# Patient Record
Sex: Male | Born: 1955 | Race: White | Hispanic: No | Marital: Married | Smoking: Never smoker
Health system: Southern US, Community
[De-identification: ages and names within clinical notes are randomized; demographics above are authoritative.]

## PROBLEM LIST (undated history)

## (undated) DIAGNOSIS — Z973 Presence of spectacles and contact lenses: Secondary | ICD-10-CM

## (undated) DIAGNOSIS — E785 Hyperlipidemia, unspecified: Secondary | ICD-10-CM

## (undated) DIAGNOSIS — D126 Benign neoplasm of colon, unspecified: Secondary | ICD-10-CM

## (undated) DIAGNOSIS — R011 Cardiac murmur, unspecified: Secondary | ICD-10-CM

## (undated) DIAGNOSIS — C801 Malignant (primary) neoplasm, unspecified: Secondary | ICD-10-CM

## (undated) DIAGNOSIS — K5792 Diverticulitis of intestine, part unspecified, without perforation or abscess without bleeding: Secondary | ICD-10-CM

## (undated) DIAGNOSIS — G4700 Insomnia, unspecified: Secondary | ICD-10-CM

## (undated) DIAGNOSIS — F988 Other specified behavioral and emotional disorders with onset usually occurring in childhood and adolescence: Secondary | ICD-10-CM

## (undated) DIAGNOSIS — H409 Unspecified glaucoma: Secondary | ICD-10-CM

## (undated) DIAGNOSIS — H9190 Unspecified hearing loss, unspecified ear: Secondary | ICD-10-CM

## (undated) DIAGNOSIS — T7840XA Allergy, unspecified, initial encounter: Secondary | ICD-10-CM

## (undated) DIAGNOSIS — J302 Other seasonal allergic rhinitis: Secondary | ICD-10-CM

## (undated) DIAGNOSIS — N433 Hydrocele, unspecified: Secondary | ICD-10-CM

## (undated) DIAGNOSIS — W19XXXA Unspecified fall, initial encounter: Secondary | ICD-10-CM

## (undated) HISTORY — DX: Diverticulitis of intestine, part unspecified, without perforation or abscess without bleeding: K57.92

## (undated) HISTORY — DX: Unspecified fall, initial encounter: W19.XXXA

## (undated) HISTORY — DX: Other specified behavioral and emotional disorders with onset usually occurring in childhood and adolescence: F98.8

## (undated) HISTORY — DX: Unspecified glaucoma: H40.9

## (undated) HISTORY — DX: Allergy, unspecified, initial encounter: T78.40XA

## (undated) HISTORY — DX: Insomnia, unspecified: G47.00

## (undated) HISTORY — DX: Benign neoplasm of colon, unspecified: D12.6

## (undated) HISTORY — DX: Cardiac murmur, unspecified: R01.1

## (undated) HISTORY — DX: Other seasonal allergic rhinitis: J30.2

## (undated) HISTORY — DX: Unspecified hearing loss, unspecified ear: H91.90

## (undated) HISTORY — DX: Hyperlipidemia, unspecified: E78.5

## (undated) HISTORY — PX: FRACTURE SURGERY: SHX138

## (undated) HISTORY — PX: COLON SURGERY: SHX602

## (undated) HISTORY — DX: Hydrocele, unspecified: N43.3

## (undated) HISTORY — PX: POLYPECTOMY: SHX149

## (undated) HISTORY — PX: WISDOM TOOTH EXTRACTION: SHX21

---

## 2001-12-07 ENCOUNTER — Encounter: Payer: Self-pay | Admitting: Internal Medicine

## 2004-12-24 ENCOUNTER — Ambulatory Visit: Payer: Self-pay | Admitting: Family Medicine

## 2005-12-12 ENCOUNTER — Ambulatory Visit: Payer: Self-pay | Admitting: Family Medicine

## 2009-02-16 DIAGNOSIS — W19XXXA Unspecified fall, initial encounter: Secondary | ICD-10-CM

## 2009-02-16 HISTORY — DX: Unspecified fall, initial encounter: W19.XXXA

## 2009-03-07 ENCOUNTER — Inpatient Hospital Stay (HOSPITAL_COMMUNITY): Admission: EM | Admit: 2009-03-07 | Discharge: 2009-03-10 | Payer: Self-pay

## 2009-03-08 ENCOUNTER — Ambulatory Visit: Payer: Self-pay | Admitting: Physical Medicine & Rehabilitation

## 2009-04-19 ENCOUNTER — Encounter: Payer: Self-pay | Admitting: Internal Medicine

## 2009-05-17 ENCOUNTER — Encounter: Payer: Self-pay | Admitting: Internal Medicine

## 2009-06-07 ENCOUNTER — Encounter: Payer: Self-pay | Admitting: Internal Medicine

## 2009-06-13 ENCOUNTER — Encounter: Admission: RE | Admit: 2009-06-13 | Discharge: 2009-08-09 | Payer: Self-pay | Admitting: Orthopedic Surgery

## 2009-07-17 ENCOUNTER — Encounter: Payer: Self-pay | Admitting: Internal Medicine

## 2009-10-02 ENCOUNTER — Ambulatory Visit: Payer: Self-pay | Admitting: Family Medicine

## 2009-11-18 HISTORY — PX: HIP SURGERY: SHX245

## 2009-11-18 HISTORY — PX: OTHER SURGICAL HISTORY: SHX169

## 2009-11-18 HISTORY — PX: WRIST SURGERY: SHX841

## 2009-12-07 ENCOUNTER — Ambulatory Visit: Payer: Self-pay | Admitting: Internal Medicine

## 2009-12-08 ENCOUNTER — Encounter (INDEPENDENT_AMBULATORY_CARE_PROVIDER_SITE_OTHER): Payer: Self-pay | Admitting: *Deleted

## 2009-12-10 LAB — CONVERTED CEMR LAB
ALT: 34 units/L (ref 0–53)
AST: 31 units/L (ref 0–37)
BUN: 18 mg/dL (ref 6–23)
Cholesterol: 219 mg/dL — ABNORMAL HIGH (ref 0–200)
Creatinine, Ser: 1 mg/dL (ref 0.4–1.5)
Eosinophils Relative: 0.7 % (ref 0.0–5.0)
GFR calc non Af Amer: 82.63 mL/min (ref >60)
HCT: 46.9 % (ref 39.0–52.0)
HDL: 70.5 mg/dL (ref >39.00)
Monocytes Relative: 9.1 % (ref 3.0–12.0)
Neutrophils Relative %: 55.9 % (ref 43.0–77.0)
Platelets: 233 10*3/uL (ref 150.0–400.0)
Potassium: 4.3 meq/L (ref 3.5–5.1)
Triglycerides: 59 mg/dL (ref 0.0–149.0)
WBC: 4.7 10*3/uL (ref 4.5–10.5)

## 2009-12-19 ENCOUNTER — Ambulatory Visit (HOSPITAL_COMMUNITY): Admission: RE | Admit: 2009-12-19 | Discharge: 2009-12-19 | Payer: Self-pay | Admitting: Internal Medicine

## 2009-12-19 ENCOUNTER — Encounter: Payer: Self-pay | Admitting: Internal Medicine

## 2009-12-19 ENCOUNTER — Ambulatory Visit: Payer: Self-pay | Admitting: Internal Medicine

## 2009-12-19 ENCOUNTER — Ambulatory Visit: Payer: Self-pay

## 2009-12-22 ENCOUNTER — Encounter (INDEPENDENT_AMBULATORY_CARE_PROVIDER_SITE_OTHER): Payer: Self-pay | Admitting: *Deleted

## 2009-12-22 ENCOUNTER — Encounter (INDEPENDENT_AMBULATORY_CARE_PROVIDER_SITE_OTHER): Payer: Self-pay

## 2009-12-26 ENCOUNTER — Encounter (INDEPENDENT_AMBULATORY_CARE_PROVIDER_SITE_OTHER): Payer: Self-pay | Admitting: *Deleted

## 2010-01-01 ENCOUNTER — Encounter (INDEPENDENT_AMBULATORY_CARE_PROVIDER_SITE_OTHER): Payer: Self-pay

## 2010-01-02 ENCOUNTER — Ambulatory Visit: Payer: Self-pay | Admitting: Internal Medicine

## 2010-01-16 ENCOUNTER — Ambulatory Visit: Payer: Self-pay | Admitting: Internal Medicine

## 2010-01-16 LAB — HM COLONOSCOPY

## 2010-01-18 ENCOUNTER — Encounter: Payer: Self-pay | Admitting: Internal Medicine

## 2010-12-18 NOTE — Letter (Signed)
Summary: Results Follow up Letter  [facility]  [address] [place], [place] [address]   Phone: [phone]  Fax: [phone]    12/22/2009 MRN: [id number]  [name] [address] [place], [place]  [address]  Dear [name],  The following are the results of your recent test(s):  Test         Result    Pap Smear:        Normal _____  Not Normal _____ Comments: ______________________________________________________ Cholesterol: LDL(Bad cholesterol):         Your goal is less than:         HDL (Good cholesterol):       Your goal is more than: Comments:  ______________________________________________________ Mammogram:        Normal _____  Not Normal _____ Comments:  ___________________________________________________________________ Hemoccult:        Normal _____  Not normal _______ Comments:    Your ECHO was normal. Please call me if you have any questions or concerns. Alena Bills [phone] ext [phone]

## 2010-12-18 NOTE — Letter (Signed)
Summary: Previsit letter  [facility]  [address] [place], [place] [address]   Phone: [phone]  Fax: [phone]       12/26/2009 MRN: [id number]  [name] [address] [place], [place]  [address]  Dear [name],  Welcome to the Gastroenterology Division at [facility].    You are scheduled to see a nurse for your pre-procedure visit on 01/02/2010 at 1:30PM on the 3rd floor at [facility], [address]  We ask that you try to arrive at our office 15 minutes prior to your appointment time to allow for check-in.  Your nurse visit will consist of discussing your medical and surgical history, your immediate family medical history, and your medications.    Please bring a complete list of all your medications or, if you prefer, bring the medication bottles and we will list them.  We will need to be aware of both prescribed and over the counter drugs.  We will need to know exact dosage information as well.  If you are on blood thinners (Coumadin, Plavix, Aggrenox, Ticlid, etc.) please call our office today/prior to your appointment, as we need to consult with your physician about holding your medication.   Please be prepared to read and sign documents such as consent forms, a financial agreement, and acknowledgement forms.  If necessary, and with your consent, a friend or relative is welcome to sit-in on the nurse visit with you.  Please bring your insurance card so that we may make a copy of it.  If your insurance requires a referral to see a specialist, please bring your referral form from your primary care physician.  No co-pay is required for this nurse visit.     If you cannot keep your appointment, please call [phone] to cancel or reschedule prior to your appointment date.  This allows [place] the opportunity to schedule an appointment for another patient in need of care.    Thank you for choosing [facility] for your medical  needs.  We appreciate the opportunity to care for you.  Please visit [place] at our website  to learn more about our practice.                     Sincerely.                                                                                                                   The Gastroenterology Division

## 2010-12-18 NOTE — Letter (Signed)
Summary: [facility] Instructions  [facility]  [address] [place], [place] [address]   Phone: [phone]  Fax: [phone]       [name]    11-Mar-1956    MRN: [id number]        Procedure Day /Date: 01/16/10  Tuesday     Arrival Time:  1pm      Procedure Time: 2pm     Location of Procedure:                    _ x_  [facility] (4th Floor)   PREPARATION FOR COLONOSCOPY WITH MOVIPREP   Starting 5 days prior to your procedure _2/24/11 _ do not eat nuts, seeds, popcorn, corn, beans, peas,  salads, or any raw vegetables.  Do not take any fiber supplements (e.g. Metamucil, Citrucel, and Benefiber).  THE DAY BEFORE YOUR PROCEDURE         DATE:  01/16/10   DAY:  Monday  1.  Drink clear liquids the entire day-NO SOLID FOOD  2.  Do not drink anything colored red or purple.  Avoid juices with pulp.  No orange juice.  3.  Drink at least 64 oz. (8 glasses) of fluid/clear liquids during the day to prevent dehydration and help the prep work efficiently.  CLEAR LIQUIDS INCLUDE: Water Jello Ice Popsicles Tea (sugar ok, no milk/cream) Powdered fruit flavored drinks Coffee (sugar ok, no milk/cream) Gatorade Juice: apple, white grape, white cranberry  Lemonade Clear bullion, consomm, broth Carbonated beverages (any kind) Strained chicken noodle soup Hard Candy                             4.  In the morning, mix first dose of MoviPrep solution:    Empty 1 Pouch A and 1 Pouch B into the disposable container    Add lukewarm drinking water to the top line of the container. Mix to dissolve    Refrigerate (mixed solution should be used within 24 hrs)  5.  Begin drinking the prep at 5:00 p.m. The MoviPrep container is divided by 4 marks.   Every 15 minutes drink the solution down to the next mark (approximately 8 oz) until the full liter is complete.   6.  Follow completed prep with 16 oz of clear liquid of your choice (Nothing red or purple).  Continue to  drink clear liquids until bedtime.  7.  Before going to bed, mix second dose of MoviPrep solution:    Empty 1 Pouch A and 1 Pouch B into the disposable container    Add lukewarm drinking water to the top line of the container. Mix to dissolve    Refrigerate  THE DAY OF YOUR PROCEDURE      DATE:  01/16/10  DAY:  Tuesday  Beginning at   9:00 a.m. (5 hours before procedure):         1. Every 15 minutes, drink the solution down to the next mark (approx 8 oz) until the full liter is complete.  2. Follow completed prep with 16 oz. of clear liquid of your choice.    3. You may drink clear liquids until  12 noon   (2 HOURS BEFORE PROCEDURE).   MEDICATION INSTRUCTIONS  Unless otherwise instructed, you should take regular prescription medications with a small sip of water   as early as possible the morning of your procedure.  OTHER INSTRUCTIONS  You will need a responsible adult at least 56 years of age to accompany you and drive you home.   This person must remain in the waiting room during your procedure.  Wear loose fitting clothing that is easily removed.  Leave jewelry and other valuables at home.  However, you may wish to bring a book to read or  an iPod/MP3 player to listen to music as you wait for your procedure to start.  Remove all body piercing jewelry and leave at home.  Total time from sign-in until discharge is approximately 2-3 hours.  You should go home directly after your procedure and rest.  You can resume normal activities the  day after your procedure.  The day of your procedure you should not:   Drive   Make legal decisions   Operate machinery   Drink alcohol   Return to work  You will receive specific instructions about eating, activities and medications before you leave.    The above instructions have been reviewed and explained to me by   Ulis Rias RN  January 02, 2010 1:47 PM     I fully understand and can verbalize these  instructions _____________________________ Date _________

## 2010-12-18 NOTE — Letter (Signed)
Summary: [facility] No Show Letter  [facility]  [address] [place], [place] [address]   Phone: [phone]  Fax: [phone]      December 22, 2009 MRN: [id number]   MONIQUE GIFT [address] [place], [place]  [address]      You were scheduled for an endoscopic procedure with Dr. Yancey Flemings   on  Tuesday 01/02/10   at the [facility] but you did not keep the appointment.    Your provider recommended this procedure for the benefit of your health.  It is very important that you reschedule it.  Failure to do so may be to the detriment of your health.  Please call us at [phone] and we will be happy to assist you with rescheduling.    If you were referred for this procedure by another physician/provider, we will notify him/her that you did not keep your appointment.   Sincerely, Doristine Church, RN  [facility]  Appended Document: [facility] No Show Letter December 22, 2009 MRN: [id number]   VALMORE ARABIE [address] [place] [phone]      You were scheduled for an endoscopic procedure with Dr. Yancey Flemings   on  Tuesday 01/02/10   at the [facility] but you did not keep the appointment.    Your provider recommended this procedure for the benefit of your health.  It is very important that you reschedule it.  Failure to do so may be to the detriment of your health.  Please call us at [phone] and we will be happy to assist you with rescheduling.    If you were referred for this procedure by another physician/provider, we will notify him/her that you did not keep your appointment.   Sincerely, Doristine Church, RN  [facility]   CHANGE OF ADDRESS

## 2010-12-18 NOTE — Miscellaneous (Signed)
Summary: [facility] previsit  Clinical Lists Changes  Medications: Added new medication of MOVIPREP 100 GM  SOLR (PEG-KCL-NACL-NASULF-NA ASC-C) As per prep instructions. - Signed Rx of MOVIPREP 100 GM  SOLR (PEG-KCL-NACL-NASULF-NA ASC-C) As per prep instructions.;  #1 x 0;  Signed;  Entered by: Ulis Rias RN;  Authorized by: Hilarie Fredrickson MD;  Method used: Electronically to [id number] [address] [place], [place], [place]  [address], Ph: [phone], Fax: [phone] Observations: Added new observation of NKA: T (01/02/2010 13:12)    Prescriptions: MOVIPREP 100 GM  SOLR (PEG-KCL-NACL-NASULF-NA ASC-C) As per prep instructions.  #1 x 0   Entered by:   Ulis Rias RN   Authorized by:   Hilarie Fredrickson MD   Signed by:   Ulis Rias RN on 01/02/2010   Method used:   Electronically to        [id number] (retail)       [address] [place]       [place], [place]  [address]       Ph: [phone]       Fax: [phone]   RxID:   [id number]

## 2010-12-18 NOTE — Assessment & Plan Note (Signed)
Summary: CPX AND FASTING LABS////SPH  Flu Vaccine Consent Questions     Do you have a history of severe allergic reactions to this vaccine? no    Any prior history of allergic reactions to egg and/or gelatin? no    Do you have a sensitivity to the preservative Thimersol? no    Do you have a past history of Guillan-Barre Syndrome? no    Do you currently have an acute febrile illness? no    Have you ever had a severe reaction to latex? no    Vaccine information given and explained to patient? yes    Are you currently pregnant? no    Lot Number:[id number]   Exp Date:05/17/2010   Site Given  rght Deltoid IM Shary Decamp  December 07, 2009 9:45 AM   Vital Signs:  Patient profile:   55 year old male Height:      71.75 inches Weight:      189.8 pounds Pulse rate:   76 / minute BP sitting:   120 / 72  Vitals Entered By: Dena Billet  CC: cpx, fasting Is Patient Diabetic? 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 CV:  Denies chest pain or discomfort and swelling of feet. Resp:  Denies cough and shortness of breath. GI:  Denies bloody stools, nausea, and vomiting. GU:  Denies dysuria, hematuria, urinary frequency, and urinary hesitancy.  Physical Exam  General:  alert, well-developed, and well-nourished.   Neck:  no thyromegaly.   Lungs:  normal respiratory effort, no intercostal retractions, no accessory muscle use, and normal breath sounds.   Heart:  normal rate, regular rhythm, no murmur, and no gallop.   Abdomen:  soft, non-tender, no distention, no masses, no guarding, and no rigidity.   Rectal:  No external abnormalities noted. Normal sphincter tone. No rectal masses or tenderness. Prostate:  Prostate gland firm and smooth, no enlargement, nodularity, tenderness, mass, asymmetry or induration.   Impression & Recommendations:  Problem # 1:  ROUTINE GENERAL MEDICAL EXAM@HEALTH  CARE FACL (ICD-V70.0) continue healthy lifestyle Td 2010 flu shot today Cscope-- remote, "in college", interested in one ----> referal   Orders: Venipuncture ([address]) TLB-BMP (Basic Metabolic Panel-BMET) (80048-METABOL) TLB-CBC Platelet - w/Differential (85025-CBCD) TLB-ALT (SGPT) (84460-ALT) TLB-AST (SGOT) (84450-SGOT) TLB-Lipid Panel (80061-LIPID) TLB-TSH (Thyroid Stimulating Hormone) (84443-TSH) TLB-PSA (Prostate Specific Antigen) (84153-PSA) Gastroenterology Referral (GI)  Problem # 2:  MITRAL INSUFFICIENCY (ICD-396.3)  h/o MR ECHO 2003 "thickened and somehow myxomatous MV , mild MR" will repeat a ECHO  although no obvious murmur today  Orders: Cardiology Referral (Cardiology)  Complete Medication List: 1)  Adderall 30 Mg Tabs (Amphetamine-dextroamphetamine) .[name].. 1 by mouth daily 2)  Trazodone Hcl 100 Mg Tabs (Trazodone hcl) .... 1/2 as needed sleep  Other Orders: Admin 1st Vaccine ([address]) Flu Vaccine 70yrs + ([address])  Patient  Instructions: 1)  Please schedule a follow-up appointment in 1 year.    Immunization History:  Tetanus/Td Immunization History:    Tetanus/Td:  tdap (02/16/2009)

## 2010-12-18 NOTE — Letter (Signed)
Summary: Patient Notice- Polyp Results  [facility]  [address] [place], [place] [address]   Phone: [phone]  Fax: [phone]        January 18, 2010 MRN: [id number]    [name] [address] [place], [place]  [address]    Dear [name],  I am pleased to inform you that the colon polyp(s) removed during your recent colonoscopy was (were) found to be benign (no cancer detected) upon pathologic examination.  I recommend you have a repeat colonoscopy examination in 3 years to look for recurrent polyps, as having colon polyps increases your risk for having recurrent polyps or even colon cancer in the future.  Should you develop new or worsening symptoms of abdominal pain, bowel habit changes or bleeding from the rectum or bowels, please schedule an evaluation with either your primary care physician or with me.  Additional information/recommendations:  __ No further action with gastroenterology is needed at this time. Please      follow-up with your primary care physician for your other healthcare      needs.  Please call us if you are having persistent problems or have questions about your condition that have not been fully answered at this time.  Sincerely,  Hilarie Fredrickson MD  This letter has been electronically signed by your physician.  Appended Document: Patient Notice- Polyp Results letter mailed 3.7.11

## 2010-12-18 NOTE — Procedures (Signed)
Summary: Colonoscopy  Patient: [name] Note: All result statuses are Final unless otherwise noted.  Tests: (1) Colonoscopy (COL)   COL Colonoscopy           DONE     [facility]     [address]     [place], [place]  [address]           COLONOSCOPY PROCEDURE REPORT           PATIENT:  Darryl, Torres  MR#:  [id number]     BIRTHDATE:  June 01, 1956, 53 yrs. old  GENDER:  male           ENDOSCOPIST:  Wilhemina Bonito. Eda Keys, MD     Referred by:  Willow Ora, M.D.           PROCEDURE DATE:  01/16/2010     PROCEDURE:  Colonoscopy with snare polypectomy x 6     ASA CLASS:  Class II     INDICATIONS:  Routine Risk Screening           MEDICATIONS:   Fentanyl 75 mcg IV, Versed 9 mg IV           DESCRIPTION OF PROCEDURE:   After the risks benefits and     alternatives of the procedure were thoroughly explained, informed     consent was obtained.  Digital rectal exam was performed and     revealed no abnormalities.   The LB [id number] endoscope     was introduced through the anus and advanced to the cecum, which     was identified by both the appendix and ileocecal valve, without     limitations.Time to cecum = 3:20 min. The quality of the prep was     excellent, using MoviPrep.  The instrument was then slowly     withdrawn (time = 19:05 min) as the colon was fully examined.     <<PROCEDUREIMAGES>>           FINDINGS:  There were multiple (6) polyps identified throughout     the colon and removed. 5 Polyps (cecal 1mm, ascending 39mm,3mm,     transverse 7mm, and descending 4mm) were snared without cautery.     Retrieval was successful. An 11mm pedunculated sigmoid colon polyp     was removed with snare cautery and retrieved.  Moderate     diverticulosis was found in the sigmoid colon.  This was otherwise     a normal examination of the colon.   Retroflexed views in the     rectum revealed no abnormalities.    The scope was then withdrawn     from the patient and the procedure  completed.           COMPLICATIONS:  None           ENDOSCOPIC IMPRESSION:     1) Polyps, multiple (6) - removed     2) Moderate diverticulosis in the sigmoid colon     3) Otherwise normal examination           RECOMMENDATIONS:     1) Follow up colonoscopy in 3 years           ______________________________     Wilhemina Bonito. Eda Keys, MD           CC:  Willow Ora, MD; The Patient           n.     eSIGNED:   Wilhemina Bonito. Eda Keys at  01/16/2010 03:03 PM           Harrison Mons, [id number]  Note: An exclamation mark (!) indicates a result that was not dispersed into the flowsheet. Document Creation Date: 01/16/2010 3:04 PM _______________________________________________________________________  (1) Order result status: Final Collection or observation date-time: 01/16/2010 14:53 Requested date-time:  Receipt date-time:  Reported date-time:  Referring Physician:   Ordering Physician: Fransico Setters [phone]) Specimen Source:  Source: Launa Grill Order Number: [id number] Lab site:   Appended Document: Colonoscopy recall in 3 yrs/01-2013     Procedures Next Due Date:    Colonoscopy: 01/2013

## 2010-12-18 NOTE — Letter (Signed)
Summary: Previsit letter  [facility]  [address] [place], [place] [address]   Phone: [phone]  Fax: [phone]       12/08/2009 MRN: [id number]  [name] [address] [place], [place]  [address]  Dear [name],  Welcome to the Gastroenterology Division at [facility].    You are scheduled to see a nurse for your pre-procedure visit on 12-22-09 at 3:30PM on the 3rd floor at [facility], [address]  We ask that you try to arrive at our office 15 minutes prior to your appointment time to allow for check-in.  Your nurse visit will consist of discussing your medical and surgical history, your immediate family medical history, and your medications.    Please bring a complete list of all your medications or, if you prefer, bring the medication bottles and we will list them.  We will need to be aware of both prescribed and over the counter drugs.  We will need to know exact dosage information as well.  If you are on blood thinners (Coumadin, Plavix, Aggrenox, Ticlid, etc.) please call our office today/prior to your appointment, as we need to consult with your physician about holding your medication.   Please be prepared to read and sign documents such as consent forms, a financial agreement, and acknowledgement forms.  If necessary, and with your consent, a friend or relative is welcome to sit-in on the nurse visit with you.  Please bring your insurance card so that we may make a copy of it.  If your insurance requires a referral to see a specialist, please bring your referral form from your primary care physician.  No co-pay is required for this nurse visit.     If you cannot keep your appointment, please call [phone] to cancel or reschedule prior to your appointment date.  This allows [place] the opportunity to schedule an appointment for another patient in need of care.    Thank you for choosing [facility] for your medical needs.   We appreciate the opportunity to care for you.  Please visit [place] at our website  to learn more about our practice.                     Sincerely.                                                                                                                   The Gastroenterology Division

## 2011-02-27 LAB — DIFFERENTIAL
Basophils Absolute: 0 10*3/uL (ref 0.0–0.1)
Eosinophils Relative: 0 % (ref 0–5)
Lymphocytes Relative: 15 % (ref 12–46)
Lymphs Abs: 1.9 10*3/uL (ref 0.7–4.0)
Neutrophils Relative %: 81 % — ABNORMAL HIGH (ref 43–77)

## 2011-02-27 LAB — CBC
HCT: 35 % — ABNORMAL LOW (ref 39.0–52.0)
HCT: 36.9 % — ABNORMAL LOW (ref 39.0–52.0)
HCT: 37.5 % — ABNORMAL LOW (ref 39.0–52.0)
HCT: 46.9 % (ref 39.0–52.0)
Hemoglobin: 12 g/dL — ABNORMAL LOW (ref 13.0–17.0)
Hemoglobin: 12.8 g/dL — ABNORMAL LOW (ref 13.0–17.0)
Hemoglobin: 13.1 g/dL (ref 13.0–17.0)
Hemoglobin: 16.1 g/dL (ref 13.0–17.0)
MCHC: 34.8 g/dL (ref 30.0–36.0)
Platelets: 224 10*3/uL (ref 150–400)
RBC: 3.92 MIL/uL — ABNORMAL LOW (ref 4.22–5.81)
RBC: 4 MIL/uL — ABNORMAL LOW (ref 4.22–5.81)
RBC: 4.97 MIL/uL (ref 4.22–5.81)
RDW: 13.1 % (ref 11.5–15.5)
RDW: 13.3 % (ref 11.5–15.5)
WBC: 9.8 10*3/uL (ref 4.0–10.5)

## 2011-02-27 LAB — COMPREHENSIVE METABOLIC PANEL
ALT: 34 U/L (ref 0–53)
AST: 44 U/L — ABNORMAL HIGH (ref 0–37)
Albumin: 3.6 g/dL (ref 3.5–5.2)
CO2: 23 meq/L (ref 19–32)
Calcium: 8.9 mg/dL (ref 8.4–10.5)
Chloride: 107 meq/L (ref 96–112)
Creatinine, Ser: 1.15 mg/dL (ref 0.4–1.5)
GFR calc Af Amer: >60 mL/min (ref >60)
Sodium: 139 meq/L (ref 135–145)
Total Bilirubin: 0.9 mg/dL (ref 0.3–1.2)

## 2011-02-27 LAB — BASIC METABOLIC PANEL
CO2: 26 mEq/L (ref 19–32)
Calcium: 8 mg/dL — ABNORMAL LOW (ref 8.4–10.5)
Chloride: 102 mEq/L (ref 96–112)
Chloride: 103 mEq/L (ref 96–112)
Chloride: 105 mEq/L (ref 96–112)
GFR calc Af Amer: >60 mL/min (ref >60)
GFR calc non Af Amer: >60 mL/min (ref >60)
GFR calc non Af Amer: >60 mL/min (ref >60)
Glucose, Bld: 118 mg/dL — ABNORMAL HIGH (ref 70–99)
Potassium: 3.5 mEq/L (ref 3.5–5.1)
Potassium: 3.8 mEq/L (ref 3.5–5.1)
Potassium: 3.8 mEq/L (ref 3.5–5.1)
Sodium: 132 mEq/L — ABNORMAL LOW (ref 135–145)
Sodium: 134 mEq/L — ABNORMAL LOW (ref 135–145)
Sodium: 136 mEq/L (ref 135–145)

## 2011-02-27 LAB — URINALYSIS, ROUTINE W REFLEX MICROSCOPIC
Bilirubin Urine: NEGATIVE
Hgb urine dipstick: NEGATIVE
Ketones, ur: 15 mg/dL — AB
Specific Gravity, Urine: 1.025 (ref 1.005–1.030)
pH: 5.5 (ref 5.0–8.0)

## 2011-02-27 LAB — URINE MICROSCOPIC-ADD ON

## 2011-02-27 LAB — PROTIME-INR: INR: 1.1 (ref 0.00–1.49)

## 2011-04-02 NOTE — H&P (Signed)
[name], [name] NO.:  [id number]   MEDICAL RECORD NO.:  [id number]          PATIENT TYPE:  INP   LOCATION:  5003                         FACILITY:  [facility]   PHYSICIAN:  Gabrielle Dare. Janee Morn, M.D.DATE OF BIRTH:  [birth year]   DATE OF ADMISSION:  03/07/2009  DATE OF DISCHARGE:                              HISTORY & PHYSICAL   CHIEF COMPLAINT:  Left wrist and primary left hip pain after fall from a  ladder.   HISTORY OF PRESENT ILLNESS:  The patient is a 55 year old white male who  was up cleaning the second-story gutters on an extension ladder when he  fell.  He is amnestic to event; however, he was able, somehow to seek  help.  He came in as a level II trauma activation.  He is complaining of  some headache, left wrist pain, left hip pain, and muscular back pain.  Workup at the emergency department demonstrated a significant left ear  laceration, left wrist fracture, and left hip fracture and symptoms  consistent with concussion.  We were asked to evaluate for admission to  the Trauma Service.   PAST MEDICAL HISTORY:  ADD and seasonal allergies.   PAST SURGICAL HISTORY:  None.   SOCIAL HISTORY:  Denies drug use.  Does not smoke.  Does drink alcohol  approximately 2 drinks per day.  He is currently unemployed.  He lives  with his wife.   ALLERGIES:  No known drug allergies.   MEDICATIONS:  Trazodone nightly, unknown dose.  He also takes Adderall  and Zyrtec.   PRIMARY CARE PHYSICIAN:  Willow Ora, MD   REVIEW OF SYSTEMS:  MUSCULOSKELETAL:  Complaints, as above.  NEUROLOGIC:  He is amnestic to the event and does complain of some headache.  SKIN:  Soft tissue, has left ear laceration, otherwise review is negative.   PHYSICAL EXAMINATION:  VITAL SIGNS:  Temperature is 98.7, pulse 94,  respirations 18, blood pressure 134/85, saturations 99% on room air.  HEENT:  Head is normocephalic.  Eyes, pupils are equal and reactive.  Extraocular muscles are intact.   Oral mucosa is moist.  The ear exam  demonstrates left auricle laceration that has been repaired by Dr.  Pollyann Kennedy.  Face is symmetric and nontender.  NECK:  No posterior midline tenderness or step-off.  There is no pain  with active range of motion.  In addition to neck exam, his neck has  been clinically cleared already by Dr. Fonnie Jarvis from the emergency  department.  PULMONARY:  Lungs are clear to auscultation.  No wheezing is heard.  Respiratory effort is normal.  CARDIOVASCULAR:  Heart is regular with no murmurs.  Impulse is palpable  on the left chest.  Distal pulses are 2+ with no peripheral edema.  ABDOMEN:  Soft and nontender.  No organomegaly is noted.  No masses are  felt.  Bowel sounds are hypoactive.  PELVIS:  He has tenderness over the left hip.  MUSCULOSKELETAL:  He has tenderness with deformity of the left wrist and  the hip tenderness as described above.  EXTREMITIES:  Right  upper and lower extremities are nontender.  BACK:  Has some mild muscular tenderness in the paraspinous muscle, but  there is no midline tenderness or step-off.  NEUROLOGIC:  He is amnestic to the event.  The Glasgow coma scale at  this time is 15.   LABORATORY STUDIES:  Sodium 139, potassium 4.2, chloride 107, CO2 of 23,  BUN 18, creatinine 1.2, and glucose 134.  White blood cell count 13.2,  hemoglobin 16.1, and platelets 224.  Chest x-ray negative.  Pelvis x-ray  shows left femoral neck fracture.  Left upper extremity film shows  distal radius fracture.  CT scan of the head is negative.  CT scan of  the abdomen and pelvis is negative.   IMPRESSION:  A 55 year old, status post fall with;  1. Left ear laceration.  2. Left femoral neck fracture.  3. Left radius fracture.  4. Concussion.  5. Attention deficit disorder.  6. Seasonal allergies.   PLAN:  To admit him to the Trauma Service.  Ortho and ENT consults are  in progress.  Dr. Pollyann Kennedy has already repaired his ear and Dr. Dion Saucier has  seen the  patient and plans to take him to the operating room for open  reduction and internal fixation of his femoral neck fracture and left  radius fracture.  Plan was discussed in detail with the patient.      Gabrielle Dare Janee Morn, M.D.  Electronically Signed     BET/MEDQ  D:  03/07/2009  T:  03/08/2009  Job:  [id number]   cc:   Jeannett Senior. Pollyann Kennedy, MD  Eulas Post, MD  Willow Ora, MD

## 2011-04-02 NOTE — Consult Note (Signed)
[name], [name]                   ACCOUNT NO.:  [id number]   MEDICAL RECORD NO.:  [id number]          PATIENT TYPE:  INP   LOCATION:  5003                         FACILITY:  [facility]   PHYSICIAN:  Jefry H. Pollyann Kennedy, MD     DATE OF BIRTH:  Jun 29, 1956   DATE OF CONSULTATION:  03/07/2009  DATE OF DISCHARGE:                                 CONSULTATION   REASON FOR CONSULTATION:  Trauma to the left ear.   HISTORY:  This is a 55 year old gentleman who was working up on a ladder  earlier today and he fell.  He sustained a fractured hip and some injury  to his wrist and an avulsion injury to his left ear and a laceration of  the left postauricular scalp.  He is, otherwise, in good health.   PHYSICAL EXAMINATION:  He is a healthy-appearing gentleman, lying  supine, in discomfort from pain from his injuries.  He is able to  communicate well and is answering questions appropriately.  He denies  any loss of hearing.  Ear canal and drum, middle ear exam on the left  are normal.  Facial exam reveals no evidence of additional injury other  than the left ear and left postauricular scalp.  Oral cavity and pharynx  are clear.  Nasal exam unremarkable.  The eyes look normal without any  obvious injury.  Examination of the left pinna reveals an avulsion  injury of the superior aspect of the pinna where the top of  approximately 1 cm of the pinna has been lacerated off with fractured  cartilage and attachment of the posterior helical skin of about a 8-9 mm  thickness.  There is additional macerated skin off the edge of this,  which appears to be nonviable.  The remaining pinna is unremarkable.  There is a 4-cm deep scalp laceration in the postauricular scalp.   IMPRESSION:  Complex ear laceration and scalp laceration.   PLAN:  To repair these under local anesthesia.   PROCEDURE NOTE:  The wounds were injected with 2% Xylocaine with  epinephrine.  Sterile draping was applied and the wounds were cleaned  thoroughly with Betadine solution.  A 5-0 chromic and 5-0 Rapide gut was  used to repair the 2 lacerations.  A simple laceration was performed on  the scalp with a running suture.  On the ear, the posteriorly based flap  was brought forward and reattached anteriorly to the top of the helix.  Running suture was used in segments to reapproximate the antihelical  edge as normally as possible, although there was missing skin and  cartilage in this area.  I was able to accomplish complete closure.  The  posterior closure was relatively simple and running suture was used.  The cartilage itself was all under viable tissue, so no trimming was  performed.  The attempt here is to preserve as much cartilage as  possible for possible future reconstructive surgery if necessary.  Bacitracin was applied.  The patient was then left to the care of the  emergency department staff to continue workup of the  multiple traumas.  He will follow up with me in 2 weeks or sooner if any problems arise.  He should remain on the antibiotic for about 5-7 days, although I  suspect he will need an even longer with other injuries.      Jefry H. Pollyann Kennedy, MD  Electronically Signed    JHR/MEDQ  D:  03/07/2009  T:  03/08/2009  Job:  [id number]

## 2011-04-02 NOTE — Op Note (Signed)
NAMEAXL, RODINO NO.:  [id number]   MEDICAL RECORD NO.:  [id number]          PATIENT TYPE:  INP   LOCATION:  5003                         FACILITY:  [facility]   PHYSICIAN:  Eulas Post, MD    DATE OF BIRTH:  1955/12/26   DATE OF PROCEDURE:  03/07/2009  DATE OF DISCHARGE:                               OPERATIVE REPORT   PREOPERATIVE DIAGNOSES:  Left femoral neck fracture and left comminuted  intraarticular distal radius fracture.   POSTOPERATIVE DIAGNOSES:  Left femoral neck fracture and left comminuted  intraarticular distal radius fracture.   OPERATIVE PROCEDURE:  Left femoral neck percutaneous pinning and left  distal radius open reduction and internal fixation of more than 3 parts.   ANESTHESIA:  General.   ATTENDING SURGEON:  Eulas Post, MD   FIRST ASSISTANT:  Kirstin Shepperson, PA-C   ESTIMATED BLOOD LOSS:  100 mL for both operations.   OPERATIVE IMPLANTS:  For the hip, we used [employer] titanium cannulated  screws with a single sized 8.0 mm screw placed inferiorly, and two 6.5  mm screws placed superiorly.  For the wrist, we used a volar locking  plate from [employer] with a total of 2 proximal locking screws that were  unicortically placed and one nonlocking screw that was bicortically  placed on the proximal side, and multiple distal locking screws as well  as a nonlocking radial styloid screw.   PREOPERATIVE INDICATIONS:  Mr. Saiquan Hands is a 55 year old gentleman who  fell while trying to clean the gutters from his roof today.  He had a  minimally displaced valgus left femoral neck fracture and a comminuted  left distal radius fracture.  He elected to undergo the above named  procedures.  The risks, benefits, and alternatives were discussed with  the patient and the family preoperatively including but not limited to  the risks of infection, bleeding, nerve injury, malunion, nonunion,  avascular necrosis of the femoral head, limp, prolonged  recovery, the  need for conversion to hip arthroplasty, blood clots, cardiopulmonary  complications, hardware prominence, hardware failure, among others, and  on the wrist we discussed all of these similar complications, as well as  posttraumatic arthritis and the need for hardware removal as well tendon  rupture and injury.  The patient and the family were willing to proceed.   OPERATIVE PROCEDURE:  The patient was brought to the operating room,  placed in the supine position.  General anesthesia was administered.  Two grams of intravenous Ancef was given.  The patient was placed on  fracture table.  He was brought emergently to the operating room for  pinning of his hip.  After he was positioned on the fracture table,  gentle traction was applied, although, this was basically just enough to  suspend the leg.  A C-arm was used to confirm appropriate reduction of  the femoral neck.  We then prepped and draped the left hip in the usual  sterile fashion.  A small incision was made over the thigh and  percutaneous pins were introduced.  The guidewires were  placed taking  care not to violate the subtrochanteric bone in order to minimize stress  riser formation.  Single path was used for all placement of the pins.  We had a single distal pin and 2 proximal pins.  Appropriate screw  lengths were confirmed and we placed the screws across the fracture  site.  These had excellent fixation and overall bone quality was very  good.  We then irrigated the wound copiously and closed with 3-0 Vicryl  followed by 4-0 Monocryl and Steri-Strips to the skin.   We then turned our attention to the left wrist.  The left upper  extremity was prepped and draped in the usual sterile fashion.  Volar  approach to distal radius was carried out.  Care was taken to protect  the radial artery.  The pronator quadratus was incised sharply and  reflected.  The fracture segments on the volar side were extremely  distal,  and were actually almost exactly as the articular margin where  they exited on the volar side.  The pieces dorsally were fairly large,  however.  We were able reduce the wrist and hold it provisionally with a  K-wire.  We confirmed the reduction on AP and lateral views.  Both the  ulnar and radial doral segments were reduced using ligamentotaxis and  maintained.  We applied the volar plate.  I selected the wide plate in  order to optimize fixation of the ulnar fragment of the distal radius in  order to maintain anatomical alignment of the distal radioulnar joint.  We then secured the plate provisionally with a K-wire and secured it  with a locking screw in the sliding hole.  Appropriate position in  relation to the joint was confirmed on x-ray and we then secured the  distal pieces with locking screws.  Initially, we placed the nonlocking  screw into the radial styloid segment.  This applied excellent  compression to the distal radial fragments.  We then placed the locking  screws across the distal row as well as the more proximal row.  The more  proximal row ended approximately at the level of the dorsal fragments,  and so the bone is very weak dorsally there.  I did not go bicortically,  in order to minimize the risk of tendon injury.  We had excellent  buttress of the dorsal fragments and we then placed 2 locking screws  unicortically proximally in order to minimize tendon injury.  We then  irrigated the wounds copiously and closed the quadratus with Vicryl.  I  did remove the K-wire after testing the fixation under live fluoroscopy,  and the fixation was found to be very stable.  Therefore, we removed the  K-wire and irrigated the wounds copiously again and closed with Vicryl  followed by Monocryl and Steri-Strips with sterile gauze and a volar  splint.  The tourniquet was released.  Total tourniquet time was  approximately 1 hour and a half.  The patient tolerated the procedure  well.   There were no complication.  He will be touch-toe weightbearing  on the left lower extremity and weightbearing as tolerated on the left  upper extremity, but only the platform crutch.  He will be  nonweightbearing as far as this distal radius goes.      Eulas Post, MD  Electronically Signed     JPL/MEDQ  D:  03/07/2009  T:  03/08/2009  Job:  [id number]

## 2011-04-02 NOTE — Discharge Summary (Signed)
NAMEDAMARE, SERANO NO.:  [id number]   MEDICAL RECORD NO.:  [id number]          PATIENT TYPE:  INP   LOCATION:  5003                         FACILITY:  [facility]   PHYSICIAN:  Earney Hamburg, P.A.  DATE OF BIRTH:  22-Nov-1955   DATE OF ADMISSION:  03/07/2009  DATE OF DISCHARGE:  03/10/2009                               DISCHARGE SUMMARY   DISCHARGE DIAGNOSES:  1. Fall.  2. Concussion.  3. Left ear laceration.  4. Left distal radius fracture.  5. Left femoral neck fracture.  6. Attention deficit disorder.  7. Seasonal allergic rhinitis.  8. Insomnia.   CONSULTANTS:  1. Jefry H. Pollyann Kennedy, MD for ENT  2. Eulas Post, MD Orthopedic Surgery   PROCEDURES:  1. Closure of left ear laceration by Dr. Pollyann Kennedy.  2. Closed reduction with percutaneous pinning of left hip fracture.  3. Open reduction and internal fixation of left distal radius      fracture, both 2 and 3 done by Dr. Dion Saucier.   HISTORY OF PRESENT ILLNESS:  This is a 55 year old white male who was  cleaning a second story gutters on a ladder when the ladder slipped and  he fell.  He is amnestic to the event.  He came in as a silver trauma  alert complaining of headache upper and middle back pain, left wrist and  left hip pain.  Workup demonstrated the above-mentioned injuries.  Orthopedic Surgery and ENT were consulted.  Dr. Pollyann Kennedy from ENT sewed up  his ear in the emergency department.  Dr. Priscille Kluver planned to take the  patient for surgery later on that evening for fixation of his fractures.   HOSPITAL COURSE:  The patient did indeed have his fractures repaired  that evening by Dr. Dion Saucier.  His postoperative course was unremarkable  except for the fact that he progressed extremely well.  He was able to  have his pain controlled on oral medications and was able to be  discharged home in good condition to the care of his wife.   DISCHARGE MEDICATIONS:  1. Percocet 5/325, take 1-2 p.o. q.4 h. p.r.n. pain,  #60 with no      refill.  2. Robaxin 500 mg take 1-2 p.o. q.6 h. p.r.n. spasm, #90 with no      refill.   In addition, he is to resume his home medications, which include:  1. Trazodone 100 mg nightly for insomnia.  2. Adderall 30 mg two times daily for ADD.   FOLLOWUP:  The patient will need to follow up with Dr. Dion Saucier and Dr.  Pollyann Kennedy in their office, thus we will call for an appointment.  Follow up  with the trauma service will be on an as-needed basis.      Earney Hamburg, P.A.     MJ/MEDQ  D:  03/10/2009  T:  03/11/2009  Job:  [id number]   cc:   Jeannett Senior. Pollyann Kennedy, MD  Eulas Post, MD

## 2011-04-02 NOTE — H&P (Signed)
NAMEFRAZIER, BALFOUR NO.:  [id number]   MEDICAL RECORD NO.:  [id number]          PATIENT TYPE:  INP   LOCATION:  1846                         FACILITY:  [facility]   PHYSICIAN:  Eulas Post, MD    DATE OF BIRTH:  1956-05-05   DATE OF ADMISSION:  03/07/2009  DATE OF DISCHARGE:                              HISTORY & PHYSICAL   CHIEF COMPLAINT:  Left hip pain, left wrist pain.   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  He fell off his  ladder, does not remember this.  He remembers crawling to a chair to  call for help.  He was transported to [facility] ER.  At that time,  major findings were nondisplaced left femoral neck fracture, left distal  radius fracture, and significant left ear laceration.  Also, has  concussion.  He has been evaluated and admitted by Trauma.  He has no  abdominal trauma and no pelvic fractures, just his left hip femoral neck  fracture.   ALLERGIES:  None.   CURRENT MEDICATIONS:  1. Trazodone p.o. at bedtime.  2. Adderall for ADD.  3. Zyrtec.   MEDICAL HISTORY:  Significant for ADD, seasonal allergies as well as  insomnia.   SOCIAL HISTORY:  The patient is married.  He is an unemployed Psychologist, occupational.   FAMILY HISTORY:  Significant for breast cancer and diabetes in his  mother, lung cancer in his dad.   REVIEW OF SYSTEMS:  Significant for left ear pain, amnesia, left wrist  pain, left hip pain, negative for all other 14 systems reviewed.   OBJECTIVE:  VITAL SIGNS:  On examination, blood pressure is 134/85, O2  sat is 99%, pulse is 94, temp is 98.7, respirations are 18.  GENERAL:  He is a well-nourished, well-developed 55 year old white male  who is alert and oriented x3.  NECK:  Supple.  CHEST:  Clear.  HEENT:  He is normocephalic.  He has laceration that has been set up by  general surgery on his left ear.  Pupils are equally round, reactive to  light and accommodation.  Extraocular movements are intact.  HEART:  Regular rate and rhythm without murmurs, rubs, or gallops.  ABDOMEN:  Soft, nontender with positive bowel sounds.  EXTREMITIES:  Evaluation of his extremities show left wrist is currently  in a splint.  He has 2+ radial pulses.  He has neurovascularly intact  left hip, has pain with log roll and a 2+ dorsalis pedis pulses.  SKIN:  He has multiple facial lacerations including a significant one on  his ear that has been closed by General Surgery.  PSYCHIATRIC:  The patient is alert.  His affect is appropriate.   ASSESSMENT:  1. Fall from 24 feet off a ladder.  2. Ear laceration.  3. Left hip femoral neck fracture.  4. Left wrist distal radius fracture.  5. Concussion.  6. Attention deficit disorder.  7. Insomnia.  8. Seasonal allergies.   PLAN:  At this point in time, the  patient is being admitted to Trauma.  We will get a CT of his hip.  It has shown his fracture is relatively  nondisplaced.  We are planning on taking him to the OR tonight for  emergent percutaneous pinning of his left hip as well as open reduction  and internal fixation of his left distal radius fracture.  He will be  admitted overnight for observation hopefully on a 5000 bed.  Risks,  benefits, and possible complications have been discussed in detail with  the patient and his wife.  These do include, but are not limited to  anesthesia risk, nerve or vessel damage, blood loss and infection with  specific recurrence to the left hip.  He does understand that his  fracture was __________.  There is a significant risk of development of  a necrosis acutely or long term and definitive treatment from this would  be a total hip replacement.  With these risks and benefits discussed  with the family and the patient, they understand these and are without  question.      Kirstin Shepperson, P.A.      Eulas Post, MD  Electronically Signed     KS/MEDQ  D:  03/07/2009  T:  03/08/2009  Job:  [id number]

## 2011-07-15 ENCOUNTER — Telehealth: Payer: Self-pay | Admitting: *Deleted

## 2011-07-15 ENCOUNTER — Encounter: Payer: Self-pay | Admitting: Internal Medicine

## 2011-07-15 ENCOUNTER — Ambulatory Visit (INDEPENDENT_AMBULATORY_CARE_PROVIDER_SITE_OTHER): Payer: BC Managed Care – PPO | Admitting: Internal Medicine

## 2011-07-15 VITALS — BP 102/72 | HR 109 | Temp 98.9°F | Resp 16 | Wt 190.0 lb

## 2011-07-15 DIAGNOSIS — R5383 Other fatigue: Secondary | ICD-10-CM | POA: Insufficient documentation

## 2011-07-15 NOTE — Telephone Encounter (Signed)
Same Day Abstraction. Has been Completed 07/15/11.

## 2011-07-15 NOTE — Progress Notes (Signed)
  Subjective:    Patient ID: [name], male    DOB: 1956/05/27, 55 y.o.   MRN: [id number]  HPI 2 months history of lack of energy, described just a generalized fatigue. Still able to do everything he likes but with less energy. Specifically denies any dyspnea on exertion, chest pain. He started taking the Allegra 2 weeks ago without apparent worsening of his symptoms. He sees his psychiatrist routinely, no change on psych medicines, symptoms well-controlled. Wonders if his testosterone needs to be checked.  Past Medical History  Diagnosis Date  . ADD (attention deficit disorder)     adult- per psych  . Insomnia     per psych  . Heart murmur     Echo 2003 "thickened and somehow myomateous MV, mild MR"  . Fall 4-10    fx left hip and wrist   Past Surgical History  Procedure Date  . Nose biopsy 2011    Dr Terri Piedra, no cancer  . Surgery for left hip and wrist      Review of Systems Denies any fever, weight loss. No anxiety or depression, no marital  issues or problems. Stress is at baseline. No chest pain or difficulty breathing No cough Takes trazodone as needed for sleep, he sleeps well, not feels sleepy throughout the day. Wife has told him that he doesn't snore on and off. Denies any headache or paresthesias No decreased libido or problems with erectile dysfunction.     Objective:   Physical Exam  Constitutional: He is oriented to person, place, and time. He appears well-developed and well-nourished. No distress.  HENT:  Head: Normocephalic and atraumatic.  Neck: No thyromegaly present.       No neck or supraclavicular lymphadenopathies  Cardiovascular: Normal rate, regular rhythm and normal heart sounds.   No murmur heard. Pulmonary/Chest: Effort normal and breath sounds normal. No respiratory distress. He has no wheezes. He has no rales.  Abdominal: Soft. Bowel sounds are normal. He exhibits no distension. There is no tenderness. There is no rebound and no guarding.         No organomegaly  Musculoskeletal: He exhibits no edema.  Neurological: He is alert and oriented to person, place, and time.  Skin: He is not diaphoretic.  Psychiatric: He has a normal mood and affect. His behavior is normal. Thought content normal.          Assessment & Plan:

## 2011-07-15 NOTE — Assessment & Plan Note (Addendum)
2 months history of fatigue with a essentially negative review of systems. Etiology unclear. Plan: Labs to rule out underlying etiologies. If labs negative, will recommend observation and reassessment in 3 months. Consider a sleep study to rule out sleep apnea. Patient also knows to call any time if symptoms get worse.

## 2011-07-16 LAB — COMPREHENSIVE METABOLIC PANEL
ALT: 37 U/L (ref 0–53)
CO2: 26 mEq/L (ref 19–32)
Calcium: 9 mg/dL (ref 8.4–10.5)
Chloride: 104 mEq/L (ref 96–112)
Creatinine, Ser: 1.2 mg/dL (ref 0.4–1.5)
GFR: 67.21 mL/min (ref >60.00)
Sodium: 139 mEq/L (ref 135–145)
Total Protein: 7.2 g/dL (ref 6.0–8.3)

## 2011-07-16 LAB — VITAMIN B12: Vitamin B-12: 363 pg/mL (ref 211–911)

## 2011-07-16 LAB — CBC WITH DIFFERENTIAL/PLATELET
Basophils Absolute: 0 10*3/uL (ref 0.0–0.1)
Lymphocytes Relative: 42.8 % (ref 12.0–46.0)
Monocytes Relative: 11.1 % (ref 3.0–12.0)
Platelets: 207 10*3/uL (ref 150.0–400.0)
RDW: 13.6 % (ref 11.5–14.6)

## 2011-07-16 LAB — TESTOSTERONE, FREE, TOTAL, SHBG: Testosterone-% Free: 1.9 % (ref 1.6–2.9)

## 2011-07-16 LAB — TSH: TSH: 2.63 u[IU]/mL (ref 0.35–5.50)

## 2011-07-16 LAB — FOLATE: Folate: 18.6 ng/mL (ref >5.9)

## 2011-10-17 ENCOUNTER — Encounter: Payer: BC Managed Care – PPO | Admitting: Internal Medicine

## 2012-09-11 ENCOUNTER — Emergency Department (HOSPITAL_COMMUNITY): Payer: BC Managed Care – PPO

## 2012-09-11 ENCOUNTER — Inpatient Hospital Stay (HOSPITAL_COMMUNITY)
Admission: EM | Admit: 2012-09-11 | Discharge: 2012-09-17 | DRG: 183 | Disposition: A | Discharge status: Home / Self Care | Payer: BC Managed Care – PPO | Attending: General Surgery | Admitting: General Surgery

## 2012-09-11 ENCOUNTER — Encounter (HOSPITAL_COMMUNITY): Payer: Self-pay | Admitting: Emergency Medicine

## 2012-09-11 ENCOUNTER — Encounter: Payer: Self-pay | Admitting: Internal Medicine

## 2012-09-11 ENCOUNTER — Ambulatory Visit (INDEPENDENT_AMBULATORY_CARE_PROVIDER_SITE_OTHER): Payer: BC Managed Care – PPO | Admitting: Internal Medicine

## 2012-09-11 VITALS — BP 128/82 | HR 125 | Temp 100.3°F | Wt 188.0 lb

## 2012-09-11 DIAGNOSIS — F988 Other specified behavioral and emotional disorders with onset usually occurring in childhood and adolescence: Secondary | ICD-10-CM | POA: Diagnosis present

## 2012-09-11 DIAGNOSIS — I059 Rheumatic mitral valve disease, unspecified: Secondary | ICD-10-CM | POA: Diagnosis present

## 2012-09-11 DIAGNOSIS — K5780 Diverticulitis of intestine, part unspecified, with perforation and abscess without bleeding: Secondary | ICD-10-CM

## 2012-09-11 DIAGNOSIS — Z8249 Family history of ischemic heart disease and other diseases of the circulatory system: Secondary | ICD-10-CM

## 2012-09-11 DIAGNOSIS — R109 Unspecified abdominal pain: Secondary | ICD-10-CM

## 2012-09-11 DIAGNOSIS — K5732 Diverticulitis of large intestine without perforation or abscess without bleeding: Secondary | ICD-10-CM

## 2012-09-11 DIAGNOSIS — G4700 Insomnia, unspecified: Secondary | ICD-10-CM | POA: Diagnosis present

## 2012-09-11 DIAGNOSIS — R5381 Other malaise: Secondary | ICD-10-CM | POA: Diagnosis not present

## 2012-09-11 DIAGNOSIS — Z79899 Other long term (current) drug therapy: Secondary | ICD-10-CM

## 2012-09-11 DIAGNOSIS — Z23 Encounter for immunization: Secondary | ICD-10-CM

## 2012-09-11 LAB — CBC WITH DIFFERENTIAL/PLATELET
Basophils Absolute: 0 10*3/uL (ref 0.0–0.1)
Basophils Relative: 0 % (ref 0–1)
Eosinophils Absolute: 0 10*3/uL (ref 0.0–0.7)
Eosinophils Relative: 0 % (ref 0–5)
HCT: 42.3 % (ref 39.0–52.0)
Hemoglobin: 14.4 g/dL (ref 13.0–17.0)
Lymphocytes Relative: 7 % — ABNORMAL LOW (ref 12–46)
Lymphs Abs: 1.3 10*3/uL (ref 0.7–4.0)
MCH: 31.1 pg (ref 26.0–34.0)
MCHC: 34 g/dL (ref 30.0–36.0)
MCV: 91.4 fL (ref 78.0–100.0)
Monocytes Absolute: 0.8 10*3/uL (ref 0.1–1.0)
Monocytes Relative: 4 % (ref 3–12)
Neutro Abs: 16.9 10*3/uL — ABNORMAL HIGH (ref 1.7–7.7)
Neutrophils Relative %: 89 % — ABNORMAL HIGH (ref 43–77)
Platelets: 229 10*3/uL (ref 150–400)
RBC: 4.63 MIL/uL (ref 4.22–5.81)
RDW: 12.7 % (ref 11.5–15.5)
WBC: 19 10*3/uL — ABNORMAL HIGH (ref 4.0–10.5)

## 2012-09-11 LAB — COMPREHENSIVE METABOLIC PANEL
ALT: 32 U/L (ref 0–53)
AST: 22 U/L (ref 0–37)
Albumin: 3.5 g/dL (ref 3.5–5.2)
Alkaline Phosphatase: 76 U/L (ref 39–117)
BUN: 14 mg/dL (ref 6–23)
CO2: 27 mEq/L (ref 19–32)
Calcium: 9.3 mg/dL (ref 8.4–10.5)
Chloride: 94 mEq/L — ABNORMAL LOW (ref 96–112)
Creatinine, Ser: 1.13 mg/dL (ref 0.50–1.35)
GFR calc Af Amer: 82 mL/min — ABNORMAL LOW (ref >90)
GFR calc non Af Amer: 70 mL/min — ABNORMAL LOW (ref >90)
Glucose, Bld: 128 mg/dL — ABNORMAL HIGH (ref 70–99)
Potassium: 3.5 mEq/L (ref 3.5–5.1)
Sodium: 133 mEq/L — ABNORMAL LOW (ref 135–145)
Total Bilirubin: 1.5 mg/dL — ABNORMAL HIGH (ref 0.3–1.2)
Total Protein: 7.5 g/dL (ref 6.0–8.3)

## 2012-09-11 LAB — POCT URINALYSIS DIPSTICK
Glucose, UA: NEGATIVE
Nitrite, UA: NEGATIVE
Spec Grav, UA: 1.02

## 2012-09-11 LAB — URINALYSIS, MICROSCOPIC ONLY
Bilirubin Urine: NEGATIVE
Glucose, UA: NEGATIVE mg/dL
Ketones, ur: NEGATIVE mg/dL
Leukocytes, UA: NEGATIVE
Nitrite: NEGATIVE
Protein, ur: NEGATIVE mg/dL
Specific Gravity, Urine: >1.046 — ABNORMAL HIGH (ref 1.005–1.030)
Urobilinogen, UA: 1 mg/dL (ref 0.0–1.0)
pH: 7 (ref 5.0–8.0)

## 2012-09-11 LAB — LIPASE, BLOOD: Lipase: 22 U/L (ref 11–59)

## 2012-09-11 MED ORDER — DIPHENHYDRAMINE HCL 50 MG/ML IJ SOLN
12.5000 mg | Freq: Four times a day (QID) | INTRAMUSCULAR | Status: DC | PRN
  Administered 2012-09-15: 25 mg via INTRAVENOUS
  Filled 2012-09-11: qty 1

## 2012-09-11 MED ORDER — PANTOPRAZOLE SODIUM 40 MG IV SOLR
40.0000 mg | Freq: Every day | INTRAVENOUS | Status: DC
  Administered 2012-09-11 – 2012-09-15 (×5): 40 mg via INTRAVENOUS
  Filled 2012-09-11 (×6): qty 40

## 2012-09-11 MED ORDER — CIPROFLOXACIN IN D5W 400 MG/200ML IV SOLN
400.0000 mg | Freq: Once | INTRAVENOUS | Status: AC
  Administered 2012-09-11: 400 mg via INTRAVENOUS
  Filled 2012-09-11: qty 200

## 2012-09-11 MED ORDER — ACETAMINOPHEN 325 MG PO TABS
650.0000 mg | ORAL_TABLET | Freq: Once | ORAL | Status: AC
  Administered 2012-09-11: 650 mg via ORAL
  Filled 2012-09-11: qty 2

## 2012-09-11 MED ORDER — HYDROMORPHONE HCL PF 1 MG/ML IJ SOLN
0.5000 mg | INTRAMUSCULAR | Status: DC | PRN
  Administered 2012-09-11 – 2012-09-14 (×12): 1 mg via INTRAVENOUS
  Filled 2012-09-11 (×12): qty 1

## 2012-09-11 MED ORDER — DIPHENHYDRAMINE HCL 12.5 MG/5ML PO ELIX
12.5000 mg | ORAL_SOLUTION | Freq: Four times a day (QID) | ORAL | Status: DC | PRN
  Administered 2012-09-16: 12.5 mg via ORAL
  Filled 2012-09-11: qty 5

## 2012-09-11 MED ORDER — SODIUM CHLORIDE 0.9 % IV SOLN: INTRAVENOUS | Status: DC

## 2012-09-11 MED ORDER — SODIUM CHLORIDE 0.9 % IV SOLN
1000.0000 mL | Freq: Once | INTRAVENOUS | Status: AC
  Administered 2012-09-11: 1000 mL via INTRAVENOUS

## 2012-09-11 MED ORDER — IOHEXOL 300 MG/ML  SOLN
100.0000 mL | Freq: Once | INTRAMUSCULAR | Status: AC | PRN
  Administered 2012-09-11: 100 mL via INTRAVENOUS

## 2012-09-11 MED ORDER — ONDANSETRON HCL 4 MG/2ML IJ SOLN
4.0000 mg | Freq: Four times a day (QID) | INTRAMUSCULAR | Status: DC | PRN
  Administered 2012-09-12: 4 mg via INTRAVENOUS
  Filled 2012-09-11: qty 2

## 2012-09-11 MED ORDER — HYDROMORPHONE HCL PF 1 MG/ML IJ SOLN
1.0000 mg | Freq: Once | INTRAMUSCULAR | Status: AC
  Administered 2012-09-11: 1 mg via INTRAVENOUS
  Filled 2012-09-11: qty 1

## 2012-09-11 MED ORDER — METRONIDAZOLE IN NACL 5-0.79 MG/ML-% IV SOLN
500.0000 mg | Freq: Once | INTRAVENOUS | Status: AC
  Administered 2012-09-11: 500 mg via INTRAVENOUS
  Filled 2012-09-11: qty 100

## 2012-09-11 MED ORDER — FENTANYL CITRATE 0.05 MG/ML IJ SOLN
50.0000 ug | Freq: Once | INTRAMUSCULAR | Status: AC
  Administered 2012-09-11: 50 ug via INTRAVENOUS
  Filled 2012-09-11: qty 2

## 2012-09-11 MED ORDER — KETOROLAC TROMETHAMINE 15 MG/ML IJ SOLN
15.0000 mg | Freq: Once | INTRAMUSCULAR | Status: AC
  Administered 2012-09-11: 15 mg via INTRAVENOUS
  Filled 2012-09-11: qty 1

## 2012-09-11 MED ORDER — KCL IN DEXTROSE-NACL 20-5-0.9 MEQ/L-%-% IV SOLN
INTRAVENOUS | Status: DC
  Administered 2012-09-11 – 2012-09-15 (×8): via INTRAVENOUS
  Filled 2012-09-11 (×14): qty 1000

## 2012-09-11 NOTE — ED Notes (Signed)
Pt states still unable to give urine sample.

## 2012-09-11 NOTE — ED Notes (Signed)
Patient unable to provide urine sample at this time. Provided urinal for patient. 

## 2012-09-11 NOTE — Patient Instructions (Addendum)
Please go to the ER at [facility], they know you will be arriving shortly

## 2012-09-11 NOTE — ED Notes (Signed)
Pt being sent by Dr. Drue Novel for CT and bloodwork. Work up for appendicitis vs diverticulitis vs colitis.

## 2012-09-11 NOTE — ED Provider Notes (Signed)
History    56 year old male with abdominal pain. Gradual onset yesterday morning and progressively worsening. Constant. Pain is across the lower abdomen, slightly worse on the left side. Associated with tenesmus and multiple episodes of diarrhea. No blood or mucus in the stool. Subjective fever. No urinary complaints. Of abdominal surgery. No sick contacts. Patient was evaluated by his primary care physician prior to her arrival and referred to the ER. He tried taking a dose of advil yesterday which helped minimally.   CSN: [id number]  Arrival date & time 09/11/12  1225   First MD Initiated Contact with Patient 09/11/12 1250      Chief Complaint  Patient presents with  . Abdominal Pain  . Diarrhea    (Consider location/radiation/quality/duration/timing/severity/associated sxs/prior treatment) HPI  Past Medical History  Diagnosis Date  . ADD (attention deficit disorder)     adult- per psych  . Insomnia     per psych  . Heart murmur     Echo 2003 "thickened and somehow myomateous MV, mild MR"  . Fall 4-10    fx left hip and wrist    Past Surgical History  Procedure Date  . Nose biopsy 2011    Dr Terri Piedra, no cancer  . Surgery for left hip and wrist     Family History  Problem Relation Age of Onset  . Cancer Mother     breast ca- survivor  . Cancer Father     lung/ smoker  . Diabetes Neg Hx   . Coronary artery disease Neg Hx   . Hypertension Neg Hx     History  Substance Use Topics  . Smoking status: Never Smoker   . Smokeless tobacco: Never Used  . Alcohol Use: Yes     weekly      Review of Systems   Review of symptoms negative unless otherwise noted in HPI.   Allergies  Review of patient's allergies indicates no known allergies.  Home Medications   Current Outpatient Rx  Name Route Sig Dispense Refill  . AMPHETAMINE-DEXTROAMPHETAMINE 30 MG PO TABS Oral Take 30 mg by mouth daily.      . CENTRUM ULTRA MENS PO TABS Oral Take by mouth. Three times a  week--Monday, Wednesday, Friday     . FISH OIL 1200 MG PO CAPS Oral Take by mouth. Three times a week--Monday, Wednesday, Friday.     . TRAZODONE HCL 100 MG PO TABS Oral Take 50 mg by mouth at bedtime.        BP 139/81  Pulse 121  Temp 100.8 F (38.2 C) (Oral)  Resp 20  SpO2 98%  Physical Exam  Nursing note and vitals reviewed. Constitutional: He appears well-developed and well-nourished.       Mildly uncomfortable appearing, but not toxic.  HENT:  Head: Normocephalic and atraumatic.  Eyes: Conjunctivae normal are normal. Right eye exhibits no discharge. Left eye exhibits no discharge.  Neck: Neck supple.  Cardiovascular: Regular rhythm and normal heart sounds.  Exam reveals no gallop and no friction rub.   No murmur heard.      Tachycardic with reg rhythm  Pulmonary/Chest: Effort normal and breath sounds normal. No respiratory distress.  Abdominal: Soft. He exhibits no distension. There is tenderness. There is guarding.       Normal to inspection. Tenderness across the lower abdomen, slightly worse on the left side. Voluntary guarding. No rebound tenderness. No distention.  Genitourinary:       No costovertebral angle tenderness  Musculoskeletal: He exhibits  no edema and no tenderness.  Neurological: He is alert.  Skin: Skin is warm and dry. He is not diaphoretic.  Psychiatric: He has a normal mood and affect. His behavior is normal. Thought content normal.    ED Course  Procedures (including critical care time)  CRITICAL CARE Performed by: Raeford Razor   Total critical care time: 30 minutes  Critical care time was exclusive of separately billable procedures and treating other patients.  Critical care was necessary to treat or prevent imminent or life-threatening deterioration.  Critical care was time spent personally by me on the following activities: development of treatment plan with patient and/or surrogate as well as nursing, discussions with consultants,  evaluation of patient's response to treatment, examination of patient, obtaining history from patient or surrogate, ordering and performing treatments and interventions, ordering and review of laboratory studies, ordering and review of radiographic studies, pulse oximetry and re-evaluation of patient's condition.  Labs Reviewed  CBC WITH DIFFERENTIAL - Abnormal; Notable for the following:    WBC 19.0 (*)     Neutrophils Relative 89 (*)     Neutro Abs 16.9 (*)     Lymphocytes Relative 7 (*)     All other components within normal limits  COMPREHENSIVE METABOLIC PANEL - Abnormal; Notable for the following:    Sodium 133 (*)     Chloride 94 (*)     Glucose, Bld 128 (*)     Total Bilirubin 1.5 (*)     GFR calc non Af Amer 70 (*)     GFR calc Af Amer 82 (*)     All other components within normal limits  URINALYSIS, MICROSCOPIC ONLY - Abnormal; Notable for the following:    Specific Gravity, Urine >1.046 (*)     Hgb urine dipstick TRACE (*)     Bacteria, UA FEW (*)     All other components within normal limits  BASIC METABOLIC PANEL - Abnormal; Notable for the following:    Sodium 132 (*)     Glucose, Bld 156 (*)     Calcium 8.3 (*)     GFR calc non Af Amer 80 (*)     All other components within normal limits  CBC - Abnormal; Notable for the following:    WBC 14.2 (*)     RBC 3.87 (*)     Hemoglobin 12.2 (*)     HCT 35.5 (*)     All other components within normal limits  CBC WITH DIFFERENTIAL - Abnormal; Notable for the following:    WBC 11.5 (*)     RBC 3.61 (*)     Hemoglobin 11.4 (*)     HCT 33.2 (*)     Neutrophils Relative 86 (*)     Neutro Abs 9.9 (*)     Lymphocytes Relative 6 (*)     All other components within normal limits  BASIC METABOLIC PANEL - Abnormal; Notable for the following:    Sodium 132 (*)     Glucose, Bld 114 (*)     All other components within normal limits  CBC - Abnormal; Notable for the following:    WBC 12.7 (*)     RBC 3.98 (*)     Hemoglobin  12.7 (*)     HCT 36.2 (*)     All other components within normal limits  LIPASE, BLOOD  CBC   No results found.  Ct Abdomen Pelvis W Contrast  09/11/2012  *RADIOLOGY REPORT*  Clinical Data: Pain,  chills, fever, diarrhea, leukocytosis  CT ABDOMEN AND PELVIS WITH CONTRAST  Technique:  Multidetector CT imaging of the abdomen and pelvis was performed following the standard protocol during bolus administration of intravenous contrast. Sagittal and coronal MPR images reconstructed from axial data set.  Contrast: OMNIPAQUE IOHEXOL 300 MG/ML  SOLN Dilute oral contrast.  Comparison: 03/07/2009  Findings: Minimal dependent atelectasis at lung bases. Small splenic cyst 1.3 x 1.4 cm image 12 unchanged. Liver, spleen, pancreas, kidneys, and adrenal glands otherwise normal. Normal appendix.  Diverticulosis of descending/sigmoid colon with wall thickening and pericolic inflammatory changes at mid sigmoid colon consistent with acute diverticulitis. Numerous foci of extraluminal gas are identified within the sigmoid mesocolon compatible with perforation. No discrete drainable abscess collection or free intraperitoneal air. Few small bowel loops in the left abdomen show minimal dilatation question ileus. Remainder of colon unremarkable.  Bladder and ureters normal appearance. Mildly prominent proximal inguinal canals bilaterally question small fat containing hernias. 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 No mass, adenopathy, or free fluid. No acute osseous findings.  IMPRESSION: Perforated sigmoid diverticulitis with multiple foci of extraluminal gas within sigmoid mesocolon. No discrete drainable abscess collection or free intraperitoneal air.  Findings called to Dr. Juleen China on 09/11/2012 at 1420 hours.   Original Report Authenticated By: Lollie Marrow, M.D.    1. Perforated diverticulitis       MDM  56yM with abdominal pain. CT  significant for perforated diverticulitis. Abx ordered. Surgery consulted.        Raeford Razor, MD 09/14/12 2350

## 2012-09-11 NOTE — ED Notes (Signed)
Pt presenting to ed with c/o abdominal pain with diarrhea pt denies nausea and vomiting at this time. Pt also c/o fever and chills. Pt states sent by his pcp.

## 2012-09-11 NOTE — Progress Notes (Signed)
  Subjective:    Patient ID: [name], male    DOB: 23-Jun-1956, 56 y.o.   MRN: [id number]  HPI Acute visit Yesterday morning, had a single episode of diarrhea, stools were watery, nonbloody, and a small amount. After that, he had some rectal tenesmus, few hours later he developed bilateral lower abdominal pain, the pain was steady, not crampy and quite intense at times. He also feels quite bloated at the lower abdomen. Today he had 2 episodes of diarrhea ~ 10 am: Again small amounts, liquid stools, no blood.  Past Medical History  Diagnosis Date  . ADD (attention deficit disorder)     adult- per psych  . Insomnia     per psych  . Heart murmur     Echo 2003 "thickened and somehow myomateous MV, mild MR"  . Fall 4-10    fx left hip and wrist   Past Surgical History  Procedure Date  . Nose biopsy 2011    Dr Terri Piedra, no cancer  . Surgery for left hip and wrist     Review of Systems Denies any sick contacts, he [place] during [place] and ate in restaurants. Has not taken any antibiotics Appetite is decreased No nausea or vomiting No dysuria gross hematuria His temperature yesterday was around 100.    Objective:   Physical Exam  General -- alert, well-developed. No apparent distress. Afebrile, pulse 125, nontoxic-appearing Neck --no LADs HEENT -- not pale or jaundice  Lungs -- normal respiratory effort, no intercostal retractions, no accessory muscle use, and normal breath sounds.   Heart-- normal rate, regular rhythm, no murmur, and no gallop.   Abdomen-- not distended, slightly increased bowel sounds, quite tender at the L>R lower abdomen and suprapubic area without mass or rebound DRE, normal prostate, no masses, brown stools, Hemoccult negative Extremities-- no pretibial edema bilaterally  Psych-- Cognition and judgment appear intact. Alert and cooperative with normal attention span and concentration.  not anxious appearing and not depressed  appearing.      Assessment & Plan:   Intense lower abdominal pain, diarrhea :  Etiology of the symptoms is not completely clear, differential diagnosis includes diverticulitis, appendicitis, colitis; I did check a UA for completeness and it showed blood but symptoms are not consistent with a UTI or kidney stones.Prostate is not particularly tender  Plan: UCX sent Needs further evaluation today, rec to go to the [facility] emergency room for consideration of a CAT scan, labs, etc. I discussed the case with the  charge nurse.

## 2012-09-11 NOTE — H&P (Signed)
[name] is an 56 y.o. male.   Chief Complaint: Abdominal pain HPI: 56 yr old male who presented to [facility] with 24hr history of abdominal pain.  His evaluation showed a perforated sigmoid diverticulitis with leukocytosis.  He reports it began suddenly and progressively got worse.  It was associated with fevers and chills but no nausea or vomiting.  He denies diarrhea, BRBPR or constipation.  He has had no other associated symptoms.  He denies any previous diagnosis of diverticulosis but did have a colonoscopy in the last 6 months.  He reports this was normal.  He denies any abdominal surgeries.  He is otherwise very healthy.  Past Medical History  Diagnosis Date  . ADD (attention deficit disorder)     adult- per psych  . Insomnia     per psych  . Heart murmur     Echo 2003 "thickened and somehow myomateous MV, mild MR"  . Fall 4-10    fx left hip and wrist    Past Surgical History  Procedure Date  . Nose biopsy 2011    Dr Terri Piedra, no cancer  . Surgery for left hip and wrist     Family History  Problem Relation Age of Onset  . Cancer Mother     breast ca- survivor  . Cancer Father     lung/ smoker  . Diabetes Neg Hx   . Coronary artery disease Neg Hx   . Hypertension Neg Hx    Social History:  reports that he has never smoked. He has never used smokeless tobacco. He reports that he drinks alcohol. He reports that he does not use illicit drugs.  Allergies: No Known Allergies   (Not in a hospital admission)  Results for orders placed during the hospital encounter of 09/11/12 (from the past 48 hour(s))  CBC WITH DIFFERENTIAL     Status: Abnormal   Collection Time   09/11/12  1:00 PM      Component Value Range Comment   WBC 19.0 (*) 4.0 - 10.5 K/uL    RBC 4.63  4.22 - 5.81 MIL/uL    Hemoglobin 14.4  13.0 - 17.0 g/dL    HCT 16.1  09.6 - 04.5 %    MCV 91.4  78.0 - 100.0 fL    MCH 31.1  26.0 - 34.0 pg    MCHC 34.0  30.0 - 36.0 g/dL    RDW 40.9  81.1 - 91.4 %    Platelets  229  150 - 400 K/uL    Neutrophils Relative 89 (*) 43 - 77 %    Neutro Abs 16.9 (*) 1.7 - 7.7 K/uL    Lymphocytes Relative 7 (*) 12 - 46 %    Lymphs Abs 1.3  0.7 - 4.0 K/uL    Monocytes Relative 4  3 - 12 %    Monocytes Absolute 0.8  0.1 - 1.0 K/uL    Eosinophils Relative 0  0 - 5 %    Eosinophils Absolute 0.0  0.0 - 0.7 K/uL    Basophils Relative 0  0 - 1 %    Basophils Absolute 0.0  0.0 - 0.1 K/uL   COMPREHENSIVE METABOLIC PANEL     Status: Abnormal   Collection Time   09/11/12  1:00 PM      Component Value Range Comment   Sodium 133 (*) 135 - 145 mEq/L    Potassium 3.5  3.5 - 5.1 mEq/L    Chloride 94 (*) 96 - 112  mEq/L    CO2 27  19 - 32 mEq/L    Glucose, Bld 128 (*) 70 - 99 mg/dL    BUN 14  6 - 23 mg/dL    Creatinine, Ser 0.98  0.50 - 1.35 mg/dL    Calcium 9.3  8.4 - 11.9 mg/dL    Total Protein 7.5  6.0 - 8.3 g/dL    Albumin 3.5  3.5 - 5.2 g/dL    AST 22  0 - 37 U/L    ALT 32  0 - 53 U/L    Alkaline Phosphatase 76  39 - 117 U/L    Total Bilirubin 1.5 (*) 0.3 - 1.2 mg/dL    GFR calc non Af Amer 70 (*) >90 mL/min    GFR calc Af Amer 82 (*) >90 mL/min   LIPASE, BLOOD     Status: Normal   Collection Time   09/11/12  1:00 PM      Component Value Range Comment   Lipase 22  11 - 59 U/L    Ct Abdomen Pelvis W Contrast  09/11/2012  *RADIOLOGY REPORT*  Clinical Data: Pain, chills, fever, diarrhea, leukocytosis  CT ABDOMEN AND PELVIS WITH CONTRAST  Technique:  Multidetector CT imaging of the abdomen and pelvis was performed following the standard protocol during bolus administration of intravenous contrast. Sagittal and coronal MPR images reconstructed from axial data set.  Contrast: OMNIPAQUE IOHEXOL 300 MG/ML  SOLN Dilute oral contrast.  Comparison: 03/07/2009  Findings: Minimal dependent atelectasis at lung bases. Small splenic cyst 1.3 x 1.4 cm image 12 unchanged. Liver, spleen, pancreas, kidneys, and adrenal glands otherwise normal. Normal appendix.  Diverticulosis of  descending/sigmoid colon with wall thickening and pericolic inflammatory changes at mid sigmoid colon consistent with acute diverticulitis. Numerous foci of extraluminal gas are identified within the sigmoid mesocolon compatible with perforation. No discrete drainable abscess collection or free intraperitoneal air. Few small bowel loops in the left abdomen show minimal dilatation question ileus. Remainder of colon unremarkable.  Bladder and ureters normal appearance. Mildly prominent proximal inguinal canals bilaterally question small fat containing hernias. 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 No mass, adenopathy, or free fluid. No acute osseous findings.  IMPRESSION: Perforated sigmoid diverticulitis with multiple foci of extraluminal gas within sigmoid mesocolon. No discrete drainable abscess collection or free intraperitoneal air.  Findings called to Dr. Juleen China on 09/11/2012 at 1420 hours.   Original Report Authenticated By: Lollie Marrow, M.D.     Review of Systems  Constitutional: Positive for fever and chills. Negative for weight loss and malaise/fatigue.  HENT: Negative.        Seasonal allergy congestion   Eyes: Negative.   Respiratory: Negative.   Cardiovascular: Negative.   Gastrointestinal: Positive for abdominal pain. Negative for nausea, vomiting, diarrhea and constipation.  Genitourinary: Negative.   Musculoskeletal: Negative.   Skin: Negative.   Neurological: Negative.   Endo/Heme/Allergies: Negative.   Psychiatric/Behavioral: Negative.     Blood pressure 139/81, pulse 121, temperature 100.8 F (38.2 C), temperature source Oral, resp. rate 20, SpO2 98.00%. Physical Exam  Constitutional: He is oriented to person, place, and time. He appears well-developed and well-nourished. No distress.  HENT:  Head: Normocephalic and atraumatic.  Eyes: Conjunctivae normal are normal. Pupils are equal, round,  and reactive to light.  Neck: Normal range of motion. Neck supple.  Cardiovascular: Normal rate and regular rhythm.   Respiratory: Effort normal and breath sounds normal.  GI: Bowel sounds are normal. He exhibits no distension. There is tenderness. There is no rebound.  Genitourinary:       Deferred   Musculoskeletal: Normal range of motion.  Neurological: He is alert and oriented to person, place, and time.  Skin: Skin is warm and dry.  Psychiatric: He has a normal mood and affect. His behavior is normal.     Assessment/Plan 1. Perforated sigmoid diverticulitis: admit, start on cipro and flagyl IV, IVFs, prn pain meds and antiemetics, repeat labs in am.  Will have Dr. Carolynne Edouard review CT to see if conservative management is possible vs emergent surgical intervention.  Ideally would like to do conservative management and avoid surgery with stool contamination and active infection.  [name] 09/11/2012, 3:04 PM

## 2012-09-12 LAB — CBC
MCV: 91.7 fL (ref 78.0–100.0)
Platelets: 191 10*3/uL (ref 150–400)
RBC: 3.87 MIL/uL — ABNORMAL LOW (ref 4.22–5.81)
RDW: 12.9 % (ref 11.5–15.5)
WBC: 14.2 10*3/uL — ABNORMAL HIGH (ref 4.0–10.5)

## 2012-09-12 LAB — BASIC METABOLIC PANEL
CO2: 24 mEq/L (ref 19–32)
Calcium: 8.3 mg/dL — ABNORMAL LOW (ref 8.4–10.5)
GFR calc Af Amer: >90 mL/min (ref >90)
Sodium: 132 mEq/L — ABNORMAL LOW (ref 135–145)

## 2012-09-12 MED ORDER — CIPROFLOXACIN IN D5W 400 MG/200ML IV SOLN
400.0000 mg | Freq: Two times a day (BID) | INTRAVENOUS | Status: DC
  Administered 2012-09-12 – 2012-09-16 (×8): 400 mg via INTRAVENOUS
  Filled 2012-09-12 (×8): qty 200

## 2012-09-12 MED ORDER — ACETAMINOPHEN 10 MG/ML IV SOLN
1000.0000 mg | Freq: Once | INTRAVENOUS | Status: AC
  Administered 2012-09-12: 1000 mg via INTRAVENOUS
  Filled 2012-09-12: qty 100

## 2012-09-12 MED ORDER — METRONIDAZOLE IN NACL 5-0.79 MG/ML-% IV SOLN
500.0000 mg | Freq: Three times a day (TID) | INTRAVENOUS | Status: DC
  Administered 2012-09-12 – 2012-09-16 (×11): 500 mg via INTRAVENOUS
  Filled 2012-09-12 (×13): qty 100

## 2012-09-12 NOTE — Progress Notes (Signed)
Dr Carolynne Edouard called regarding patient antibiotic status. Orders given regarding antibiotics

## 2012-09-12 NOTE — Progress Notes (Signed)
  Subjective: No complains. He is having diarrhea but no nausea. Pain is a little better.  Objective: Vital signs in last 24 hours: Temp:  [98.3 F (36.8 C)-100.9 F (38.3 C)] 99.9 F (37.7 C) (10/26 0618) Pulse Rate:  [93-125] 111  (10/26 0618) Resp:  [18-20] 18  (10/26 0618) BP: (112-139)/(74-82) 121/80 mmHg (10/26 0618) SpO2:  [93 %-98 %] 94 % (10/26 0618) Weight:  [188 lb (85.276 kg)] 188 lb (85.276 kg) (10/25 1715) Last BM Date: 09/12/12  Intake/Output from previous day: 10/25 0701 - 10/26 0700 In: 1718.8 [I.V.:1718.8] Out: 1250 [Urine:1250] Intake/Output this shift:    GI: soft, tender in LLQ. no peritonitis  Lab Results:   Basename 09/12/12 0320 09/11/12 1300  WBC 14.2* 19.0*  HGB 12.2* 14.4  HCT 35.5* 42.3  PLT 191 229   BMET  Basename 09/12/12 0320 09/11/12 1300  NA 132* 133*  K 3.7 3.5  CL 100 94*  CO2 24 27  GLUCOSE 156* 128*  BUN 13 14  CREATININE 1.02 1.13  CALCIUM 8.3* 9.3   PT/INR No results found for this basename: LABPROT:2,INR:2 in the last 72 hours ABG No results found for this basename: PHART:2,PCO2:2,PO2:2,HCO3:2 in the last 72 hours  Studies/Results: Ct Abdomen Pelvis W Contrast  09/11/2012  *RADIOLOGY REPORT*  Clinical Data: Pain, chills, fever, diarrhea, leukocytosis  CT ABDOMEN AND PELVIS WITH CONTRAST  Technique:  Multidetector CT imaging of the abdomen and pelvis was performed following the standard protocol during bolus administration of intravenous contrast. Sagittal and coronal MPR images reconstructed from axial data set.  Contrast: OMNIPAQUE IOHEXOL 300 MG/ML  SOLN Dilute oral contrast.  Comparison: 03/07/2009  Findings: Minimal dependent atelectasis at lung bases. Small splenic cyst 1.3 x 1.4 cm image 12 unchanged. Liver, spleen, pancreas, kidneys, and adrenal glands otherwise normal. Normal appendix.  Diverticulosis of descending/sigmoid colon with wall thickening and pericolic inflammatory changes at mid sigmoid colon  consistent with acute diverticulitis. Numerous foci of extraluminal gas are identified within the sigmoid mesocolon compatible with perforation. No discrete drainable abscess collection or free intraperitoneal air. Few small bowel loops in the left abdomen show minimal dilatation question ileus. Remainder of colon unremarkable.  Bladder and ureters normal appearance. Mildly prominent proximal inguinal canals bilaterally question small fat containing hernias. 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 No mass, adenopathy, or free fluid. No acute osseous findings.  IMPRESSION: Perforated sigmoid diverticulitis with multiple foci of extraluminal gas within sigmoid mesocolon. No discrete drainable abscess collection or free intraperitoneal air.  Findings called to Dr. Juleen China on 09/11/2012 at 1420 hours.   Original Report Authenticated By: Lollie Marrow, M.D.     Anti-infectives: Anti-infectives     Start     Dose/Rate Route Frequency Ordered Stop   09/11/12 1430   ciprofloxacin (CIPRO) IVPB 400 mg        400 mg 200 mL/hr over 60 Minutes Intravenous  Once 09/11/12 1420 09/11/12 1647   09/11/12 1430   metroNIDAZOLE (FLAGYL) IVPB 500 mg        500 mg 100 mL/hr over 60 Minutes Intravenous  Once 09/11/12 1420 09/11/12 1749          Assessment/Plan: s/p * No surgery found * Continue abx and bowel rest  LOS: 1 day    TOTH III,PAUL S 09/12/2012

## 2012-09-13 LAB — BASIC METABOLIC PANEL
BUN: 11 mg/dL (ref 6–23)
CO2: 23 mEq/L (ref 19–32)
Chloride: 101 mEq/L (ref 96–112)
Creatinine, Ser: 0.81 mg/dL (ref 0.50–1.35)
GFR calc Af Amer: >90 mL/min (ref >90)
Potassium: 3.8 mEq/L (ref 3.5–5.1)

## 2012-09-13 LAB — CBC WITH DIFFERENTIAL/PLATELET
HCT: 33.2 % — ABNORMAL LOW (ref 39.0–52.0)
Hemoglobin: 11.4 g/dL — ABNORMAL LOW (ref 13.0–17.0)
Lymphocytes Relative: 6 % — ABNORMAL LOW (ref 12–46)
Lymphs Abs: 0.7 10*3/uL (ref 0.7–4.0)
Monocytes Absolute: 0.9 10*3/uL (ref 0.1–1.0)
Monocytes Relative: 8 % (ref 3–12)
Neutro Abs: 9.9 10*3/uL — ABNORMAL HIGH (ref 1.7–7.7)
Neutrophils Relative %: 86 % — ABNORMAL HIGH (ref 43–77)
RBC: 3.61 MIL/uL — ABNORMAL LOW (ref 4.22–5.81)
WBC: 11.5 10*3/uL — ABNORMAL HIGH (ref 4.0–10.5)

## 2012-09-13 MED ORDER — ACETAMINOPHEN 10 MG/ML IV SOLN
1000.0000 mg | Freq: Once | INTRAVENOUS | Status: AC
  Administered 2012-09-13: 1000 mg via INTRAVENOUS
  Filled 2012-09-13: qty 100

## 2012-09-13 NOTE — Progress Notes (Signed)
Utilization review completed.  

## 2012-09-13 NOTE — Progress Notes (Signed)
Subjective: May feel slightly better today. Fever broke last night after nurse found out that abx had been cancelled. abx restarted last night and fever better today. Still has LLQ pain but abd is otherwise soft. Positive diarrhea  Objective: Vital signs in last 24 hours: Temp:  [98.2 F (36.8 C)-102.1 F (38.9 C)] 98.5 F (36.9 C) (10/27 0617) Pulse Rate:  [91-115] 91  (10/27 0617) Resp:  [18-20] 20  (10/27 0617) BP: (116-130)/(69-82) 116/71 mmHg (10/27 0617) SpO2:  [92 %-97 %] 96 % (10/27 0617) Last BM Date: 09/12/12  Intake/Output from previous day: 10/26 0701 - 10/27 0700 In: 3536.7 [P.O.:120; I.V.:3016.7; IV Piggyback:400] Out: -  Intake/Output this shift:    GI: soft, tender in LLQ. no peritonitis  Lab Results:   Basename 09/13/12 0752 09/12/12 0320  WBC 11.5* 14.2*  HGB 11.4* 12.2*  HCT 33.2* 35.5*  PLT 185 191   BMET  Basename 09/12/12 0320 09/11/12 1300  NA 132* 133*  K 3.7 3.5  CL 100 94*  CO2 24 27  GLUCOSE 156* 128*  BUN 13 14  CREATININE 1.02 1.13  CALCIUM 8.3* 9.3   PT/INR No results found for this basename: LABPROT:2,INR:2 in the last 72 hours ABG No results found for this basename: PHART:2,PCO2:2,PO2:2,HCO3:2 in the last 72 hours  Studies/Results: Ct Abdomen Pelvis W Contrast  09/11/2012  *RADIOLOGY REPORT*  Clinical Data: Pain, chills, fever, diarrhea, leukocytosis  CT ABDOMEN AND PELVIS WITH CONTRAST  Technique:  Multidetector CT imaging of the abdomen and pelvis was performed following the standard protocol during bolus administration of intravenous contrast. Sagittal and coronal MPR images reconstructed from axial data set.  Contrast: OMNIPAQUE IOHEXOL 300 MG/ML  SOLN Dilute oral contrast.  Comparison: 03/07/2009  Findings: Minimal dependent atelectasis at lung bases. Small splenic cyst 1.3 x 1.4 cm image 12 unchanged. Liver, spleen, pancreas, kidneys, and adrenal glands otherwise normal. Normal appendix.  Diverticulosis of  descending/sigmoid colon with wall thickening and pericolic inflammatory changes at mid sigmoid colon consistent with acute diverticulitis. Numerous foci of extraluminal gas are identified within the sigmoid mesocolon compatible with perforation. No discrete drainable abscess collection or free intraperitoneal air. Few small bowel loops in the left abdomen show minimal dilatation question ileus. Remainder of colon unremarkable.  Bladder and ureters normal appearance. Mildly prominent proximal inguinal canals bilaterally question small fat containing hernias. 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 No mass, adenopathy, or free fluid. No acute osseous findings.  IMPRESSION: Perforated sigmoid diverticulitis with multiple foci of extraluminal gas within sigmoid mesocolon. No discrete drainable abscess collection or free intraperitoneal air.  Findings called to Dr. Juleen China on 09/11/2012 at 1420 hours.   Original Report Authenticated By: Lollie Marrow, M.D.     Anti-infectives: Anti-infectives     Start     Dose/Rate Route Frequency Ordered Stop   09/12/12 2200   metroNIDAZOLE (FLAGYL) IVPB 500 mg        500 mg 100 mL/hr over 60 Minutes Intravenous 3 times per day 09/12/12 1953     09/12/12 2100   ciprofloxacin (CIPRO) IVPB 400 mg        400 mg 200 mL/hr over 60 Minutes Intravenous Every 12 hours 09/12/12 1953     09/11/12 1430   ciprofloxacin (CIPRO) IVPB 400 mg        400 mg 200 mL/hr over 60 Minutes Intravenous  Once 09/11/12 1420 09/11/12 1647   09/11/12 1430   metroNIDAZOLE (  FLAGYL) IVPB 500 mg        500 mg 100 mL/hr over 60 Minutes Intravenous  Once 09/11/12 1420 09/11/12 1749          Assessment/Plan: s/p * No surgery found * Continue Cipro and Flagyl Continue bowel rest  LOS: 2 days    TOTH III,PAUL S 09/13/2012

## 2012-09-14 LAB — CBC
HCT: 36.2 % — ABNORMAL LOW (ref 39.0–52.0)
Hemoglobin: 12.7 g/dL — ABNORMAL LOW (ref 13.0–17.0)
MCH: 31.9 pg (ref 26.0–34.0)
MCHC: 35.1 g/dL (ref 30.0–36.0)
MCV: 91 fL (ref 78.0–100.0)
Platelets: 278 10*3/uL (ref 150–400)
RBC: 3.98 MIL/uL — ABNORMAL LOW (ref 4.22–5.81)
RDW: 13 % (ref 11.5–15.5)
WBC: 12.7 10*3/uL — ABNORMAL HIGH (ref 4.0–10.5)

## 2012-09-14 MED ORDER — IBUPROFEN 600 MG PO TABS
600.0000 mg | ORAL_TABLET | Freq: Four times a day (QID) | ORAL | Status: DC | PRN
  Administered 2012-09-14 – 2012-09-17 (×3): 600 mg via ORAL
  Filled 2012-09-14 (×5): qty 1

## 2012-09-14 MED ORDER — ACETAMINOPHEN 325 MG PO TABS
650.0000 mg | ORAL_TABLET | Freq: Four times a day (QID) | ORAL | Status: DC | PRN
  Administered 2012-09-14 – 2012-09-17 (×7): 650 mg via ORAL
  Filled 2012-09-14 (×7): qty 2

## 2012-09-14 NOTE — Progress Notes (Signed)
Patient ID: EDKER BERGSTEN, male   DOB: Aug 01, 1956, 56 y.o.   MRN: [id number]    Subjective: Pt reports still have abd pain but better, denies n/v, +BMs and flatus  Objective: Vital signs in last 24 hours: Temp:  [99.3 F (37.4 C)-101 F (38.3 C)] 101 F (38.3 C) (10/28 0500) Pulse Rate:  [96-107] 107  (10/28 0500) Resp:  [18] 18  (10/28 0500) BP: (110-138)/(86-90) 110/88 mmHg (10/28 0500) SpO2:  [95 %-96 %] 95 % (10/28 0500) Last BM Date: 09/13/12  Intake/Output from previous day: 10/27 0701 - 10/28 0700 In: 2477.1 [I.V.:1777.1; IV Piggyback:700] Out: -  Intake/Output this shift:    GI: soft, tender in LLQ. no peritonitis, +bs  Lab Results:   Basename 09/13/12 0752 09/12/12 0320  WBC 11.5* 14.2*  HGB 11.4* 12.2*  HCT 33.2* 35.5*  PLT 185 191   BMET  Basename 09/13/12 0752 09/12/12 0320  NA 132* 132*  K 3.8 3.7  CL 101 100  CO2 23 24  GLUCOSE 114* 156*  BUN 11 13  CREATININE 0.81 1.02  CALCIUM 8.4 8.3*   PT/INR No results found for this basename: LABPROT:2,INR:2 in the last 72 hours ABG No results found for this basename: PHART:2,PCO2:2,PO2:2,HCO3:2 in the last 72 hours  Studies/Results: No results found.  Anti-infectives: Anti-infectives     Start     Dose/Rate Route Frequency Ordered Stop   09/12/12 2200   metroNIDAZOLE (FLAGYL) IVPB 500 mg        500 mg 100 mL/hr over 60 Minutes Intravenous 3 times per day 09/12/12 1953     09/12/12 2100   ciprofloxacin (CIPRO) IVPB 400 mg        400 mg 200 mL/hr over 60 Minutes Intravenous Every 12 hours 09/12/12 1953     09/11/12 1430   ciprofloxacin (CIPRO) IVPB 400 mg        400 mg 200 mL/hr over 60 Minutes Intravenous  Once 09/11/12 1420 09/11/12 1647   09/11/12 1430   metroNIDAZOLE (FLAGYL) IVPB 500 mg        500 mg 100 mL/hr over 60 Minutes Intravenous  Once 09/11/12 1420 09/11/12 1749          Assessment/Plan: 1.  Perf diverticulitis: improving slowly, abd pain less, WBC trending down  --repeat CBC  today  --start some clears today  --cont cipro/flagyl  --repeat CBC tomorrow    LOS: 3 days    WHITE, ELIZABETH 09/14/2012 Pt seen by myself last night.  Pain improved but still with mild distension.  Treating nonoperatively for now.

## 2012-09-15 LAB — CBC
HCT: 33.5 % — ABNORMAL LOW (ref 39.0–52.0)
Hemoglobin: 11.8 g/dL — ABNORMAL LOW (ref 13.0–17.0)
MCH: 31.5 pg (ref 26.0–34.0)
MCHC: 35.2 g/dL (ref 30.0–36.0)
MCV: 89.3 fL (ref 78.0–100.0)
Platelets: 275 10*3/uL (ref 150–400)
RBC: 3.75 MIL/uL — ABNORMAL LOW (ref 4.22–5.81)
RDW: 13.1 % (ref 11.5–15.5)
WBC: 10.8 10*3/uL — ABNORMAL HIGH (ref 4.0–10.5)

## 2012-09-15 NOTE — Progress Notes (Signed)
  Subjective: Feels good this morning, "would like to eat" No c/o of abdominal pain this am. + BM and flatus.  Objective: Vital signs in last 24 hours: Temp:  [98.4 F (36.9 C)-99.6 F (37.6 C)] 99.6 F (37.6 C) (10/29 0503) Pulse Rate:  [78-93] 93  (10/29 0503) Resp:  [16-18] 18  (10/29 0503) BP: (132-137)/(76-86) 136/78 mmHg (10/29 0503) SpO2:  [97 %-99 %] 97 % (10/29 0503) Last BM Date: 09/14/12  Intake/Output from previous day: 10/28 0701 - 10/29 0700 In: 3968.7 [I.V.:3668.7; IV Piggyback:300] Out: -  Intake/Output this shift:    General appearance: alert, cooperative, appears stated age and no distress Chest: CTA bilaterally Cardiac: RRR No M/R/G Abdomen: some distention, + BS Flatus, non tender. +BM w/o pain + flatus. VSS, tmax 99.6 this am.  Labs: WBC wnl, H&H, Plts stable.  Lab Results:   Basename 09/15/12 0418 09/14/12 1005  WBC 10.8* 12.7*  HGB 11.8* 12.7*  HCT 33.5* 36.2*  PLT 275 278   BMET  Basename 09/13/12 0752  NA 132*  K 3.8  CL 101  CO2 23  GLUCOSE 114*  BUN 11  CREATININE 0.81  CALCIUM 8.4   PT/INR No results found for this basename: LABPROT:2,INR:2 in the last 72 hours ABG No results found for this basename: PHART:2,PCO2:2,PO2:2,HCO3:2 in the last 72 hours  Studies/Results: No results found.  Anti-infectives: Anti-infectives     Start     Dose/Rate Route Frequency Ordered Stop   09/12/12 2200   metroNIDAZOLE (FLAGYL) IVPB 500 mg        500 mg 100 mL/hr over 60 Minutes Intravenous 3 times per day 09/12/12 1953     09/12/12 2100   ciprofloxacin (CIPRO) IVPB 400 mg        400 mg 200 mL/hr over 60 Minutes Intravenous Every 12 hours 09/12/12 1953     09/11/12 1430   ciprofloxacin (CIPRO) IVPB 400 mg        400 mg 200 mL/hr over 60 Minutes Intravenous  Once 09/11/12 1420 09/11/12 1647   09/11/12 1430   metroNIDAZOLE (FLAGYL) IVPB 500 mg        500 mg 100 mL/hr over 60 Minutes Intravenous  Once 09/11/12 1420 09/11/12 1749          Assessment/Plan:  Patient Active Problem List  Diagnosis  . MITRAL INSUFFICIENCY  . Fatigue  Diverticulitis with perforation of sigmoid colon. s/p * No surgery found *  Plan: 1. Continue with conservative management i.e. IVF,ABX. 2. Will start clears today 3. Probably transition to po meds in next 1-2 days. 4. Encourage ambulation and use of IS.   LOS: 4 days    [name] 09/15/2012

## 2012-09-15 NOTE — Progress Notes (Signed)
No pain.  Mild distension but doing okay with liquids.  Will try to advance and keep on abx. Will likely need elective sigmoidectomy if he is able to get through this acute episode without surgery

## 2012-09-16 LAB — URINE CULTURE: Colony Count: 2000

## 2012-09-16 MED ORDER — SIMETHICONE 80 MG PO CHEW
80.0000 mg | CHEWABLE_TABLET | Freq: Four times a day (QID) | ORAL | Status: DC | PRN
  Administered 2012-09-16: 80 mg via ORAL
  Filled 2012-09-16: qty 1

## 2012-09-16 MED ORDER — ZOLPIDEM TARTRATE 5 MG PO TABS
5.0000 mg | ORAL_TABLET | Freq: Every evening | ORAL | Status: DC | PRN
  Administered 2012-09-16: 5 mg via ORAL
  Filled 2012-09-16: qty 1

## 2012-09-16 MED ORDER — METRONIDAZOLE 500 MG PO TABS
500.0000 mg | ORAL_TABLET | Freq: Three times a day (TID) | ORAL | Status: DC
  Administered 2012-09-16 – 2012-09-17 (×4): 500 mg via ORAL
  Filled 2012-09-16 (×7): qty 1

## 2012-09-16 MED ORDER — CIPROFLOXACIN HCL 500 MG PO TABS
500.0000 mg | ORAL_TABLET | Freq: Two times a day (BID) | ORAL | Status: DC
  Administered 2012-09-16 – 2012-09-17 (×2): 500 mg via ORAL
  Filled 2012-09-16 (×5): qty 1

## 2012-09-16 MED ORDER — PANTOPRAZOLE SODIUM 40 MG PO TBEC
40.0000 mg | DELAYED_RELEASE_TABLET | Freq: Every day | ORAL | Status: DC
  Administered 2012-09-16: 40 mg via ORAL
  Filled 2012-09-16 (×2): qty 1

## 2012-09-16 NOTE — Progress Notes (Signed)
He has been clinically improving and remains afebrile.  Tolerating liquids without any increased pain.  I am concerned that he is still mildly distended but otherwise seems to be improving.

## 2012-09-16 NOTE — Care Management Note (Signed)
    Page 1 of 1   09/16/2012     1:43:29 PM   CARE MANAGEMENT NOTE 09/16/2012  Patient:  [name], [name]   Account Number:  [id number]  Date Initiated:  09/16/2012  Documentation initiated by:  Lorenda Ishihara  Subjective/Objective Assessment:   56 yo male admitted with abd pain, likely related to diverticulitis. PTA lived at home with spouse.     Action/Plan:   home when stable   Anticipated DC Date:  09/16/2012   Anticipated DC Plan:  HOME/SELF CARE      DC Planning Services  CM consult      Choice offered to / List presented to:             Status of service:  Completed, signed off Medicare Important Message given?   (If response is "NO", the following Medicare IM given date fields will be blank) Date Medicare IM given:   Date Additional Medicare IM given:    Discharge Disposition:  HOME/SELF CARE  Per UR Regulation:  Reviewed for med. necessity/level of care/duration of stay  If discussed at Long Length of Stay Meetings, dates discussed:    Comments:

## 2012-09-16 NOTE — Progress Notes (Signed)
The patient is receiving Protonix by the intravenous route. Based on criteria approved by the Pharmacy and Therapeutics Committee and the Medical Executive Committee, the medication is being converted to the equivalent oral dose form.   These criteria include:  -No Active GI bleeding  -Able to tolerate diet of full liquids (or better) or tube feeding  -Able to tolerate other medications by the oral or enteral route   If you have any questions about this conversion, please contact the Pharmacy Department ([phone] Thank you.   Loralee Pacas, PharmD, BCPS 09/16/2012 11:36 AM

## 2012-09-16 NOTE — Progress Notes (Signed)
Patient ID: [name], male   DOB: August 19, 1956, 56 y.o.   MRN: [id number]    Subjective: Feels good this morning,+ Flatus no N/V "[place]". Tolerated clears yesterday.  Objective: Vital signs in last 24 hours: Temp:  [97.7 F (36.5 C)-99.5 F (37.5 C)] 97.7 F (36.5 C) (10/30 0559) Pulse Rate:  [67-87] 67  (10/30 0559) Resp:  [18] 18  (10/30 0559) BP: (123-163)/(78-92) 149/80 mmHg (10/30 0559) SpO2:  [93 %-99 %] 97 % (10/30 0559) Last BM Date: 09/14/12  Intake/Output from previous day: 10/29 0701 - 10/30 0700 In: 2777.1 [P.O.:600; I.V.:1577.1; IV Piggyback:600] Out: 5 [Urine:5] Intake/Output this shift:    General appearance: alert, cooperative, appears stated age and no distress Chest: CTA bilaterally Cardiac: RRR No M/R/G Abdomen: some distention, + BS Flatus, non tender. +BM w/o pain + flatus. VSS, afebrile.    Lab Results:   Basename 09/15/12 0418 09/14/12 1005  WBC 10.8* 12.7*  HGB 11.8* 12.7*  HCT 33.5* 36.2*  PLT 275 278   BMET No results found for this basename: NA:2,K:2,CL:2,CO2:2,GLUCOSE:2,BUN:2,CREATININE:2,CALCIUM:2 in the last 72 hours PT/INR No results found for this basename: LABPROT:2,INR:2 in the last 72 hours ABG No results found for this basename: PHART:2,PCO2:2,PO2:2,HCO3:2 in the last 72 hours  Studies/Results: No results found.  Anti-infectives: Anti-infectives     Start     Dose/Rate Route Frequency Ordered Stop   09/16/12 0900   ciprofloxacin (CIPRO) tablet 500 mg        500 mg Oral 2 times daily 09/16/12 0846     09/16/12 0900   metroNIDAZOLE (FLAGYL) tablet 500 mg        500 mg Oral 3 times per day 09/16/12 0848     09/12/12 2200   metroNIDAZOLE (FLAGYL) IVPB 500 mg  Status:  Discontinued        500 mg 100 mL/hr over 60 Minutes Intravenous 3 times per day 09/12/12 1953 09/16/12 0846   09/12/12 2100   ciprofloxacin (CIPRO) IVPB 400 mg  Status:  Discontinued        400 mg 200 mL/hr over 60 Minutes Intravenous Every 12 hours  09/12/12 1953 09/16/12 0846   09/11/12 1430   ciprofloxacin (CIPRO) IVPB 400 mg        400 mg 200 mL/hr over 60 Minutes Intravenous  Once 09/11/12 1420 09/11/12 1647   09/11/12 1430   metroNIDAZOLE (FLAGYL) IVPB 500 mg        500 mg 100 mL/hr over 60 Minutes Intravenous  Once 09/11/12 1420 09/11/12 1749          Assessment/Plan:  Patient Active Problem List  Diagnosis  . MITRAL INSUFFICIENCY  . Fatigue  Diverticulitis with perforation of sigmoid colon. s/p * No surgery found *  Plan: 1. Continue with conservative management  2. Will advance diet to low residue today, transition to po meds. 3. Probable discharge in am if tolerates po meds and diet advancement.   LOS: 5 days    [facility] Pager # [phone]  09/16/2012

## 2012-09-17 MED ORDER — CIPROFLOXACIN HCL 500 MG PO TABS: 500.0000 mg | ORAL_TABLET | Freq: Two times a day (BID) | ORAL | Status: DC

## 2012-09-17 MED ORDER — METRONIDAZOLE 500 MG PO TABS: 500.0000 mg | ORAL_TABLET | Freq: Three times a day (TID) | ORAL | Status: DC

## 2012-09-17 NOTE — Progress Notes (Signed)
Patient states understanding of discharge instructions, states he spoke to Dr. Biagio Quint about BMs this am. I spoke to Dr. Biagio Quint about patient rash to back, scattered red bumps patient states has had for several days, Dr. Jarrett Ables PA states he will look at it before patient leaves. Patient refuses flu vaccine. RX for cipro and flagyl given.

## 2012-09-17 NOTE — Progress Notes (Signed)
Patient discharged ambulatory, Dr. Biagio Quint states ok to go.

## 2012-09-17 NOTE — Progress Notes (Signed)
  Subjective: Remains pain free and AF on oral abx and taking regular diet. Passing lots of flatus but no BM  Objective: Vital signs in last 24 hours: Temp:  [97.5 F (36.4 C)-99.6 F (37.6 C)] 97.5 F (36.4 C) (10/31 0556) Pulse Rate:  [83-112] 112  (10/31 0556) Resp:  [18-20] 20  (10/31 0556) BP: (117-157)/(76-93) 117/76 mmHg (10/31 0556) SpO2:  [94 %-98 %] 94 % (10/31 0556) Last BM Date: 09/14/12  Intake/Output from previous day: 10/30 0701 - 10/31 0700 In: 240 [P.O.:240] Out: 1850 [Urine:1850] Intake/Output this shift:    General appearance: alert, cooperative and no distress Resp: nonlabored Cardio: normal rate, regular GI: soft, NT, mild distension, seems less distended than yesterday, no peritoneal signs  Lab Results:   Basename 09/15/12 0418 09/14/12 1005  WBC 10.8* 12.7*  HGB 11.8* 12.7*  HCT 33.5* 36.2*  PLT 275 278   BMET No results found for this basename: NA:2,K:2,CL:2,CO2:2,GLUCOSE:2,BUN:2,CREATININE:2,CALCIUM:2 in the last 72 hours PT/INR No results found for this basename: LABPROT:2,INR:2 in the last 72 hours ABG No results found for this basename: PHART:2,PCO2:2,PO2:2,HCO3:2 in the last 72 hours  Studies/Results: No results found.  Anti-infectives: Anti-infectives     Start     Dose/Rate Route Frequency Ordered Stop   09/16/12 1000   ciprofloxacin (CIPRO) tablet 500 mg        500 mg Oral 2 times daily 09/16/12 0846     09/16/12 0930   metroNIDAZOLE (FLAGYL) tablet 500 mg        500 mg Oral 3 times per day 09/16/12 0848     09/12/12 2200   metroNIDAZOLE (FLAGYL) IVPB 500 mg  Status:  Discontinued        500 mg 100 mL/hr over 60 Minutes Intravenous 3 times per day 09/12/12 1953 09/16/12 0846   09/12/12 2100   ciprofloxacin (CIPRO) IVPB 400 mg  Status:  Discontinued        400 mg 200 mL/hr over 60 Minutes Intravenous Every 12 hours 09/12/12 1953 09/16/12 0846   09/11/12 1430   ciprofloxacin (CIPRO) IVPB 400 mg        400 mg 200 mL/hr  over 60 Minutes Intravenous  Once 09/11/12 1420 09/11/12 1647   09/11/12 1430   metroNIDAZOLE (FLAGYL) IVPB 500 mg        500 mg 100 mL/hr over 60 Minutes Intravenous  Once 09/11/12 1420 09/11/12 1749          Assessment/Plan: s/p * No surgery found * He is tolerating regular diet and remains AF on oral abx.  He is really anxious for discharge.  I think that it would be reasonable to send him home on abx.  I discussed with him the signs and symptoms to watch out for and he agreed to return if any increased pain, distension, fevers, or nausea.  Continue abx and will follow up as outpatient to consider elective sigmoid colectomy.  LOS: 6 days    Lodema Pilot DAVID 09/17/2012

## 2012-09-21 ENCOUNTER — Telehealth (INDEPENDENT_AMBULATORY_CARE_PROVIDER_SITE_OTHER): Payer: Self-pay | Admitting: General Surgery

## 2012-09-21 NOTE — Telephone Encounter (Signed)
PT called to report that he has been experiencing diarrhea 4-5 times a day since discharge. He usually has 3 in am and less later in the day. He is feeling better each day. No blood noted with bowel movements. No Fever and pain is under control. HE is also asking about diet restrictions./I reviewed this with dr. Biagio Quint in OR and he said that the diarrhea should gradually get better and would not recommend any medication at this time. Also pt should be on a low-residue diet and I explained the diet to [name] with fiber restrictions. He has a f/u appt with Dr. Biagio Quint and will contact our office if he has fever or increased pain.

## 2012-09-21 NOTE — Telephone Encounter (Signed)
Feeling better.  Called with diarrhea.  He is not having any pain or fevers and says that he is less bloated.  I explained that this could be c. Dif and if not improving daily he should let us know so in the next day or two and we can check a stool sample.

## 2012-09-22 NOTE — Discharge Summary (Signed)
Physician Discharge Summary  Patient ID: [name] MRN: [id number] DOB/AGE: 1956-03-22 56 y.o.  Admit date: 09/11/2012 Discharge date: 09/22/2012  Admission Diagnoses: abdominal pain  Discharge Diagnoses: Perforated sigmoid diverticulitis (resolved)  Active Problems:  * No active hospital problems. *    Discharged Condition: stable  Hospital Course:56 yr old male who presented to [facility] with 24hr history of abdominal pain. His evaluation showed a perforated sigmoid diverticulitis with leukocytosis. He reports it began suddenly and progressively got worse. It was associated with fevers and chills but no nausea or vomiting. He denies diarrhea, BRBPR or constipation. He has had no other associated symptoms. He denies any previous diagnosis of diverticulosis but did have a colonoscopy in the last 6 months. He reports this was normal. He denies any abdominal surgeries. He is otherwise very healthy. CT of abdomen and pelvis done 09/11/12: IMPRESSION:  Perforated sigmoid diverticulitis with multiple foci of  extraluminal gas within sigmoid mesocolon.  No discrete drainable abscess collection or free intraperitoneal  air.  Findings called to Dr. Juleen China on 09/11/2012 at 1420 hours.  Original Report Authenticated By: Lollie Marrow, M.D. Patient was seen by our service; treated conservatively with NPO, IVF, and IV abx. He continued to improve over the course of his stay; eventually returning to po intake w/o abdominal pain, he was deemed stable for discharge on 09/17/12. He was discharged on po Cipro and Flagyl with instructions to follow up with his PCP should he have another flare. He was also given discharge instructions and our phone numbers for future use if needed. He verbalized understanding of his instructions and was discharged to home self care in stable condition.  Consults: None  Significant Diagnostic Studies: labs.and radiology.  Treatments: IV hydration, antibiotics,, analgesia,and  respiratory therapy. Discharge Exam: Blood pressure 117/76, pulse 112, temperature 97.5 F (36.4 C), temperature source Axillary, resp. rate 20, height 6\' 1"  (1.854 m), weight 188 lb (85.276 kg), SpO2 94.00%. General appearance: alert, cooperative and no distress  Disposition: 01-Home or Self Care  Discharge Orders    Future Appointments: Provider: Department: Dept Phone: Center:   10/06/2012 4:00 PM Lodema Pilot, DO [facility], [place] [phone] None     Future Orders Please Complete By Expires   Diet - low sodium heart healthy      Increase activity slowly      Discharge instructions      Comments:   Call [phone] to schedule follow up appointment with Dr. Biagio Quint Activity as tolerated. Return to ER if any increased pain, fevers, chills, nausea or vomiting, or increased abdominal distension.   Call MD for:  temperature >100.4      Call MD for:  persistant nausea and vomiting      Call MD for:  severe uncontrolled pain          Medication List     As of 09/22/2012  2:27 PM    TAKE these medications         amphetamine-dextroamphetamine 30 MG tablet   Commonly known as: ADDERALL   Take 30 mg by mouth daily.      CENTRUM ULTRA MENS Tabs   Take by mouth. Three times a week--Monday, Wednesday, Friday      ciprofloxacin 500 MG tablet   Commonly known as: CIPRO   Take 1 tablet (500 mg total) by mouth 2 (two) times daily.      Fish Oil 1200 MG Caps   Take by mouth. Three times a week--Monday, Wednesday, Friday.  metroNIDAZOLE 500 MG tablet   Commonly known as: FLAGYL   Take 1 tablet (500 mg total) by mouth every 8 (eight) hours.      traZODone 100 MG tablet   Commonly known as: DESYREL   Take 50 mg by mouth at bedtime.         SignedBlenda Mounts ACNP [facility] Pager # [phone]  09/22/2012, 2:27 PM

## 2012-09-28 ENCOUNTER — Other Ambulatory Visit (INDEPENDENT_AMBULATORY_CARE_PROVIDER_SITE_OTHER): Payer: Self-pay | Admitting: General Surgery

## 2012-09-28 ENCOUNTER — Telehealth (INDEPENDENT_AMBULATORY_CARE_PROVIDER_SITE_OTHER): Payer: Self-pay

## 2012-09-28 DIAGNOSIS — K529 Noninfective gastroenteritis and colitis, unspecified: Secondary | ICD-10-CM

## 2012-09-28 NOTE — Telephone Encounter (Signed)
Cipro and Flagyl were reordered x 10 days per Dr. Carolynne Edouard.  Called to [facility] and pt is aware.

## 2012-09-28 NOTE — Telephone Encounter (Signed)
Pt just finished his antibiotics yesterday.  C-difficile is pending.  Does he need to start a new antibiotic now or wait for test results?

## 2012-09-28 NOTE — Telephone Encounter (Signed)
Need to ask Carolynne Edouard or [place].  Would not start any new antibiotics.

## 2012-09-28 NOTE — Telephone Encounter (Signed)
Pt was recently treated at the ED for diverticulitis.  He is on an antibiotic and reports he is feeling better.  He is concerned about his chronic diarrhea and would like to know if this will improve or not.  He is scheduled to see Dr. Biagio Quint to discuss surgery.  Pls advise.

## 2012-10-06 ENCOUNTER — Ambulatory Visit (INDEPENDENT_AMBULATORY_CARE_PROVIDER_SITE_OTHER): Payer: BC Managed Care – PPO | Admitting: General Surgery

## 2012-10-06 ENCOUNTER — Encounter (INDEPENDENT_AMBULATORY_CARE_PROVIDER_SITE_OTHER): Payer: Self-pay

## 2012-10-06 ENCOUNTER — Encounter (INDEPENDENT_AMBULATORY_CARE_PROVIDER_SITE_OTHER): Payer: Self-pay | Admitting: General Surgery

## 2012-10-06 VITALS — BP 122/90 | HR 108 | Temp 96.9°F | Resp 20 | Ht 72.75 in | Wt 179.8 lb

## 2012-10-06 DIAGNOSIS — K5732 Diverticulitis of large intestine without perforation or abscess without bleeding: Secondary | ICD-10-CM

## 2012-10-06 NOTE — Progress Notes (Signed)
Subjective:     Patient ID: [name], male   DOB: 20-Apr-1956, 56 y.o.   MRN: [id number]  HPI This patient follows up status post recent hospitalization for acute diverticulitis. He had a microperforation with some localized free air this was treated nonoperatively with antibiotics. He did well and was discharged home from the hospital on Cipro and Flagyl. He had some diarrhea shortly after discharge and had a stool sample which was negative for C. Difficile colitis. He had his antibiotics refilled and has just a few more days remaining. He says that he continues to improve slowly but still feels "uncomfortable" he doesn't have any particular pain but does have some generalized discomfort and malaise. He is tolerating regular diet and hit his bowels have improved. He has not had any fevers or chills and has abdominal bloating is improved. He also had a recent upper respiratory infection.  Review of Systems     Objective:   Physical Exam His abdomen is soft and has no significant tenderness on exam he says he has some mild discomfort with deep palpation mainly in the suprapubic region. He is nondistended and no peritoneal signs and no masses.    Assessment:      sigmoid diverticulitis He seems to be improving from his acute episode of diverticulitis. He is asking to return to work next week. He continues on Cipro and Flagyl but does has another day or 2 remaining. We discussed diverticulitis and the recommendations for elective sigmoid colectomy. He looks a lot better than during the hospital admission think that he is improving. I think that he will continue to slowly improve and I will see him back in 4 weeks or sooner if needed. If he does not continue to improve then we will repeat a CT scan.    Plan:     F/u 4 weeks or sooner prn

## 2012-11-05 ENCOUNTER — Encounter (INDEPENDENT_AMBULATORY_CARE_PROVIDER_SITE_OTHER): Payer: Self-pay | Admitting: General Surgery

## 2012-11-05 ENCOUNTER — Ambulatory Visit (INDEPENDENT_AMBULATORY_CARE_PROVIDER_SITE_OTHER): Payer: BC Managed Care – PPO | Admitting: General Surgery

## 2012-11-05 VITALS — BP 140/88 | HR 100 | Temp 98.0°F | Resp 16 | Ht 73.0 in | Wt 182.0 lb

## 2012-11-05 DIAGNOSIS — K5732 Diverticulitis of large intestine without perforation or abscess without bleeding: Secondary | ICD-10-CM

## 2012-11-05 DIAGNOSIS — K5792 Diverticulitis of intestine, part unspecified, without perforation or abscess without bleeding: Secondary | ICD-10-CM

## 2012-11-05 NOTE — Progress Notes (Signed)
Subjective:     Patient ID: [name], male   DOB: [birth year], 56 y.o.   MRN: [id number]  HPI This patient follows up for about 6 weeks status post perforated diverticulitis was some free air which was treated nonoperatively. Since then he has resolved and has no abdominal pain. His bowels are functioning normally and he is tolerating regular diet. He has had 2 episodes of some left lower quadrant discomfort which only lasted for a day and then resolved. He has not had any fevers or chills or any further episodes of similar to his prior diverticulitis.  Review of Systems     Objective:   Physical Exam No distress and nontoxic-appearing His abdomen is soft and nontender exam    Assessment:     History of diverticulitis I have recommended elective sigmoid colectomy given his history of perforated diverticulitis. We had a long discussion during his last visit about the pros and cons of surgery and I explained that I would consider his first episode of diverticulitis with perforation of a complicated diverticulitis and I would recommend elective surgery to prevent recurrent episodes.  He has had a few mild episodes of left lower quadrant pain since then but he is not interested in surgery at this time. He feels well currently in although he is considering this he would like to hold off on surgery. He understands that he may require surgery with colostomy for recurrent episodes. He would like to take another month or so to think about his options and he will call me back if he is interested in elective surgery. Certainly he'll call back if he has any further episodes of left lower quadrant pain or fevers or chills.    Plan:     He will follow with me when necessary if he would like to pursue elective sigmoid colectomy or sooner prn symptoms

## 2012-11-13 ENCOUNTER — Encounter: Payer: Self-pay | Admitting: Internal Medicine

## 2012-12-04 ENCOUNTER — Encounter: Payer: Self-pay | Admitting: Internal Medicine

## 2012-12-04 ENCOUNTER — Ambulatory Visit (INDEPENDENT_AMBULATORY_CARE_PROVIDER_SITE_OTHER): Payer: BC Managed Care – PPO | Admitting: Internal Medicine

## 2012-12-04 VITALS — BP 128/64 | HR 80 | Ht 72.3 in | Wt 183.0 lb

## 2012-12-04 DIAGNOSIS — R933 Abnormal findings on diagnostic imaging of other parts of digestive tract: Secondary | ICD-10-CM

## 2012-12-04 DIAGNOSIS — R1032 Left lower quadrant pain: Secondary | ICD-10-CM

## 2012-12-04 DIAGNOSIS — K5732 Diverticulitis of large intestine without perforation or abscess without bleeding: Secondary | ICD-10-CM

## 2012-12-04 MED ORDER — CIPROFLOXACIN HCL 500 MG PO TABS: 500.0000 mg | ORAL_TABLET | Freq: Two times a day (BID) | ORAL | Status: DC

## 2012-12-04 MED ORDER — METRONIDAZOLE 500 MG PO TABS: 500.0000 mg | ORAL_TABLET | Freq: Two times a day (BID) | ORAL | Status: DC

## 2012-12-04 NOTE — Progress Notes (Signed)
HISTORY OF PRESENT ILLNESS:  [name] is a 57 y.o. male with a history of multiple advanced adenomatous colon polyps (index exam March 2011) who presents today regarding recent problems with complicated diverticulitis and wishes opinion regarding prospects of surgical therapy. I have reviewed the electronic medical record, x-rays, and laboratories. Patient was hospitalized for 11 days in late October and early November 2013 for acute abdominal pain. Was found to have leukocytosis and CT scan demonstrating perforated diverticulitis. He has been seen by surgery (Dr. Lodema Pilot) and elective sigmoid resection recommended. Over the past month patient reports ongoing problems with left lower quadrant discomfort as much as 5 times per day. He denies fevers or bleeding. He initially lost about 12 pounds but has gained 4 pounds back. He was due for surveillance colonoscopy this March. No other GI complaints  REVIEW OF SYSTEMS:  All non-GI ROS negative after complete review  Past Medical History  Diagnosis Date  . ADD (attention deficit disorder)     adult- per psych  . Insomnia     per psych  . Heart murmur     Echo 2003 "thickened and somehow myomateous MV, mild MR"  . Fall 4-10    fx left hip and wrist  . Diverticulitis     Perforated sigmoid diverticulitis     Past Surgical History  Procedure Date  . Nose biopsy 2011    Dr Terri Piedra, no cancer  . Surgery for left hip and wrist   . Broken wrist     wrist and femur    Social History MURICE BARBAR  reports that he has never smoked. He has never used smokeless tobacco. He reports that he drinks alcohol. He reports that he does not use illicit drugs.  family history includes Cancer in his father and mother.  There is no history of Diabetes, and Coronary artery disease, and Hypertension, and Colon cancer, .  No Known Allergies     PHYSICAL EXAMINATION: Vital signs: BP 128/64  Pulse 80  Ht 6' 0.3" (1.836 m)  Wt 183 lb (83.008 kg)   BMI 24.61 kg/m2  Constitutional: generally well-appearing, no acute distress Psychiatric: alert and oriented x3, cooperative Eyes: extraocular movements intact, anicteric, conjunctiva pink Mouth: oral pharynx moist, no lesions Neck: supple no lymphadenopathy Cardiovascular: heart regular rate and rhythm, no murmur Lungs: clear to auscultation bilaterally Abdomen: soft, with moderate tenderness in left lower quadrant to palpation, nondistended, no obvious ascites, no peritoneal signs, normal bowel sounds, no organomegaly Rectal: Omitted Extremities: no lower extremity edema bilaterally Skin: no lesions on visible extremities Neuro: No focal deficits.   ASSESSMENT:  #1. Complicated sigmoid diverticulitis (perforation with long hospitalization). He continues with left lower quadrant pain. Rule out ongoing, smoldering diverticular disease. I do feel that elective (if possible) surgical resection would be best for this particular patient to avoid recurrent disease or other potential complications, as we discussed in detail. #2. History of multiple and advanced adenomatous. Due for followup March 2014   PLAN:  #1. Ciprofloxacin 500 mg by mouth twice a day x10 days #2. Metronidazole 500 mg by mouth twice a day x10 days #3. Contrast-enhanced CT scan of the abdomen and pelvis to evaluate ongoing pain after recent diverticular perforation #4. Plans for surveillance colonoscopy postponed at this time until more acute issues with diverticular disease resolved

## 2012-12-04 NOTE — Patient Instructions (Addendum)
We have sent the following medications to your pharmacy for you to pick up at your convenience: Flagyl and Cipro  You have been scheduled for a CT scan of the abdomen and pelvis at [facility] ([address]---this is in the same building as Architectural technologist).   You are scheduled on 12-08-12 at 9:00am. You should arrive 15 minutes prior to your appointment time for registration. Please follow the written instructions below on the day of your exam:  WARNING: IF YOU ARE ALLERGIC TO IODINE/X-RAY DYE, PLEASE NOTIFY RADIOLOGY IMMEDIATELY AT [phone]! YOU WILL BE GIVEN A 13 HOUR PREMEDICATION PREP.  1) Do not eat or drink anything after 5:00am (4 hours prior to your test) 2) You have been given 2 bottles of oral contrast to drink. The solution may taste better if refrigerated, but do NOT add ice or any other liquid to this solution. Shake well before drinking.    Drink 1 bottle of contrast @ 7:00am (2 hours prior to your exam)  Drink 1 bottle of contrast @ 8:00am (1 hour prior to your exam)  You may take any medications as prescribed with a small amount of water except for the following: Metformin, Glucophage, Glucovance, Avandamet, Riomet, Fortamet, Actoplus Met, Janumet, Glumetza or Metaglip. The above medications must be held the day of the exam AND 48 hours after the exam.  The purpose of you drinking the oral contrast is to aid in the visualization of your intestinal tract. The contrast solution may cause some diarrhea. Before your exam is started, you will be given a small amount of fluid to drink. Depending on your individual set of symptoms, you may also receive an intravenous injection of x-ray contrast/dye. Plan on being at [facility] for 30 minutes or long, depending on the type of exam you are having performed.  If you have any questions regarding your exam or if you need to reschedule, you may call the CT department at [phone] between the hours of 8:00 am and  5:00 pm, Monday-Friday.  ________________________________________________________________________

## 2012-12-08 ENCOUNTER — Other Ambulatory Visit: Payer: Self-pay | Admitting: Internal Medicine

## 2012-12-08 ENCOUNTER — Ambulatory Visit (INDEPENDENT_AMBULATORY_CARE_PROVIDER_SITE_OTHER)
Admission: RE | Admit: 2012-12-08 | Discharge: 2012-12-08 | Disposition: A | Discharge status: Home / Self Care | Payer: BC Managed Care – PPO | Source: Ambulatory Visit | Attending: Internal Medicine | Admitting: Internal Medicine

## 2012-12-08 DIAGNOSIS — R1032 Left lower quadrant pain: Secondary | ICD-10-CM

## 2012-12-08 DIAGNOSIS — R933 Abnormal findings on diagnostic imaging of other parts of digestive tract: Secondary | ICD-10-CM

## 2012-12-08 DIAGNOSIS — K5732 Diverticulitis of large intestine without perforation or abscess without bleeding: Secondary | ICD-10-CM

## 2012-12-08 MED ORDER — METRONIDAZOLE 500 MG PO TABS: 500.0000 mg | ORAL_TABLET | Freq: Two times a day (BID) | ORAL | Status: DC

## 2012-12-08 MED ORDER — CIPROFLOXACIN HCL 500 MG PO TABS: 500.0000 mg | ORAL_TABLET | Freq: Two times a day (BID) | ORAL | Status: DC

## 2012-12-08 MED ORDER — IOHEXOL 300 MG/ML  SOLN
100.0000 mL | Freq: Once | INTRAMUSCULAR | Status: AC | PRN
  Administered 2012-12-08: 100 mL via INTRAVENOUS

## 2012-12-22 ENCOUNTER — Encounter: Payer: Self-pay | Admitting: Internal Medicine

## 2012-12-22 ENCOUNTER — Ambulatory Visit (INDEPENDENT_AMBULATORY_CARE_PROVIDER_SITE_OTHER): Payer: BC Managed Care – PPO | Admitting: Internal Medicine

## 2012-12-22 VITALS — BP 104/80 | HR 88 | Ht 73.0 in | Wt 183.6 lb

## 2012-12-22 DIAGNOSIS — R1032 Left lower quadrant pain: Secondary | ICD-10-CM

## 2012-12-22 DIAGNOSIS — R933 Abnormal findings on diagnostic imaging of other parts of digestive tract: Secondary | ICD-10-CM

## 2012-12-22 DIAGNOSIS — K5732 Diverticulitis of large intestine without perforation or abscess without bleeding: Secondary | ICD-10-CM

## 2012-12-22 MED ORDER — AMOXICILLIN-POT CLAVULANATE 875-125 MG PO TABS: 1.0000 | ORAL_TABLET | Freq: Two times a day (BID) | ORAL | Status: DC

## 2012-12-22 NOTE — Patient Instructions (Addendum)
You have an appointment with Dr. Biagio Quint Thursday 12-24-12 at 9:45am.  Please arrive 15 minutes early.  We have sent the following medications to your pharmacy for you to pick up at your convenience:  Augmentin

## 2012-12-22 NOTE — Progress Notes (Signed)
HISTORY OF PRESENT ILLNESS:  [name] is a 57 y.o. male with a history of advanced adenomatous colon polyps (index examination March 2011) who presents today for followup regarding acute diverticulitis. The patient was hospitalized for 11 days in October 2013 an early November 2013 for acute diverticulitis. He was seen by general surgery and elective surgical resection recommended as he had evidence of contained perforation. Patient was seen by myself, his request, 12/04/2012 for second opinion. At that time, it was clear that the patient was continuing to have abdominal complaints consistent with persisting diverticulitis. He was placed on ciprofloxacin and metronidazole 500 mg each twice a day. Followup CT scan on 12/08/2012 confirmed persisting diverticulitis with localized fluid collection. He has continued on antibiotics since that time and follows up today. He is accompanied by his wife. He continues to have problems with varying degrees of abdominal discomfort daily. No fevers or change in bowel habits. No bleeding. His weight has been stable. No new complaints. He is discouraged and is not feeling better.  REVIEW OF SYSTEMS:  All non-GI ROS negative  Past Medical History  Diagnosis Date  . ADD (attention deficit disorder)     adult- per psych  . Insomnia     per psych  . Heart murmur     Echo 2003 "thickened and somehow myomateous MV, mild MR"  . Fall 4-10    fx left hip and wrist  . Diverticulitis     Perforated sigmoid diverticulitis   . Adenomatous colon polyp     Past Surgical History  Procedure Date  . Nose biopsy 2011    Dr Terri Piedra, no cancer  . Hip surgery     left  . Wrist surgery     left    Social History [name]  reports that he has never smoked. He has never used smokeless tobacco. He reports that he drinks alcohol. He reports that he does not use illicit drugs.  family history includes Breast cancer in his mother; Heart failure in his mother; and Lung  cancer in his father.  There is no history of Diabetes, and Hypertension, and Colon cancer, .  No Known Allergies     PHYSICAL EXAMINATION: Vital signs: BP 104/80  Pulse 88  Ht 6\' 1"  (1.854 m)  Wt 183 lb 9.6 oz (83.28 kg)  BMI 24.22 kg/m2 General: Well-developed, well-nourished, no acute distress HEENT: Sclerae are anicteric, conjunctiva pink. Oral mucosa intact Lungs: Clear Heart: Regular Abdomen: soft, tenderness in the lower quadrants bilaterally, no rebound, nondistended, no obvious ascites, no peritoneal signs, normal bowel sounds. No organomegaly. Extremities: No edema Psychiatric: alert and oriented x3. Cooperative    ASSESSMENT:  #1. Persistent diverticulitis after an acute episode with contained perforation late October/early November 2013. Recent CT scan demonstrating persistent diverticulitis with small fluid collection. No clinical response to an additional 2 half weeks of antibiotics. At this point, he needs to return to see general surgery regarding surgical management   PLAN:  #1. Prescribe Augmentin 875 mg by mouth twice a day x2 weeks #2. Return to see Dr. Biagio Quint this week. #3. He will need surveillance colonoscopy sometime this year after fully recovering from, probable, diverticulitis surgery.

## 2012-12-24 ENCOUNTER — Ambulatory Visit (INDEPENDENT_AMBULATORY_CARE_PROVIDER_SITE_OTHER): Payer: BC Managed Care – PPO | Admitting: General Surgery

## 2012-12-24 ENCOUNTER — Encounter (INDEPENDENT_AMBULATORY_CARE_PROVIDER_SITE_OTHER): Payer: Self-pay | Admitting: General Surgery

## 2012-12-24 VITALS — BP 126/84 | HR 68 | Temp 97.0°F | Resp 16 | Ht 73.0 in | Wt 180.4 lb

## 2012-12-24 DIAGNOSIS — K5732 Diverticulitis of large intestine without perforation or abscess without bleeding: Secondary | ICD-10-CM

## 2012-12-24 NOTE — Progress Notes (Signed)
Patient ID: [name], male   DOB: 02-03-1956, 57 y.o.   MRN: [id number]  Chief Complaint  Patient presents with  . Follow-up    possible recurrent diverticulitis    HPI [name] is a 57 y.o. male.  This patient is known to me for prior hospitalization for diverticulitis with microperforation abscess. He improved with nonoperative management initially and has returned to work but since his initial hospitalization in October he has had persistent mild discomfort in his left lower quadrant and has had several courses of antibiotics with both Augmentin and courses of Cipro and Flagyl. He generally feels well and denies any fevers or chills or significant abdominal pain but he does have some mild discomfort which persisted in his left lower quadrant. He saw a gastroenterologist for another opinion about possible surgery and at that time, he was having left lower quadrant pain consistent with persistent diverticulitis. He also had a CT scan which demonstrated continued inflammatory changes in the sigmoid colon region. He comes in today to discuss surgery. HPI  Past Medical History  Diagnosis Date  . ADD (attention deficit disorder)     adult- per psych  . Insomnia     per psych  . Heart murmur     Echo 2003 "thickened and somehow myomateous MV, mild MR"  . Fall 4-10    fx left hip and wrist  . Diverticulitis     Perforated sigmoid diverticulitis   . Adenomatous colon polyp     Past Surgical History  Procedure Date  . Nose biopsy 2011    Dr Terri Piedra, no cancer  . Hip surgery     left  . Wrist surgery     left    Family History  Problem Relation Age of Onset  . Breast cancer Mother   . Lung cancer Father     smoker  . Diabetes Neg Hx   . Heart failure Mother   . Hypertension Neg Hx   . Colon cancer Neg Hx     Social History History  Substance Use Topics  . Smoking status: Never Smoker   . Smokeless tobacco: Never Used  . Alcohol Use: Yes     Comment: weekly    No  Known Allergies  Current Outpatient Prescriptions  Medication Sig Dispense Refill  . amoxicillin-clavulanate (AUGMENTIN) 875-125 MG per tablet Take 1 tablet by mouth 2 (two) times daily.  28 tablet  0  . amphetamine-dextroamphetamine (ADDERALL) 30 MG tablet Take 30 mg by mouth daily.        . Multiple Vitamins-Minerals (CENTRUM ULTRA MENS) TABS Take by mouth. Three times a week--Monday, Wednesday, Friday       . Omega-3 Fatty Acids (FISH OIL) 1200 MG CAPS Take by mouth. Three times a week--Monday, Wednesday, Friday.       . traZODone (DESYREL) 100 MG tablet Take 50 mg by mouth at bedtime.          Review of Systems Review of Systems All other review of systems negative or noncontributory except as stated in the HPI' Blood pressure 126/84, pulse 68, temperature 97 F (36.1 C), temperature source Temporal, resp. rate 16, height 6\' 1"  (1.854 m), weight 180 lb 6.4 oz (81.829 kg).  Physical Exam Physical Exam Physical Exam  Vitals reviewed. Constitutional: He is oriented to person, place, and time. He appears well-developed and well-nourished. No distress.  HENT:  Head: Normocephalic and atraumatic.  Mouth/Throat: No oropharyngeal exudate.  Eyes: Conjunctivae and EOM are normal.  Pupils are equal, round, and reactive to light. Right eye exhibits no discharge. Left eye exhibits no discharge. No scleral icterus.  Neck: Normal range of motion. No tracheal deviation present.  Cardiovascular: Normal rate, regular rhythm and normal heart sounds.   Pulmonary/Chest: Effort normal and breath sounds normal. No stridor. No respiratory distress. He has no wheezes. He has no rales. He exhibits no tenderness.  Abdominal: Soft. Bowel sounds are normal. He exhibits no distension and no mass. There is no tenderness. There is no rebound and no guarding.  Musculoskeletal: Normal range of motion. He exhibits no edema and no tenderness.  Neurological: He is alert and oriented to person, place, and time.  Skin:  Skin is warm and dry. No rash noted. He is not diaphoretic. No erythema. No pallor.  Psychiatric: He has a normal mood and affect. His behavior is normal. Judgment and thought content normal.    Data Reviewed   Assessment    Sigmoid diverticulitis He had an episode of complicated diverticulitis and continues to have symptoms of chronic and persistent diverticulitis. Recently he had a small 1-1/2 cm abscess in the area of his prior infection and continues on antibiotics. I again discussed the options of surgery versus continued observation and I have recommended again sigmoid colectomy. We discussed the options of laparoscopic and open sigmoid colectomy with or without ostomy. I explained that there is a higher chance that we may not heal due to this laparoscopically given the inflammatory change and I also explained that there is a very good chance that he may need diverting ostomy if there is significant inflammatory changes or purulence at the time of surgery. We discussed the other risks of the procedure including infection, bleeding, pain, scarring, recurrence, need for ostomy, injury to bowel or surrounding structures, ureteral injury, anastomotic leak and he expressed understanding and would like to proceed with sigmoid colectomy.    Plan    We will go ahead and set him up for laparoscopic sigmoid colectomy as soon as possible and I will ask for ureteral stents to be placed preoperatively given the persistent inflammatory change in the sigmoid colon region       Subrina Vecchiarelli DAVID 12/24/2012, 11:37 AM

## 2012-12-28 ENCOUNTER — Other Ambulatory Visit: Payer: Self-pay | Admitting: Urology

## 2013-01-04 ENCOUNTER — Telehealth: Payer: Self-pay | Admitting: Internal Medicine

## 2013-01-04 NOTE — Telephone Encounter (Signed)
Left message with patient who wanted to know if he needed any further rx for Augmentin after finishing course.  I left message that if he felt better to just finish the course and he should be fine.  I said if he finished the course and was not better, to give [place] a call.

## 2013-01-27 ENCOUNTER — Encounter (HOSPITAL_COMMUNITY): Payer: Self-pay | Admitting: Pharmacy Technician

## 2013-01-27 NOTE — Pre-Procedure Instructions (Signed)
Dr. Biagio Quint please put orders in [facility] as patient coming for pre-op appt. On 01/29/2013.

## 2013-01-28 ENCOUNTER — Encounter: Payer: Self-pay | Admitting: Internal Medicine

## 2013-01-29 ENCOUNTER — Encounter (HOSPITAL_COMMUNITY)
Admission: RE | Admit: 2013-01-29 | Discharge: 2013-01-29 | Disposition: A | Discharge status: Home / Self Care | Payer: BC Managed Care – PPO | Source: Ambulatory Visit | Attending: General Surgery | Admitting: General Surgery

## 2013-01-29 ENCOUNTER — Encounter (HOSPITAL_COMMUNITY): Payer: Self-pay

## 2013-01-29 DIAGNOSIS — Z01812 Encounter for preprocedural laboratory examination: Secondary | ICD-10-CM | POA: Insufficient documentation

## 2013-01-29 DIAGNOSIS — Z01810 Encounter for preprocedural cardiovascular examination: Secondary | ICD-10-CM | POA: Insufficient documentation

## 2013-01-29 DIAGNOSIS — R030 Elevated blood-pressure reading, without diagnosis of hypertension: Secondary | ICD-10-CM | POA: Insufficient documentation

## 2013-01-29 DIAGNOSIS — K5732 Diverticulitis of large intestine without perforation or abscess without bleeding: Secondary | ICD-10-CM | POA: Insufficient documentation

## 2013-01-29 LAB — BASIC METABOLIC PANEL
CO2: 29 mEq/L (ref 19–32)
Calcium: 9.5 mg/dL (ref 8.4–10.5)
Creatinine, Ser: 0.95 mg/dL (ref 0.50–1.35)
GFR calc non Af Amer: >90 mL/min (ref >90)
Glucose, Bld: 124 mg/dL — ABNORMAL HIGH (ref 70–99)
Sodium: 137 mEq/L (ref 135–145)

## 2013-01-29 LAB — CBC
MCH: 31 pg (ref 26.0–34.0)
MCHC: 34 g/dL (ref 30.0–36.0)
MCV: 91.2 fL (ref 78.0–100.0)
Platelets: 227 10*3/uL (ref 150–400)
RBC: 5.33 MIL/uL (ref 4.22–5.81)

## 2013-01-29 LAB — SURGICAL PCR SCREEN: MRSA, PCR: NEGATIVE

## 2013-01-29 NOTE — Patient Instructions (Addendum)
YOUR SURGERY IS SCHEDULED AT [facility]  ON:  Thursday  3/27  REPORT TO [facility] AT:  5:15 AM      PHONE # FOR SHORT STAY IS [phone]  DO NOT EAT OR DRINK ANYTHING AFTER MIDNIGHT THE NIGHT BEFORE YOUR SURGERY.  YOU MAY BRUSH YOUR TEETH, RINSE OUT YOUR MOUTH--BUT NO WATER, NO FOOD, NO CHEWING GUM, NO MINTS, NO CANDIES, NO CHEWING TOBACCO.  PLEASE TAKE THE FOLLOWING MEDICATIONS THE AM OF YOUR SURGERY WITH A FEW SIPS OF WATER:  NO MEDICINES TO TAKE  IF YOU USE INHALERS--USE YOUR INHALERS THE AM OF YOUR SURGERY AND BRING INHALERS TO THE HOSPITAL.    IF YOU ARE DIABETIC:  DO NOT TAKE ANY DIABETIC MEDICATIONS THE AM OF YOUR SURGERY.  IF YOU TAKE INSULIN IN THE EVENINGS--PLEASE ONLY TAKE 1/2 NORMAL EVENING DOSE THE NIGHT BEFORE YOUR SURGERY.  NO INSULIN THE AM OF YOUR SURGERY.  IF YOU HAVE SLEEP APNEA AND USE CPAP OR BIPAP--PLEASE BRING THE MASK AND THE TUBING.  DO NOT BRING YOUR MACHINE.  DO NOT BRING VALUABLES, MONEY, CREDIT CARDS.  DO NOT WEAR JEWELRY, MAKE-UP, NAIL POLISH AND NO METAL PINS OR CLIPS IN YOUR HAIR. CONTACT LENS, DENTURES / PARTIALS, GLASSES SHOULD NOT BE WORN TO SURGERY AND IN MOST CASES-HEARING AIDS WILL NEED TO BE REMOVED.  BRING YOUR GLASSES CASE, ANY EQUIPMENT NEEDED FOR YOUR CONTACT LENS. FOR PATIENTS ADMITTED TO THE HOSPITAL--CHECK OUT TIME THE DAY OF DISCHARGE IS 11:00 AM.  ALL INPATIENT ROOMS ARE PRIVATE - WITH BATHROOM, TELEPHONE, TELEVISION AND WIFI INTERNET.  IF YOU ARE BEING DISCHARGED THE SAME DAY OF YOUR SURGERY--YOU CAN NOT DRIVE YOURSELF HOME--AND SHOULD NOT GO HOME ALONE BY TAXI OR BUS.  NO DRIVING OR OPERATING MACHINERY FOR 24 HOURS FOLLOWING ANESTHESIA / PAIN MEDICATIONS.  PLEASE MAKE ARRANGEMENTS FOR SOMEONE TO BE WITH YOU AT HOME THE FIRST 24 HOURS AFTER SURGERY. RESPONSIBLE DRIVER'S NAME___________________________                                               PHONE #   _______________________                                PLEASE READ OVER ANY  FACT SHEETS THAT YOU WERE GIVEN: MRSA INFORMATION, BLOOD TRANSFUSION INFORMATION FAILURE TO FOLLOW THESE INSTRUCTIONS MAY RESULT IN THE CANCELLATION OF YOUR SURGERY. PATIENT SIGNATURE_________________________________

## 2013-01-29 NOTE — Pre-Procedure Instructions (Addendum)
KRISTEN AT DR. Delice Lesch OFFICE NOTIFIED THAT PT'S PREOP VISIT AT [facility] IS TODAY --NO PREOP ORDERS IN EPIC FROM DR. LAYTON--ORDERS WERE REQUESTED ON 3/10 AND IF PT IS TO DO ANY BOWEL PREP - THE OFFICE WILL NEED TO CALL PT. LABS WERE DONE TODAY - PREOP - AS PER ANESTHESIOLOGIST'S GUIDELINES:  CBC, BMET, EKG (PT'S B/P ELEVATED TODAY-NO HX HYPERTENSION).  T/S WILL BE DONE DAY OF SURGERY.

## 2013-02-01 ENCOUNTER — Other Ambulatory Visit (INDEPENDENT_AMBULATORY_CARE_PROVIDER_SITE_OTHER): Payer: Self-pay | Admitting: General Surgery

## 2013-02-02 ENCOUNTER — Telehealth (INDEPENDENT_AMBULATORY_CARE_PROVIDER_SITE_OTHER): Payer: Self-pay | Admitting: *Deleted

## 2013-02-02 NOTE — Telephone Encounter (Signed)
Called patient to let him know Biagio Quint MD is requesting him to have 2 fleet enemas the morning of surgery (02/11/13) and to begin on clear liquids the evening of 02/09/13.  Patient states understanding and agreeable.

## 2013-02-10 ENCOUNTER — Other Ambulatory Visit: Payer: Self-pay | Admitting: Urology

## 2013-02-10 NOTE — Progress Notes (Signed)
Called patient and reviewed need for two fleet enemas in AM. Reviewed that one should be sodium phosphate enema. He verbalizes understanding. To do enemas at home before arrival for surgery. Pt has been on clear liquid diet for 3/25 and 02/10/13.

## 2013-02-11 ENCOUNTER — Inpatient Hospital Stay (HOSPITAL_COMMUNITY): Payer: BC Managed Care – PPO

## 2013-02-11 ENCOUNTER — Encounter (HOSPITAL_COMMUNITY): Payer: Self-pay | Admitting: *Deleted

## 2013-02-11 ENCOUNTER — Encounter (HOSPITAL_COMMUNITY)
Admission: RE | Disposition: A | Discharge status: Home / Self Care | Payer: Self-pay | Source: Ambulatory Visit | Attending: General Surgery

## 2013-02-11 ENCOUNTER — Inpatient Hospital Stay (HOSPITAL_COMMUNITY)
Admission: RE | Admit: 2013-02-11 | Discharge: 2013-02-16 | DRG: 149 | Disposition: A | Discharge status: Home / Self Care | Payer: BC Managed Care – PPO | Source: Ambulatory Visit | Attending: General Surgery | Admitting: General Surgery

## 2013-02-11 ENCOUNTER — Inpatient Hospital Stay (HOSPITAL_COMMUNITY): Payer: BC Managed Care – PPO | Admitting: *Deleted

## 2013-02-11 DIAGNOSIS — K5732 Diverticulitis of large intestine without perforation or abscess without bleeding: Principal | ICD-10-CM | POA: Diagnosis present

## 2013-02-11 DIAGNOSIS — G4700 Insomnia, unspecified: Secondary | ICD-10-CM | POA: Diagnosis present

## 2013-02-11 DIAGNOSIS — K5792 Diverticulitis of intestine, part unspecified, without perforation or abscess without bleeding: Secondary | ICD-10-CM | POA: Diagnosis present

## 2013-02-11 DIAGNOSIS — Z01812 Encounter for preprocedural laboratory examination: Secondary | ICD-10-CM

## 2013-02-11 DIAGNOSIS — F988 Other specified behavioral and emotional disorders with onset usually occurring in childhood and adolescence: Secondary | ICD-10-CM | POA: Diagnosis present

## 2013-02-11 HISTORY — PX: LAPAROSCOPIC SIGMOID COLECTOMY: SHX5928

## 2013-02-11 LAB — TYPE AND SCREEN: ABO/RH(D): O POS

## 2013-02-11 LAB — ABO/RH: ABO/RH(D): O POS

## 2013-02-11 SURGERY — COLECTOMY, SIGMOID, LAPAROSCOPIC
Anesthesia: General | Wound class: Clean Contaminated

## 2013-02-11 MED ORDER — SODIUM CHLORIDE 0.9 % IV SOLN
1.0000 g | INTRAVENOUS | Status: AC
  Administered 2013-02-11: 1 g via INTRAVENOUS

## 2013-02-11 MED ORDER — SODIUM CHLORIDE 0.9 % IJ SOLN: 9.0000 mL | INTRAMUSCULAR | Status: DC | PRN

## 2013-02-11 MED ORDER — ONDANSETRON HCL 4 MG PO TABS: 4.0000 mg | ORAL_TABLET | Freq: Four times a day (QID) | ORAL | Status: DC | PRN

## 2013-02-11 MED ORDER — BUPIVACAINE HCL 0.25 % IJ SOLN
INTRAMUSCULAR | Status: DC | PRN
  Administered 2013-02-11: 21.5 mL

## 2013-02-11 MED ORDER — OXYCODONE HCL 5 MG/5ML PO SOLN
5.0000 mg | Freq: Once | ORAL | Status: DC | PRN
  Filled 2013-02-11: qty 5

## 2013-02-11 MED ORDER — OXYCODONE HCL 5 MG PO TABS: 5.0000 mg | ORAL_TABLET | Freq: Once | ORAL | Status: DC | PRN

## 2013-02-11 MED ORDER — DEXAMETHASONE SODIUM PHOSPHATE 10 MG/ML IJ SOLN
INTRAMUSCULAR | Status: DC | PRN
  Administered 2013-02-11: 5 mg via INTRAVENOUS

## 2013-02-11 MED ORDER — LACTATED RINGERS IV SOLN
INTRAVENOUS | Status: DC | PRN
  Administered 2013-02-11 (×4): via INTRAVENOUS

## 2013-02-11 MED ORDER — DIPHENHYDRAMINE HCL 50 MG/ML IJ SOLN: 12.5000 mg | Freq: Four times a day (QID) | INTRAMUSCULAR | Status: DC | PRN

## 2013-02-11 MED ORDER — SODIUM CHLORIDE 0.9 % IV SOLN
1.0000 g | INTRAVENOUS | Status: AC
  Administered 2013-02-12: 1 g via INTRAVENOUS
  Filled 2013-02-11 (×2): qty 1

## 2013-02-11 MED ORDER — MORPHINE SULFATE (PF) 1 MG/ML IV SOLN
INTRAVENOUS | Status: DC
  Administered 2013-02-11 (×2): via INTRAVENOUS
  Administered 2013-02-11: 7.5 mg via INTRAVENOUS
  Administered 2013-02-12: 6 mg via INTRAVENOUS
  Administered 2013-02-12: 3 mg via INTRAVENOUS
  Administered 2013-02-12: 22:00:00 via INTRAVENOUS
  Administered 2013-02-12: 4.35 mg via INTRAVENOUS
  Administered 2013-02-12: 09:00:00 via INTRAVENOUS
  Administered 2013-02-12: 6 mg via INTRAVENOUS
  Administered 2013-02-13 (×3): 3 mg via INTRAVENOUS
  Administered 2013-02-13: 4.2 mg via INTRAVENOUS
  Administered 2013-02-13: 3 mg via INTRAVENOUS
  Filled 2013-02-11 (×3): qty 25

## 2013-02-11 MED ORDER — ACETAMINOPHEN 10 MG/ML IV SOLN
INTRAVENOUS | Status: DC | PRN
  Administered 2013-02-11: 1000 mg via INTRAVENOUS

## 2013-02-11 MED ORDER — ENOXAPARIN SODIUM 40 MG/0.4ML ~~LOC~~ SOLN
40.0000 mg | SUBCUTANEOUS | Status: DC
  Administered 2013-02-12 – 2013-02-13 (×2): 40 mg via SUBCUTANEOUS
  Filled 2013-02-11 (×3): qty 0.4

## 2013-02-11 MED ORDER — LACTATED RINGERS IR SOLN
Status: DC | PRN
  Administered 2013-02-11 (×2): 1000 mL
  Administered 2013-02-11: 3000 mL

## 2013-02-11 MED ORDER — STERILE WATER FOR IRRIGATION IR SOLN
Status: DC | PRN
  Administered 2013-02-11: 3000 mL

## 2013-02-11 MED ORDER — GLYCOPYRROLATE 0.2 MG/ML IJ SOLN
INTRAMUSCULAR | Status: DC | PRN
  Administered 2013-02-11: .6 mg via INTRAVENOUS

## 2013-02-11 MED ORDER — ACETAMINOPHEN 10 MG/ML IV SOLN: 1000.0000 mg | Freq: Once | INTRAVENOUS | Status: DC | PRN

## 2013-02-11 MED ORDER — ROCURONIUM BROMIDE 100 MG/10ML IV SOLN
INTRAVENOUS | Status: DC | PRN
  Administered 2013-02-11: 50 mg via INTRAVENOUS

## 2013-02-11 MED ORDER — 0.9 % SODIUM CHLORIDE (POUR BTL) OPTIME
TOPICAL | Status: DC | PRN
  Administered 2013-02-11: 2000 mL

## 2013-02-11 MED ORDER — HYDROMORPHONE HCL PF 1 MG/ML IJ SOLN
INTRAMUSCULAR | Status: DC | PRN
  Administered 2013-02-11 (×4): 0.5 mg via INTRAVENOUS

## 2013-02-11 MED ORDER — DIPHENHYDRAMINE HCL 12.5 MG/5ML PO ELIX: 12.5000 mg | ORAL_SOLUTION | Freq: Four times a day (QID) | ORAL | Status: DC | PRN

## 2013-02-11 MED ORDER — ONDANSETRON HCL 4 MG/2ML IJ SOLN
4.0000 mg | Freq: Four times a day (QID) | INTRAMUSCULAR | Status: DC | PRN
  Administered 2013-02-11: 4 mg via INTRAVENOUS

## 2013-02-11 MED ORDER — SUFENTANIL CITRATE 50 MCG/ML IV SOLN
INTRAVENOUS | Status: DC | PRN
  Administered 2013-02-11 (×2): 5 ug via INTRAVENOUS
  Administered 2013-02-11 (×2): 10 ug via INTRAVENOUS
  Administered 2013-02-11: 5 ug via INTRAVENOUS
  Administered 2013-02-11 (×2): 10 ug via INTRAVENOUS
  Administered 2013-02-11 (×4): 5 ug via INTRAVENOUS
  Administered 2013-02-11: 10 ug via INTRAVENOUS
  Administered 2013-02-11 (×3): 5 ug via INTRAVENOUS
  Administered 2013-02-11 (×2): 10 ug via INTRAVENOUS
  Administered 2013-02-11: 5 ug via INTRAVENOUS

## 2013-02-11 MED ORDER — CISATRACURIUM BESYLATE (PF) 10 MG/5ML IV SOLN
INTRAVENOUS | Status: DC | PRN
  Administered 2013-02-11: 2 mg via INTRAVENOUS
  Administered 2013-02-11 (×2): 4 mg via INTRAVENOUS
  Administered 2013-02-11: 6 mg via INTRAVENOUS

## 2013-02-11 MED ORDER — LACTATED RINGERS IV SOLN: INTRAVENOUS | Status: DC

## 2013-02-11 MED ORDER — NALOXONE HCL 0.4 MG/ML IJ SOLN: 0.4000 mg | INTRAMUSCULAR | Status: DC | PRN

## 2013-02-11 MED ORDER — MEPERIDINE HCL 50 MG/ML IJ SOLN: 6.2500 mg | INTRAMUSCULAR | Status: DC | PRN

## 2013-02-11 MED ORDER — LIDOCAINE-EPINEPHRINE 1 %-1:100000 IJ SOLN
INTRAMUSCULAR | Status: DC | PRN
  Administered 2013-02-11: 21.5 mL

## 2013-02-11 MED ORDER — LIDOCAINE HCL (CARDIAC) 20 MG/ML IV SOLN
INTRAVENOUS | Status: DC | PRN
  Administered 2013-02-11: 50 mg via INTRAVENOUS

## 2013-02-11 MED ORDER — KCL IN DEXTROSE-NACL 20-5-0.45 MEQ/L-%-% IV SOLN
INTRAVENOUS | Status: DC
  Administered 2013-02-11 – 2013-02-14 (×7): via INTRAVENOUS
  Filled 2013-02-11 (×7): qty 1000

## 2013-02-11 MED ORDER — ONDANSETRON HCL 4 MG/2ML IJ SOLN
INTRAMUSCULAR | Status: DC | PRN
  Administered 2013-02-11: 4 mg via INTRAVENOUS

## 2013-02-11 MED ORDER — METOCLOPRAMIDE HCL 5 MG/ML IJ SOLN
INTRAMUSCULAR | Status: DC | PRN
  Administered 2013-02-11: 10 mg via INTRAVENOUS

## 2013-02-11 MED ORDER — NEOSTIGMINE METHYLSULFATE 1 MG/ML IJ SOLN
INTRAMUSCULAR | Status: DC | PRN
  Administered 2013-02-11: 4 mg via INTRAVENOUS

## 2013-02-11 MED ORDER — PROMETHAZINE HCL 25 MG/ML IJ SOLN: 6.2500 mg | INTRAMUSCULAR | Status: DC | PRN

## 2013-02-11 MED ORDER — STERILE WATER FOR IRRIGATION IR SOLN
Status: DC | PRN
  Administered 2013-02-11: 1500 mL

## 2013-02-11 MED ORDER — ONDANSETRON HCL 4 MG/2ML IJ SOLN
4.0000 mg | Freq: Four times a day (QID) | INTRAMUSCULAR | Status: DC | PRN
  Filled 2013-02-11: qty 2

## 2013-02-11 MED ORDER — HEPARIN SODIUM (PORCINE) 5000 UNIT/ML IJ SOLN
5000.0000 [IU] | Freq: Once | INTRAMUSCULAR | Status: AC
  Administered 2013-02-11: 5000 [IU] via SUBCUTANEOUS
  Filled 2013-02-11: qty 1

## 2013-02-11 MED ORDER — MIDAZOLAM HCL 5 MG/5ML IJ SOLN
INTRAMUSCULAR | Status: DC | PRN
  Administered 2013-02-11: 2 mg via INTRAVENOUS

## 2013-02-11 MED ORDER — PROPOFOL 10 MG/ML IV BOLUS
INTRAVENOUS | Status: DC | PRN
  Administered 2013-02-11: 190 mg via INTRAVENOUS

## 2013-02-11 MED ORDER — HYDROMORPHONE HCL PF 1 MG/ML IJ SOLN
0.2500 mg | INTRAMUSCULAR | Status: DC | PRN
  Administered 2013-02-11 (×2): 0.5 mg via INTRAVENOUS

## 2013-02-11 SURGICAL SUPPLY — 106 items
ADAPTER GOLDBERG URETERAL (ADAPTER) ×1 IMPLANT
ADPR CATH 15X14FR FL DRN BG (ADAPTER) ×2
APL SKNCLS STERI-STRIP NONHPOA (GAUZE/BANDAGES/DRESSINGS) ×2
APPLIER CLIP 5 13 M/L LIGAMAX5 (MISCELLANEOUS) ×3
APPLIER CLIP ROT 10 11.4 M/L (STAPLE)
APR CLP MED LRG 11.4X10 (STAPLE)
APR CLP MED LRG 5 ANG JAW (MISCELLANEOUS) ×2
BAG URINE DRAINAGE (UROLOGICAL SUPPLIES) ×1 IMPLANT
BENZOIN TINCTURE PRP APPL 2/3 (GAUZE/BANDAGES/DRESSINGS) ×1 IMPLANT
BLADE EXTENDED COATED 6.5IN (ELECTRODE) ×1 IMPLANT
BLADE HEX COATED 2.75 (ELECTRODE) ×3 IMPLANT
BLADE SURG SZ10 CARB STEEL (BLADE) IMPLANT
CANISTER SUCTION 2500CC (MISCELLANEOUS) ×3 IMPLANT
CATH FOLEY 2WAY SLVR  5CC 18FR (CATHETERS) ×1
CATH FOLEY 2WAY SLVR 5CC 18FR (CATHETERS) IMPLANT
CELLS DAT CNTRL 66122 CELL SVR (MISCELLANEOUS) IMPLANT
CHLORAPREP W/TINT 26ML (MISCELLANEOUS) ×4 IMPLANT
CLAMP ENDO BABCK 10MM (STAPLE) IMPLANT
CLIP APPLIE 5 13 M/L LIGAMAX5 (MISCELLANEOUS) IMPLANT
CLIP APPLIE ROT 10 11.4 M/L (STAPLE) IMPLANT
CLOTH BEACON ORANGE TIMEOUT ST (SAFETY) ×3 IMPLANT
CONNECTOR 5 IN 1 STRAIGHT STRL (MISCELLANEOUS) IMPLANT
COVER MAYO STAND STRL (DRAPES) ×3 IMPLANT
COVER SURGICAL LIGHT HANDLE (MISCELLANEOUS) ×6 IMPLANT
DECANTER SPIKE VIAL GLASS SM (MISCELLANEOUS) ×3 IMPLANT
DEVICE TROCAR PUNCTURE CLOSURE (ENDOMECHANICALS) IMPLANT
DRAPE LAPAROSCOPIC ABDOMINAL (DRAPES) ×3 IMPLANT
DRAPE LG THREE QUARTER DISP (DRAPES) ×1 IMPLANT
DRAPE UTILITY 15X26 (DRAPE) ×4 IMPLANT
DRAPE WARM FLUID 44X44 (DRAPE) ×6 IMPLANT
DRSG OPSITE POSTOP 4X6 (GAUZE/BANDAGES/DRESSINGS) ×1 IMPLANT
ELECT REM PT RETURN 9FT ADLT (ELECTROSURGICAL) ×3
ELECTRODE REM PT RTRN 9FT ADLT (ELECTROSURGICAL) ×2 IMPLANT
ENDO GIA UNIVERSAL XLG (ENDOMECHANICALS) IMPLANT
ENDOLOOP SUT PDS II  0 18 (SUTURE) ×1
ENDOLOOP SUT PDS II 0 18 (SUTURE) IMPLANT
ENSEAL DEVICE STD TIP 35CM (ENDOMECHANICALS) IMPLANT
GLOVE BIOGEL PI IND STRL 7.0 (GLOVE) ×2 IMPLANT
GLOVE BIOGEL PI INDICATOR 7.0 (GLOVE) ×1
GOWN STRL NON-REIN LRG LVL3 (GOWN DISPOSABLE) ×4 IMPLANT
GOWN STRL REIN XL XLG (GOWN DISPOSABLE) ×9 IMPLANT
HAND ACTIVATED (MISCELLANEOUS) IMPLANT
KIT BASIN OR (CUSTOM PROCEDURE TRAY) ×3 IMPLANT
LEGGING LITHOTOMY PAIR STRL (DRAPES) ×2 IMPLANT
LIGASURE IMPACT 36 18CM CVD LR (INSTRUMENTS) ×3 IMPLANT
NS IRRIG 1000ML POUR BTL (IV SOLUTION) ×6 IMPLANT
PENCIL BUTTON HOLSTER BLD 10FT (ELECTRODE) ×3 IMPLANT
RELOAD EGIA 60 MED/THCK PURPLE (STAPLE) ×3 IMPLANT
RELOAD STAPLE 60 MED/THCK ART (STAPLE) IMPLANT
RETRACTOR WND ALEXIS 18 MED (MISCELLANEOUS) IMPLANT
RTRCTR WOUND ALEXIS 18CM MED (MISCELLANEOUS)
SCISSORS LAP 5X35 DISP (ENDOMECHANICALS) IMPLANT
SEALER TISSUE G2 CVD JAW 35 (ENDOMECHANICALS) IMPLANT
SEALER TISSUE G2 CVD JAW 45CM (ENDOMECHANICALS)
SET IRRIG TUBING LAPAROSCOPIC (IRRIGATION / IRRIGATOR) ×1 IMPLANT
SLEEVE Z-THREAD 12X100MM (TROCAR) IMPLANT
SLEEVE Z-THREAD 5X100MM (TROCAR) IMPLANT
SOLUTION ANTI FOG 6CC (MISCELLANEOUS) ×3 IMPLANT
SPONGE GAUZE 4X4 12PLY (GAUZE/BANDAGES/DRESSINGS) ×2 IMPLANT
SPONGE LAP 18X18 X RAY DECT (DISPOSABLE) ×6 IMPLANT
STAPLER CIRC CVD 29MM 37CM (STAPLE) ×1 IMPLANT
STAPLER CUT CVD 40MM GREEN (STAPLE) ×1 IMPLANT
STAPLER VISISTAT 35W (STAPLE) ×3 IMPLANT
STENT CONTOUR 6FRX24X.038 (STENTS) ×1 IMPLANT
STRIP CLOSURE SKIN 1/2X4 (GAUZE/BANDAGES/DRESSINGS) ×1 IMPLANT
SUCTION POOLE TIP (SUCTIONS) ×3 IMPLANT
SUT MNCRL AB 4-0 PS2 18 (SUTURE) ×2 IMPLANT
SUT PDS AB 1 CT1 27 (SUTURE) IMPLANT
SUT PDS AB 1 CTX 36 (SUTURE) IMPLANT
SUT PDS AB 1 TP1 96 (SUTURE) ×2 IMPLANT
SUT PDS AB 4-0 SH 27 (SUTURE) IMPLANT
SUT PROLENE 2 0 BLUE (SUTURE) ×1 IMPLANT
SUT PROLENE 2 0 KS (SUTURE) IMPLANT
SUT SILK 2 0 (SUTURE) ×3
SUT SILK 2 0 SH (SUTURE) ×1 IMPLANT
SUT SILK 2 0 SH CR/8 (SUTURE) ×3 IMPLANT
SUT SILK 2-0 18XBRD TIE 12 (SUTURE) ×2 IMPLANT
SUT SILK 3 0 (SUTURE) ×3
SUT SILK 3 0 SH CR/8 (SUTURE) ×3 IMPLANT
SUT SILK 3-0 18XBRD TIE 12 (SUTURE) ×2 IMPLANT
SUT VIC AB 2-0 CT1 27 (SUTURE)
SUT VIC AB 2-0 CT1 27XBRD (SUTURE) IMPLANT
SUT VIC AB 3-0 PS2 18 (SUTURE)
SUT VIC AB 3-0 PS2 18XBRD (SUTURE) IMPLANT
SUT VIC AB 4-0 SH 18 (SUTURE) IMPLANT
SUT VICRYL 0 UR6 27IN ABS (SUTURE) ×1 IMPLANT
SYR 30ML LL (SYRINGE) ×3 IMPLANT
SYR BULB IRRIGATION 50ML (SYRINGE) ×3 IMPLANT
SYS LAPSCP GELPORT 120MM (MISCELLANEOUS) ×3
SYSTEM LAPSCP GELPORT 120MM (MISCELLANEOUS) IMPLANT
TOWEL OR 17X26 10 PK STRL BLUE (TOWEL DISPOSABLE) ×4 IMPLANT
TRAY FOLEY CATH 14FRSI W/METER (CATHETERS) ×2 IMPLANT
TRAY LAP CHOLE (CUSTOM PROCEDURE TRAY) ×3 IMPLANT
TROCAR BALLN 12MMX100 BLUNT (TROCAR) IMPLANT
TROCAR BLADELESS OPT 5 100 (ENDOMECHANICALS) IMPLANT
TROCAR HASSON GELL 12X100 (TROCAR) ×1 IMPLANT
TROCAR XCEL 12X100 BLDLESS (ENDOMECHANICALS) ×1 IMPLANT
TROCAR XCEL NON-BLD 11X100MML (ENDOMECHANICALS) ×1 IMPLANT
TROCAR Z-THREAD FIOS 11X100 BL (TROCAR) IMPLANT
TROCAR Z-THREAD FIOS 12X100MM (TROCAR) ×3 IMPLANT
TROCAR Z-THREAD FIOS 5X100MM (TROCAR) ×1 IMPLANT
TROCAR Z-THREAD SLEEVE 11X100 (TROCAR) IMPLANT
TUBING CONNECTING 10 (TUBING) ×2 IMPLANT
TUBING FILTER THERMOFLATOR (ELECTROSURGICAL) ×3 IMPLANT
YANKAUER SUCT BULB TIP 10FT TU (MISCELLANEOUS) ×3 IMPLANT
YANKAUER SUCT BULB TIP NO VENT (SUCTIONS) ×3 IMPLANT

## 2013-02-11 NOTE — H&P (Signed)
   [name] is an 57 y.o. male.  HPI: known to me for history of diverticulitis.  He had abscess and contained perforation which seemed to improve with conservative management but has had ongoing pain and inflammatory changes on CT.  Was not able to have cscope due to continued inflammation.  Has been on several courses of abx without improvement   Past Medical History  Diagnosis Date  . ADD (attention deficit disorder)     adult- per psych  . Insomnia     per psych  . Heart murmur     Echo 2003 "thickened and somehow myomateous MV, mild MR"  . Fall 4-10    fx left hip and wrist  . Diverticulitis     Perforated sigmoid diverticulitis   . Adenomatous colon polyp     Past Surgical History  Procedure Laterality Date  . Nose biopsy  2011    Dr Terri Piedra, no cancer  . Hip surgery      left  . Wrist surgery      left    Family History  Problem Relation Age of Onset  . Breast cancer Mother   . Lung cancer Father     smoker  . Diabetes Neg Hx   . Heart failure Mother   . Hypertension Neg Hx   . Colon cancer Neg Hx     Social History:  reports that he has never smoked. He has never used smokeless tobacco. He reports that  drinks alcohol. He reports that he does not use illicit drugs.  Allergies: No Known Allergies  Medications: I have reviewed the patient's current medications.  Results for orders placed during the hospital encounter of 02/11/13 (from the past 48 hour(s))  TYPE AND SCREEN     Status: None   Collection Time    02/11/13  5:45 AM      Result Value Range   ABO/RH(D) O POS     Antibody Screen NEG     Sample Expiration 02/14/2013      No results found.  @ROS @ Blood pressure 126/83, pulse 82, temperature 98.5 F (36.9 C), temperature source Oral, resp. rate 18, SpO2 100.00%. General appearance: alert, cooperative and no distress Resp: clear to auscultation bilaterally Cardio: regular rate and rhythm, S1, S2 normal, no murmur, click, rub or gallop GI:  soft, non-tender; bowel sounds normal; no masses,  no organomegaly Extremities: extremities normal, atraumatic, no cyanosis or edema Neurologic: Grossly normal  Assessment/Plan: Diverticulitis This has been refractory to medical management.  I have again discussed with him the option for lap or open sigmoidectomy.  He expressed understanding of the possibility for need for open surgery and even the significant risk for possible ostomy if ongoing inflammation or abscess.  We discussed the risks of infection, bleeding, pain, scarring, recurrence, injury to ureter or surrounding structures, anastamotic leak, hernia, and need for ostomy.  Will proceed with lap sigmoidectomy  [name] [name] 02/11/2013, 7:07 AM

## 2013-02-11 NOTE — Consult Note (Signed)
Urology Consult  CC: Diverticulitis  HPI: 57 year old male will be undergoing a lap assisted sigmoid colectomy today by Dr. Biagio Quint. I have been requested to place a ureteral catheter for ureteral identification preoperatively.  PMH: Past Medical History  Diagnosis Date  . ADD (attention deficit disorder)     adult- per psych  . Insomnia     per psych  . Heart murmur     Echo 2003 "thickened and somehow myomateous MV, mild MR"  . Fall 4-10    fx left hip and wrist  . Diverticulitis     Perforated sigmoid diverticulitis   . Adenomatous colon polyp     PSH: Past Surgical History  Procedure Laterality Date  . Nose biopsy  2011    Dr Terri Piedra, no cancer  . Hip surgery      left  . Wrist surgery      left    Allergies: No Known Allergies  Medications: Prescriptions prior to admission  Medication Sig Dispense Refill  . amphetamine-dextroamphetamine (ADDERALL) 30 MG tablet Take 30 mg by mouth daily before breakfast.       . Omega-3 Fatty Acids (FISH OIL) 1200 MG CAPS Take by mouth. Three times a week--Monday, Wednesday, Friday.      . traZODone (DESYREL) 100 MG tablet Take 50 mg by mouth at bedtime.       . Multiple Vitamins-Minerals (CENTRUM ULTRA MENS) TABS Take by mouth. Three times a week--Monday, Wednesday, Friday          Social History: History   Social History  . Marital Status: Married    Spouse Name: N/A    Number of Children: 3  . Years of Education: N/A   Occupational History  . [employer]  [employer]   Social History Main Topics  . Smoking status: Never Smoker   . Smokeless tobacco: Never Used  . Alcohol Use: Yes     Comment: weekly  . Drug Use: No  . Sexually Active: Not on file   Other Topics Concern  . Not on file   Social History Narrative             Family History: Family History  Problem Relation Age of Onset  . Breast cancer Mother   . Lung cancer Father     smoker  . Diabetes Neg Hx   . Heart failure Mother    . Hypertension Neg Hx   . Colon cancer Neg Hx     Review of Systems: Per Dr. Biagio Quint  Physical Exam: @VITALS2 @ General: No acute distress.  Awake. Head:  Normocephalic.  Atraumatic. ENT:  EOMI.  Mucous membranes moist Remaining PE per Dr. Biagio Quint  Studies:  No results found for this basename: HGB, WBC, PLT,  in the last 72 hours  No results found for this basename: NA, K, CL, CO2, BUN, CREATININE, CALCIUM, MAGNESIUM, GFRNONAA, GFRAA,  in the last 72 hours   No results found for this basename: PT, INR, APTT,  in the last 72 hours   No components found with this basename: ABG,     Assessment:  Sigmoid diverticular disease  Plan: Cystoscopy, placement of left ureteral catheter. I have discussed procedure as well as risks/complications with him.    Pager:[phone]

## 2013-02-11 NOTE — Transfer of Care (Signed)
Immediate Anesthesia Transfer of Care Note  Patient: [name]  Procedure(s) Performed: Procedure(s) with comments: LAPAROSCOPIC SIGMOID COLECTOMY WITH MOBILIZATION SPLENIC FLEXURE (N/A) CYSTOSCOPY WITH STENT PLACEMENT (Left) - Cystoscpy with Left Ureteral Stent Placement  Patient Location: PACU  Anesthesia Type:General  Level of Consciousness: awake, alert , oriented and patient cooperative  Airway & Oxygen Therapy: Patient Spontanous Breathing and Patient connected to face mask oxygen  Post-op Assessment: Report given to PACU RN, Post -op Vital signs reviewed and stable and Patient moving all extremities  Post vital signs: Reviewed and stable  Complications: No apparent anesthesia complications

## 2013-02-11 NOTE — Brief Op Note (Signed)
02/11/2013  12:27 PM  PATIENT:  [name]  57 y.o. male  PRE-OPERATIVE DIAGNOSIS:  diverticulitis   POST-OPERATIVE DIAGNOSIS:  diverticulitis   PROCEDURE:  Procedure(s) with comments: LAPAROSCOPIC SIGMOID COLECTOMY WITH MOBILIZATION SPLENIC FLEXURE (N/A) CYSTOSCOPY WITH STENT PLACEMENT (Left) - Cystoscpy with Left Ureteral Stent Placement  SURGEON:  Surgeon(s) and Role: Panel 1:    * Lodema Pilot, DO - Primary    * Romie Levee, MD - Assisting  Panel 2:    * Marcine Matar, MD - Primary  PHYSICIAN ASSISTANT:   ASSISTANTS: Thomas   ANESTHESIA:   general  EBL:  Total I/O In: 3800 [I.V.:3800] Out: 340 [Urine:90; Blood:250]  BLOOD ADMINISTERED:none  DRAINS: none   LOCAL MEDICATIONS USED:  MARCAINE    and LIDOCAINE   SPECIMEN:  Source of Specimen:  sigmoid colon  DISPOSITION OF SPECIMEN:  PATHOLOGY  COUNTS:  YES  TOURNIQUET:  * No tourniquets in log *  DICTATION: .Other Dictation: Dictation Number [id number]  PLAN OF CARE: Admit to inpatient   PATIENT DISPOSITION:  PACU - hemodynamically stable.   Delay start of Pharmacological VTE agent (>24hrs) due to surgical blood loss or risk of bleeding: no

## 2013-02-11 NOTE — Progress Notes (Signed)
In appropriate postop pain but seems to be doing fine otherwise.  Vitals stable.

## 2013-02-11 NOTE — Anesthesia Preprocedure Evaluation (Addendum)
Anesthesia Evaluation  Patient identified by MRN, date of birth, ID band Patient awake    Reviewed: Allergy & Precautions, H&P , NPO status , Patient's Chart, lab work & pertinent test results  Airway Mallampati: II TM Distance: >3 FB Neck ROM: Full    Dental  (+) Teeth Intact and Dental Advisory Given   Pulmonary neg pulmonary ROS,  breath sounds clear to auscultation        Cardiovascular negative cardio ROS  + Valvular Problems/Murmurs Rhythm:Regular Rate:Normal     Neuro/Psych PSYCHIATRIC DISORDERS negative neurological ROS     GI/Hepatic negative GI ROS, Neg liver ROS,   Endo/Other  negative endocrine ROS  Renal/GU negative Renal ROS     Musculoskeletal negative musculoskeletal ROS (+)   Abdominal   Peds  Hematology negative hematology ROS (+)   Anesthesia Other Findings   Reproductive/Obstetrics                          Anesthesia Physical Anesthesia Plan  ASA: II  Anesthesia Plan: General   Post-op Pain Management:    Induction: Intravenous  Airway Management Planned: Oral ETT  Additional Equipment:   Intra-op Plan:   Post-operative Plan: Extubation in OR  Informed Consent: I have reviewed the patients History and Physical, chart, labs and discussed the procedure including the risks, benefits and alternatives for the proposed anesthesia with the patient or authorized representative who has indicated his/her understanding and acceptance.   Dental advisory given  Plan Discussed with: CRNA  Anesthesia Plan Comments:         Anesthesia Quick Evaluation

## 2013-02-11 NOTE — Anesthesia Postprocedure Evaluation (Signed)
Anesthesia Post Note  Patient: [name]  Procedure(s) Performed: Procedure(s) (LRB): LAPAROSCOPIC SIGMOID COLECTOMY WITH MOBILIZATION SPLENIC FLEXURE (N/A) CYSTOSCOPY WITH STENT PLACEMENT (Left)  Anesthesia type: General  Patient location: PACU  Post pain: Pain level controlled  Post assessment: Post-op Vital signs reviewed  Last Vitals: BP 144/92  Pulse 89  Temp(Src) 36.4 C (Oral)  Resp 12  SpO2 100%  Post vital signs: Reviewed  Level of consciousness: sedated  Complications: No apparent anesthesia complications

## 2013-02-11 NOTE — Op Note (Signed)
Preoperative diagnosis: Sigmoid diverticulitis  Postoperative diagnosis: Same   Procedure: Cystoscopy, placement of 6 French left ureteral catheter    Surgeon: Bertram Millard. Dmiya Malphrus, M.D.   Anesthesia: Gen.   Complications: None  Specimen(s): None  Drain(s): 18 French Foley, 6 French ureteral catheter in left ureter  Indications: 57 year-old male with symptomatic, recurrent sigmoid diverticulitis. He is scheduled for sigmoid colectomy by Dr. Biagio Quint today. Dr. Biagio Quint requested ureteral catheter to assist with identification of left ureter. I have met the patient, discussed the procedure with him.   Technique and findings: The patient was identified, properly marked, and received preoperative IV antibiotics. He was taken to the operating room where general endotracheal anesthetic was administered. He was initially placed in the dorsolithotomy position, genitalia and perineum were prepped and draped. Proper timeout was then performed.  I advanced a 22 French panendoscope through his urethra. Urethra was normal, prostate was nonobstructive. The bladder was entered and inspected circumferentially. There were no tumors, foreign bodies or trabeculations. Ureteral orifices were normal in configuration and location. The bladder was inspected with both the 12 and 70 lenses. Following inspection, I negotiated, with the assistance of a sensor-tip guidewire, a 6 [place] open-ended catheter proximally in the left ureter. Passage was quite easily. Following adequate positioning of the catheter up the ureter approximately 25 cm, the guidewire was removed. A 16 French Foley catheter was placed within the bladder following removal of the cystoscope. The balloon was inflated with 10 cc of water, the ureteral catheter was fixed to the Foley catheter with a 2-0 silk tie. The ureteral catheter was drained with a Foley catheter using a Goldberg catheter adapter. At this point, the procedure was terminated. Dr. Biagio Quint  then commenced with this procedure.

## 2013-02-12 ENCOUNTER — Encounter (HOSPITAL_COMMUNITY): Payer: Self-pay | Admitting: General Surgery

## 2013-02-12 LAB — BASIC METABOLIC PANEL
Chloride: 99 mEq/L (ref 96–112)
GFR calc Af Amer: >90 mL/min (ref >90)
GFR calc non Af Amer: >90 mL/min (ref >90)
Potassium: 3.8 mEq/L (ref 3.5–5.1)
Sodium: 132 mEq/L — ABNORMAL LOW (ref 135–145)

## 2013-02-12 LAB — CBC
MCHC: 35 g/dL (ref 30.0–36.0)
Platelets: 192 10*3/uL (ref 150–400)
RDW: 12.7 % (ref 11.5–15.5)
WBC: 10.8 10*3/uL — ABNORMAL HIGH (ref 4.0–10.5)

## 2013-02-12 LAB — HEMOGLOBIN AND HEMATOCRIT, BLOOD: Hemoglobin: 12.8 g/dL — ABNORMAL LOW (ref 13.0–17.0)

## 2013-02-12 NOTE — Op Note (Signed)
[name], [name] NO.:  [id number]  MEDICAL RECORD NO.:  [id number]  LOCATION:  1534                         FACILITY:  [facility]  PHYSICIAN:  Lodema Pilot, MD       DATE OF BIRTH:  [birth year]  DATE OF PROCEDURE:  02/11/2013 DATE OF DISCHARGE:                              OPERATIVE REPORT   PROCEDURE:  Laparoscopic sigmoid colectomy with mobilization of the splenic flexure.  PREOPERATIVE DIAGNOSIS:  Diverticulitis.  POSTOPERATIVE DIAGNOSIS:  Diverticulitis.  SURGEON:  Lodema Pilot, MD  ASSISTANT:  Arnaldo Natal, NP  ANESTHESIA:  General endotracheal tube anesthesia with 43 mL of 1% lidocaine with epinephrine and 0.25% Marcaine in a 50:50 mixture.  FLUIDS:  3700 mL of crystalloid.  ESTIMATED BLOOD LOSS:  250 mL.  DRAINS:  None.  SPECIMENS:  Sigmoid colectomy sent to Pathology for permanent sectioning.  COMPLICATIONS:  None apparent.  FINDINGS:  Inflammatory changes of the sigmoid colon.  Colon fairly densely adhered to the pelvic sidewall and the area of the iliac vessels, complete mobilization of the splenic flexure with end-to-end anastomosis using 29-mm EEA stapler.  Leak test was negative.  INDICATION FOR PROCEDURE:  Mr. Linker is a 57 year old male who was previously admitted to the hospital with diverticulitis and localized perforation with abscess, was treated medically and clinically did well. Over the next several months, he has continued to have persistent left lower quadrant pain and never has completely resolved from this episode. In January, he had a repeat CT scan, which showed a 1 cm abscess.  At that time, we decided to proceed with sigmoid colectomy in the mean time and he has had several courses of antibiotics.  OPERATIVE DETAILS:  Mr. Legate was seen and evaluated in the preoperative area, and risks and benefits of procedure were discussed in lay terms. Informed consent was obtained.  He was given prophylactic antibiotics and  subcu heparin and taken to the operating room, placed on the table in supine position.  General endotracheal tube anesthesia was obtained and he was placed in lithotomy position and Dr. Retta Diones placed a left ureteral stent.  For operative details of this, please refer to his operative note.  He placed a Foley catheter as well, and then we re- prepped and draped the patient.  A procedure time-out was performed with all operative team members to confirm proper patient and procedure, and the supraumbilical midline incision was made in the skin and dissection carried down to the abdominal wall fascia using blunt dissection.  The fascia was elevated and sharply incised and the peritoneum entered bluntly.  A 12-mm balloon port was placed and pneumoperitoneum was obtained.  Laparoscope was introduced and there was no evidence of bleeding or bowel injury upon entry.  In right lower quadrant, a 12-mm port was placed under direct visualization.  A 5-mm suprapubic port was placed and the left lower quadrant port was placed under direct visualization.  The colon was already fairly stuck to the pelvic sidewall in the area of the sigmoid colon.  I retracted the small bowel and omentum to the upper abdomen and exposed the aorta and bifurcation of the vessels.  I identified the sigmoid vessels and divided the peritoneum over the vessels and the aorta and bluntly dissected the sigmoid vessels.  I also identified the ureter, which was easy to identify especially given placement of the ureteral stent.  After I had identified the ureter and the sigmoid vessels, I ligated the sigmoid vessels using EnSeal bipolar cautery and the placed the PDS over the stone and dissected posteriorly to the colon and through the Toldt fascia and then started to take down the lateral attachments.  Again, the sigmoid colon was pretty stuck against the area of the iliac vessels and this was taken down using some sharp dissection  and blunt dissection.  I carried this division of the Toldt fascia from distal to proximal around the splenic flexure.  The splenic flexure was completely brought down.  We had entered the lesser sac and again brought this splenic flexure all the way down and mobilized the transverse colon to the midportion.  Gallbladder was very good on the proximal colon.  At this time, we tried to divide some of the mesentery near the rectum, but no inflammatory change.  This was very thick and I felt that retraction would be easier if we did this through the extraction incision.  The suprapubic incision was extended for a few centimeters to accommodate placement of the GelPort wound retractor and the mesentery to the lower sigmoid colon was divided with LigaSure and larger vessels with 2-0 silk ties.  This skeletonized the rectum distal to where the tinea had disappeared and splayed out and the rectal stump was divided with the green contour stapler.  The specimen was completely removed and sent to Pathology for permanent sectioning.  We did not ever encounter any abscess cavity or purulence.  The rectum was soft and normal as well as the proximal bowel and descending colon.  We felt that it would be safe to perform anastomosis.  I exteriorized the proximal colon and removed the staple line and sutured in the 29-mm anvil using 2-0 Prolene pursestring suture and this was replaced back into the abdomen.  GelPort was placed and Dr. Maisie Fus passed the EEA stapler through the rectum to the rectal stump and we visualized this laparoscopically.  The spike was brought out through the rectal staple line and after ensuring that the proximal bowel was not twisted, we mated the anvil with the stapler and the stapler was tightened, then the anastomosis was created.  The donuts were inspected and noted to be full thickness with mucosa on both the proximal and distal donuts and leak test was performed by clamping  the proximal bowel and doing a proctoscopy, which was negative for any air leakage.  The air was suctioned and the remainder of the abdomen was suctioned.  The abdomen was inspected for hemostasis, which was noted to be adequate.  The wound was irrigated with sterile saline solution.  The remainder of the colon was inspected and there was no evidence of other bowel injury during mobilization and the omentum was brought back down into the pelvis that did not appear to be any tension on the anastomosis.  The fascia was approximated with #1 PDS suture x2 in open fashion and the umbilical fascia was approximated with interrupted 0 Vicryl sutures in open fashion.  The abdomen was reinsufflated with carbon dioxide gas and there was no evidence of bleeding or bowel injury and no evidence of injury upon closure.  The wounds were irrigated with sterile saline solution, injected with  43 mL of 1% lidocaine with epinephrine and 0.25% Marcaine in a 50:50 mixture.  The skin edges were approximated with 4-0 Monocryl subcuticular suture.  The skin was washed and dried and benzoin and Steri-Strips were applied.  Sterile dressing was applied.  Completion x-ray was performed to evaluate for any possible retained foreign body because the initial instrument count was incorrect, although this was later, the missing Kelly clamp was identified.  We completed the x-ray anyway and this confirmed that there was no evidence of any retained foreign body.  The ureteral stent was removed at the end of the case, and the patient tolerated the procedure well without apparent complications.          ______________________________ Lodema Pilot, MD     BL/MEDQ  D:  02/11/2013  T:  02/12/2013  Job:  [id number]

## 2013-02-12 NOTE — Progress Notes (Signed)
1 Day Post-Op  Subjective: No nausea this morning.  "sore"  Objective: Vital signs in last 24 hours: Temp:  [97 F (36.1 C)-98.1 F (36.7 C)] 97.4 F (36.3 C) (03/28 0629) Pulse Rate:  [79-98] 83 (03/28 0629) Resp:  [7-19] 18 (03/28 0629) BP: (124-164)/(77-100) 124/77 mmHg (03/28 0629) SpO2:  [96 %-100 %] 96 % (03/28 0629) Weight:  [175 lb (79.379 kg)] 175 lb (79.379 kg) (03/27 1700) Last BM Date: 02/10/13  Intake/Output from previous day: 03/27 0701 - 03/28 0700 In: 6289.6 [I.V.:6239.6; IV Piggyback:50] Out: 1890 [Urine:1640; Blood:250] Intake/Output this shift:    General appearance: alert, cooperative and no distress Resp: clear to auscultation bilaterally Cardio: normal rate, regular GI: soft, appropriate tenderness in lower abdomen, nontender in upper abdomen, ND, wounds dressed but dressings clean Extremities: SCD's urine clearing but still with a little blood  Lab Results:   Recent Labs  02/12/13 0506  WBC 10.8*  HGB 12.8*  HCT 36.6*  PLT 192   BMET  Recent Labs  02/12/13 0506  NA 132*  K 3.8  CL 99  CO2 26  GLUCOSE 137*  BUN 8  CREATININE 0.78  CALCIUM 8.1*   PT/INR No results found for this basename: LABPROT, INR,  in the last 72 hours ABG No results found for this basename: PHART, PCO2, PO2, HCO3,  in the last 72 hours  Studies/Results: Dg Abd Portable 1v  02/11/2013  *RADIOLOGY REPORT*  Clinical Data: Status post sigmoidectomy for diverticulitis. Status post cystoscopy and left ureteral catheter placement.  PORTABLE ABDOMEN - 1 VIEW  Comparison: CT abdomen and pelvis 12/08/2012.  Findings: No retained surgical instruments identified. Suture material is seen in the pelvis.  Left ureteral catheter is noted. Screws fixing a left hip fracture are also seen.  IMPRESSION: Negative for retained surgical instrument.   Original Report Authenticated By: Holley Dexter, M.D.     Anti-infectives: Anti-infectives   Start     Dose/Rate Route Frequency  Ordered Stop   02/12/13 0600  ertapenem (INVANZ) 1 g in sodium chloride 0.9 % 50 mL IVPB     1 g 100 mL/hr over 30 Minutes Intravenous Every 24 hours 02/11/13 1344 02/12/13 0630   02/11/13 0532  ertapenem (INVANZ) 1 g in sodium chloride 0.9 % 50 mL IVPB     1 g 100 mL/hr over 30 Minutes Intravenous On call to O.R. 02/11/13 0532 02/11/13 0735      Assessment/Plan: s/p Procedure(s) with comments: LAPAROSCOPIC SIGMOID COLECTOMY WITH MOBILIZATION SPLENIC FLEXURE (N/A) CYSTOSCOPY WITH STENT PLACEMENT (Left) - Cystoscpy with Left Ureteral Stent Placement He looks like he is doing okay but his HGB dropped from preop.  His HR is normal, abdomen ND and appropriate, uop okay.  I do not see any signs of bleeding but will hold lovenox this am and repeat HGB.  Will also keep foley for a little bit longer due to the hematuria and to follow uop since it had been low during the case and to monitor HD status.  LOS: 1 day    Lodema Pilot DAVID 02/12/2013

## 2013-02-12 NOTE — Progress Notes (Signed)
Patient ID: [name], male   DOB: 1956/11/06, 57 y.o.   MRN: [id number] He continues to feel well.  Repeat HGB was stable.  HR 105 but he appears very comfortable and UOP is clearing and has good output.  We will go ahead and remove his foley and repeat HGB in the morning.  Lovenox given.

## 2013-02-12 NOTE — Care Management Note (Signed)
    Page 1 of 1   02/12/2013     10:09:37 AM   CARE MANAGEMENT NOTE 02/12/2013  Patient:  [name], [name]   Account Number:  [id number]  Date Initiated:  02/12/2013  Documentation initiated by:  Lorenda Ishihara  Subjective/Objective Assessment:   57 yo male admitted s/p lap colectomy and cystoscopy with stent. PTA lived at home with spouse.     Action/Plan:   Home when stable   Anticipated DC Date:  02/15/2013   Anticipated DC Plan:  HOME/SELF CARE      DC Planning Services  CM consult      Choice offered to / List presented to:             Status of service:  Completed, signed off Medicare Important Message given?   (If response is "NO", the following Medicare IM given date fields will be blank) Date Medicare IM given:   Date Additional Medicare IM given:    Discharge Disposition:  HOME/SELF CARE  Per UR Regulation:  Reviewed for med. necessity/level of care/duration of stay  If discussed at Long Length of Stay Meetings, dates discussed:    Comments:

## 2013-02-12 NOTE — Progress Notes (Signed)
INITIAL NUTRITION ASSESSMENT  DOCUMENTATION CODES Per approved criteria  -Not Applicable   INTERVENTION: - Diet advancement per MD - Discussed low fiber diet that pt will need to follow at discharge r/t recent colon surgery - pt expressed understanding - Will continue to monitor   NUTRITION DIAGNOSIS: Inadequate oral intake related to inability to eat as evidenced by NPO.   Goal: Advance diet as tolerated to low fiber diet  Monitor:  Weights, labs, diet advancement, BM  Reason for Assessment: Nutrition risk   57 y.o. male  Admitting Dx: Diverticulitis   ASSESSMENT: Pt reports eating well at home, 3 meals/day with good appetite. Pt was on clear liquid diet 3/25-3/26 in preparation for surgery. Pt reports 10 pound unintended weight loss since October 2013 r/t not eating for a week in preparation for surgery. POD # 1 sigmoid colectomy.   Height: Ht Readings from Last 1 Encounters:  02/11/13 6\' 1"  (1.854 m)    Weight: Wt Readings from Last 1 Encounters:  02/11/13 175 lb (79.379 kg)    Ideal Body Weight: 184 lb   % Ideal Body Weight: 95  Wt Readings from Last 10 Encounters:  02/11/13 175 lb (79.379 kg)  02/11/13 175 lb (79.379 kg)  01/29/13 181 lb (82.101 kg)  12/24/12 180 lb 6.4 oz (81.829 kg)  12/22/12 183 lb 9.6 oz (83.28 kg)  12/04/12 183 lb (83.008 kg)  11/05/12 182 lb (82.555 kg)  10/06/12 179 lb 12.8 oz (81.557 kg)  09/11/12 188 lb (85.276 kg)  09/11/12 188 lb (85.276 kg)    Usual Body Weight: 185 lb  % Usual Body Weight: 95  BMI:  Body mass index is 23.09 kg/(m^2).  Estimated Nutritional Needs: Kcal: 1610-9604 Protein: 95-110g Fluid: 1.9-2.3L/day  Skin: Abdominal incision   Diet Order: NPO  EDUCATION NEEDS: -No education needs identified at this time   Intake/Output Summary (Last 24 hours) at 02/12/13 1106 Last data filed at 02/12/13 1000  Gross per 24 hour  Intake 3802.08 ml  Output   1920 ml  Net 1882.08 ml    Last BM:  3/26  Labs:   Recent Labs Lab 02/12/13 0506  NA 132*  K 3.8  CL 99  CO2 26  BUN 8  CREATININE 0.78  CALCIUM 8.1*  GLUCOSE 137*    CBG (last 3)  No results found for this basename: GLUCAP,  in the last 72 hours  Scheduled Meds: . enoxaparin (LOVENOX) injection  40 mg Subcutaneous Q24H  . morphine   Intravenous Q4H    Continuous Infusions: . dextrose 5 % and 0.45 % NaCl with KCl 20 mEq/L 125 mL/hr at 02/12/13 5409    Past Medical History  Diagnosis Date  . ADD (attention deficit disorder)     adult- per psych  . Insomnia     per psych  . Heart murmur     Echo 2003 "thickened and somehow myomateous MV, mild MR"  . Fall 4-10    fx left hip and wrist  . Diverticulitis     Perforated sigmoid diverticulitis   . Adenomatous colon polyp     Past Surgical History  Procedure Laterality Date  . Nose biopsy  2011    Dr Terri Piedra, no cancer  . Hip surgery      left  . Wrist surgery      left     Levon Hedger MS, RD, LDN [phone] Pager [phone] After Hours Pager

## 2013-02-13 DIAGNOSIS — F988 Other specified behavioral and emotional disorders with onset usually occurring in childhood and adolescence: Secondary | ICD-10-CM | POA: Diagnosis present

## 2013-02-13 DIAGNOSIS — G4700 Insomnia, unspecified: Secondary | ICD-10-CM | POA: Diagnosis present

## 2013-02-13 DIAGNOSIS — K5792 Diverticulitis of intestine, part unspecified, without perforation or abscess without bleeding: Secondary | ICD-10-CM | POA: Diagnosis present

## 2013-02-13 LAB — CBC WITH DIFFERENTIAL/PLATELET
Basophils Relative: 0 % (ref 0–1)
Eosinophils Absolute: 0.1 10*3/uL (ref 0.0–0.7)
Hemoglobin: 10.7 g/dL — ABNORMAL LOW (ref 13.0–17.0)
Lymphs Abs: 1.5 10*3/uL (ref 0.7–4.0)
MCH: 30.9 pg (ref 26.0–34.0)
MCHC: 33.8 g/dL (ref 30.0–36.0)
Monocytes Relative: 11 % (ref 3–12)
Neutro Abs: 5.3 10*3/uL (ref 1.7–7.7)
Neutrophils Relative %: 69 % (ref 43–77)
Platelets: 179 10*3/uL (ref 150–400)
RBC: 3.46 MIL/uL — ABNORMAL LOW (ref 4.22–5.81)

## 2013-02-13 LAB — BASIC METABOLIC PANEL
Calcium: 8.1 mg/dL — ABNORMAL LOW (ref 8.4–10.5)
GFR calc Af Amer: >90 mL/min (ref >90)
GFR calc non Af Amer: >90 mL/min (ref >90)
Glucose, Bld: 115 mg/dL — ABNORMAL HIGH (ref 70–99)
Potassium: 3.9 mEq/L (ref 3.5–5.1)
Sodium: 134 mEq/L — ABNORMAL LOW (ref 135–145)

## 2013-02-13 MED ORDER — LACTATED RINGERS IV BOLUS (SEPSIS): 1000.0000 mL | Freq: Three times a day (TID) | INTRAVENOUS | Status: AC | PRN

## 2013-02-13 MED ORDER — AMPHETAMINE-DEXTROAMPHETAMINE 30 MG PO TABS: 30.0000 mg | ORAL_TABLET | Freq: Every day | ORAL | Status: DC

## 2013-02-13 MED ORDER — ENOXAPARIN SODIUM 40 MG/0.4ML ~~LOC~~ SOLN
40.0000 mg | SUBCUTANEOUS | Status: DC
  Administered 2013-02-14 – 2013-02-15 (×2): 40 mg via SUBCUTANEOUS
  Filled 2013-02-13 (×4): qty 0.4

## 2013-02-13 MED ORDER — TRAZODONE HCL 50 MG PO TABS
50.0000 mg | ORAL_TABLET | Freq: Every day | ORAL | Status: DC
  Administered 2013-02-13 – 2013-02-15 (×3): 50 mg via ORAL
  Filled 2013-02-13 (×4): qty 1

## 2013-02-13 MED ORDER — ACETAMINOPHEN 500 MG PO TABS
1000.0000 mg | ORAL_TABLET | Freq: Three times a day (TID) | ORAL | Status: DC
  Administered 2013-02-13 – 2013-02-15 (×9): 1000 mg via ORAL
  Filled 2013-02-13 (×13): qty 2

## 2013-02-13 MED ORDER — AMPHETAMINE-DEXTROAMPHETAMINE 10 MG PO TABS
30.0000 mg | ORAL_TABLET | Freq: Every day | ORAL | Status: DC
  Filled 2013-02-13 (×2): qty 3

## 2013-02-13 MED ORDER — LIP MEDEX EX OINT
1.0000 "application " | TOPICAL_OINTMENT | Freq: Two times a day (BID) | CUTANEOUS | Status: DC
  Administered 2013-02-13 – 2013-02-15 (×4): 1 via TOPICAL
  Filled 2013-02-13: qty 7

## 2013-02-13 MED ORDER — FUROSEMIDE 10 MG/ML IJ SOLN
40.0000 mg | Freq: Once | INTRAMUSCULAR | Status: AC
  Administered 2013-02-13: 40 mg via INTRAVENOUS
  Filled 2013-02-13: qty 4

## 2013-02-13 MED ORDER — MAGIC MOUTHWASH
15.0000 mL | Freq: Four times a day (QID) | ORAL | Status: DC | PRN
  Filled 2013-02-13: qty 15

## 2013-02-13 NOTE — Progress Notes (Signed)
[name] [id number] 1955/11/24  CARE TEAM:  PCP: Darryl Ora, MD  Outpatient Care Team: Patient Care Team: Darryl Plump, MD as PCP - General  Inpatient Treatment Team: Treatment Team: Attending Provider: Lodema Pilot, DO; Technician: [name], NT; Registered Nurse: [name]; Dietitian: [name], RD   Subjective:  Sore - PCA helps.  Sleepy w morphine but working for him Wife in room  Objective:  Vital signs:  Filed Vitals:   02/12/13 2130 02/12/13 2348 02/13/13 0400 02/13/13 0520  BP: 114/79   119/79  Pulse: 96   99  Temp: 98.5 F (36.9 C)   98 F (36.7 C)  TempSrc: Oral   Oral  Resp: 13 12 12 10   Height:      Weight:      SpO2: 99% 98% 97% 97%    Last BM Date: 02/10/13  Intake/Output   Yesterday:  03/28 0701 - 03/29 0700 In: 3012.5 [I.V.:3012.5] Out: 1850 [Urine:1850] This shift:  Total I/O In: -  Out: 350 [Urine:350]  Bowel function:  Flatus: y  BM: n  Drain: n/a  Physical Exam:  General: Pt mildly sleepy at 1st but them perks up to be awake/alert/oriented x4 in no acute distress Eyes: PERRL, normal EOM.  Sclera clear.  No icterus Neuro: CN II-XII intact w/o focal sensory/motor deficits. Lymph: No head/neck/groin lymphadenopathy Psych:  No delerium/psychosis/paranoia HENT: Normocephalic, Mucus membranes moist.  No thrush Neck: Supple, No tracheal deviation Chest: No chest wall pain w good excursion CV:  Pulses intact.  Regular rhythm MS: Normal AROM mjr joints.  No obvious deformity Abdomen: Soft.  Nondistended.  Dressings clean.  Mildly tender at incisions only.  No incarcerated hernias. Ext:  SCDs BLE.  No mjr edema.  No cyanosis Skin: No petechiae / purpurae]  Results:   Labs: Results for orders placed during the hospital encounter of 02/11/13 (from the past 48 hour(s))  CBC     Status: Abnormal   Collection Time    02/12/13  5:06 AM      Result Value Range   WBC 10.8 (*) 4.0 - 10.5 K/uL   RBC 4.11 (*) 4.22 - 5.81  MIL/uL   Hemoglobin 12.8 (*) 13.0 - 17.0 g/dL   HCT 96.2 (*) 95.2 - 84.1 %   MCV 89.1  78.0 - 100.0 fL   MCH 31.1  26.0 - 34.0 pg   MCHC 35.0  30.0 - 36.0 g/dL   RDW 32.4  40.1 - 02.7 %   Platelets 192  150 - 400 K/uL  BASIC METABOLIC PANEL     Status: Abnormal   Collection Time    02/12/13  5:06 AM      Result Value Range   Sodium 132 (*) 135 - 145 mEq/L   Potassium 3.8  3.5 - 5.1 mEq/L   Chloride 99  96 - 112 mEq/L   CO2 26  19 - 32 mEq/L   Glucose, Bld 137 (*) 70 - 99 mg/dL   BUN 8  6 - 23 mg/dL   Creatinine, Ser 2.53  0.50 - 1.35 mg/dL   Calcium 8.1 (*) 8.4 - 10.5 mg/dL   GFR calc non Af Amer >90  >90 mL/min   GFR calc Af Amer >90  >90 mL/min   Comment:            The eGFR has been calculated     using the CKD EPI equation.     This calculation has not been  validated in all clinical     situations.     eGFR's persistently     <90 mL/min signify     possible Chronic Kidney Disease.  HEMOGLOBIN AND HEMATOCRIT, BLOOD     Status: Abnormal   Collection Time    02/12/13  8:20 AM      Result Value Range   Hemoglobin 12.8 (*) 13.0 - 17.0 g/dL   HCT 14.7 (*) 82.9 - 56.2 %  BASIC METABOLIC PANEL     Status: Abnormal   Collection Time    02/13/13  4:40 AM      Result Value Range   Sodium 134 (*) 135 - 145 mEq/L   Potassium 3.9  3.5 - 5.1 mEq/L   Chloride 101  96 - 112 mEq/L   CO2 30  19 - 32 mEq/L   Glucose, Bld 115 (*) 70 - 99 mg/dL   BUN 5 (*) 6 - 23 mg/dL   Creatinine, Ser 1.30  0.50 - 1.35 mg/dL   Calcium 8.1 (*) 8.4 - 10.5 mg/dL   GFR calc non Af Amer >90  >90 mL/min   GFR calc Af Amer >90  >90 mL/min   Comment:            The eGFR has been calculated     using the CKD EPI equation.     This calculation has not been     validated in all clinical     situations.     eGFR's persistently     <90 mL/min signify     possible Chronic Kidney Disease.  CBC WITH DIFFERENTIAL     Status: Abnormal   Collection Time    02/13/13  4:40 AM      Result Value Range    WBC 7.7  4.0 - 10.5 K/uL   RBC 3.46 (*) 4.22 - 5.81 MIL/uL   Hemoglobin 10.7 (*) 13.0 - 17.0 g/dL   HCT 86.5 (*) 78.4 - 69.6 %   MCV 91.6  78.0 - 100.0 fL   MCH 30.9  26.0 - 34.0 pg   MCHC 33.8  30.0 - 36.0 g/dL   RDW 29.5  28.4 - 13.2 %   Platelets 179  150 - 400 K/uL   Neutrophils Relative 69  43 - 77 %   Neutro Abs 5.3  1.7 - 7.7 K/uL   Lymphocytes Relative 19  12 - 46 %   Lymphs Abs 1.5  0.7 - 4.0 K/uL   Monocytes Relative 11  3 - 12 %   Monocytes Absolute 0.8  0.1 - 1.0 K/uL   Eosinophils Relative 1  0 - 5 %   Eosinophils Absolute 0.1  0.0 - 0.7 K/uL   Basophils Relative 0  0 - 1 %   Basophils Absolute 0.0  0.0 - 0.1 K/uL    Imaging / Studies: Dg Abd Portable 1v  02/11/2013  *RADIOLOGY REPORT*  Clinical Data: Status post sigmoidectomy for diverticulitis. Status post cystoscopy and left ureteral catheter placement.  PORTABLE ABDOMEN - 1 VIEW  Comparison: CT abdomen and pelvis 12/08/2012.  Findings: No retained surgical instruments identified. Suture material is seen in the pelvis.  Left ureteral catheter is noted. Screws fixing a left hip fracture are also seen.  IMPRESSION: Negative for retained surgical instrument.   Original Report Authenticated By: [name], M.D.     Medications / Allergies: per chart  Antibiotics: Anti-infectives   Start     Dose/Rate Route Frequency Ordered Stop   02/12/13  0600  ertapenem (INVANZ) 1 g in sodium chloride 0.9 % 50 mL IVPB     1 g 100 mL/hr over 30 Minutes Intravenous Every 24 hours 02/11/13 1344 02/12/13 0630   02/11/13 0532  ertapenem (INVANZ) 1 g in sodium chloride 0.9 % 50 mL IVPB     1 g 100 mL/hr over 30 Minutes Intravenous On call to O.R. 02/11/13 0532 02/11/13 0735      Problem List:   Principal Problem:   Diverticulitis s/p lap colectomy [id number] Active Problems:   ADD (attention deficit disorder)   Insomnia   Assessment  [name]  57 y.o. male  2 Days Post-Op  Procedure(s): LAPAROSCOPIC SIGMOID COLECTOMY  WITH MOBILIZATION SPLENIC FLEXURE CYSTOSCOPY WITH STENT PLACEMENT  Stable  Plan:  -try liquids -wean IVFs & try lasix w +5.5L since OR -follow Hgb - hold lovenox if drops more -ADHD - restart addarall & follow -VTE prophylaxis- SCDs, etc -mobilize as tolerated to help recovery -f/u path -keep PCA for now - prob wean off tomorrow.  Add PO tylenol for some pain control also  Ardeth Sportsman, M.D., F.A.C.S. Gastrointestinal and Minimally Invasive Surgery [facility] [address], [address] [place], [place] [address] [phone] Main / Paging [phone] Voice Mail   02/13/2013

## 2013-02-14 LAB — POTASSIUM: Potassium: 3.5 mEq/L (ref 3.5–5.1)

## 2013-02-14 LAB — HEMOGLOBIN: Hemoglobin: 10.2 g/dL — ABNORMAL LOW (ref 13.0–17.0)

## 2013-02-14 MED ORDER — MORPHINE SULFATE 2 MG/ML IJ SOLN: 2.0000 mg | INTRAMUSCULAR | Status: DC | PRN

## 2013-02-14 MED ORDER — METOPROLOL TARTRATE 12.5 MG HALF TABLET
12.5000 mg | ORAL_TABLET | Freq: Two times a day (BID) | ORAL | Status: DC | PRN
  Filled 2013-02-14: qty 1

## 2013-02-14 MED ORDER — OXYCODONE HCL 5 MG PO TABS: 5.0000 mg | ORAL_TABLET | ORAL | Status: DC | PRN

## 2013-02-14 MED ORDER — POTASSIUM CHLORIDE CRYS ER 20 MEQ PO TBCR
40.0000 meq | EXTENDED_RELEASE_TABLET | Freq: Every day | ORAL | Status: DC
  Administered 2013-02-14 – 2013-02-15 (×2): 40 meq via ORAL
  Filled 2013-02-14 (×3): qty 2

## 2013-02-14 NOTE — Progress Notes (Addendum)
[name] [id number] 11-Dec-1955  CARE TEAM:  PCP: Willow Ora, MD  Outpatient Care Team: Patient Care Team: Darryl Plump, MD as PCP - General  Inpatient Treatment Team: Treatment Team: Attending Provider: Lodema Pilot, DO; Registered Nurse: Horton Marshall; Dietitian: Lavena Bullion, RD; Technician: Zerita Boers Magbitang, NT   Subjective:  Less sore - not using PCA much Walking in hallways Does not like the IV pole Wife in room  Objective:  Vital signs:  Filed Vitals:   02/14/13 0400 02/14/13 0500 02/14/13 0754 02/14/13 0755  BP:  115/74    Pulse:  101    Temp:  98.3 F (36.8 C)    TempSrc:  Oral    Resp: 15 14 14 14   Height:      Weight:      SpO2: 97% 97% 98% 98%    Last BM Date: 02/08/13  Intake/Output   Yesterday:  03/29 0701 - 03/30 0700 In: 901.9 [I.V.:901.9] Out: 1750 [Urine:1750] This shift:     Bowel function:  Flatus: scant  BM: n  Drain: n/a  Physical Exam:  General: Pt awake/alert/oriented x4 in no acute distress Eyes: PERRL, normal EOM.  Sclera clear.  No icterus Neuro: CN II-XII intact w/o focal sensory/motor deficits. Lymph: No head/neck/groin lymphadenopathy Psych:  No delerium/psychosis/paranoia HENT: Normocephalic, Mucus membranes moist.  No thrush Neck: Supple, No tracheal deviation Chest: No chest wall pain w good excursion CV:  Pulses intact.  Regular rhythm MS: Normal AROM mjr joints.  No obvious deformity Abdomen: Soft.  Nondistended.  Dressings clean.  Mildly tender at incisions only.  No incarcerated hernias. Ext:  SCDs BLE.  No mjr edema.  No cyanosis Skin: No petechiae / purpurae]  Results:   Labs: Results for orders placed during the hospital encounter of 02/11/13 (from the past 48 hour(s))  BASIC METABOLIC PANEL     Status: Abnormal   Collection Time    02/13/13  4:40 AM      Result Value Range   Sodium 134 (*) 135 - 145 mEq/L   Potassium 3.9  3.5 - 5.1 mEq/L   Chloride 101  96 - 112 mEq/L   CO2 30  19 - 32 mEq/L    Glucose, Bld 115 (*) 70 - 99 mg/dL   BUN 5 (*) 6 - 23 mg/dL   Creatinine, Ser 4.09  0.50 - 1.35 mg/dL   Calcium 8.1 (*) 8.4 - 10.5 mg/dL   GFR calc non Af Amer >90  >90 mL/min   GFR calc Af Amer >90  >90 mL/min   Comment:            The eGFR has been calculated     using the CKD EPI equation.     This calculation has not been     validated in all clinical     situations.     eGFR's persistently     <90 mL/min signify     possible Chronic Kidney Disease.  CBC WITH DIFFERENTIAL     Status: Abnormal   Collection Time    02/13/13  4:40 AM      Result Value Range   WBC 7.7  4.0 - 10.5 K/uL   RBC 3.46 (*) 4.22 - 5.81 MIL/uL   Hemoglobin 10.7 (*) 13.0 - 17.0 g/dL   HCT 81.1 (*) 91.4 - 78.2 %   MCV 91.6  78.0 - 100.0 fL   MCH 30.9  26.0 - 34.0 pg   MCHC 33.8  30.0 -  36.0 g/dL   RDW 96.0  45.4 - 09.8 %   Platelets 179  150 - 400 K/uL   Neutrophils Relative 69  43 - 77 %   Neutro Abs 5.3  1.7 - 7.7 K/uL   Lymphocytes Relative 19  12 - 46 %   Lymphs Abs 1.5  0.7 - 4.0 K/uL   Monocytes Relative 11  3 - 12 %   Monocytes Absolute 0.8  0.1 - 1.0 K/uL   Eosinophils Relative 1  0 - 5 %   Eosinophils Absolute 0.1  0.0 - 0.7 K/uL   Basophils Relative 0  0 - 1 %   Basophils Absolute 0.0  0.0 - 0.1 K/uL  HEMOGLOBIN     Status: Abnormal   Collection Time    02/14/13  4:27 AM      Result Value Range   Hemoglobin 10.2 (*) 13.0 - 17.0 g/dL  POTASSIUM     Status: None   Collection Time    02/14/13  4:27 AM      Result Value Range   Potassium 3.5  3.5 - 5.1 mEq/L  CREATININE, SERUM     Status: None   Collection Time    02/14/13  4:27 AM      Result Value Range   Creatinine, Ser 0.83  0.50 - 1.35 mg/dL   GFR calc non Af Amer >90  >90 mL/min   GFR calc Af Amer >90  >90 mL/min   Comment:            The eGFR has been calculated     using the CKD EPI equation.     This calculation has not been     validated in all clinical     situations.     eGFR's persistently     <90 mL/min  signify     possible Chronic Kidney Disease.    Imaging / Studies: No results found.  Medications / Allergies: per chart  Antibiotics: Anti-infectives   Start     Dose/Rate Route Frequency Ordered Stop   02/12/13 0600  ertapenem (INVANZ) 1 g in sodium chloride 0.9 % 50 mL IVPB     1 g 100 mL/hr over 30 Minutes Intravenous Every 24 hours 02/11/13 1344 02/12/13 0630   02/11/13 0532  ertapenem (INVANZ) 1 g in sodium chloride 0.9 % 50 mL IVPB     1 g 100 mL/hr over 30 Minutes Intravenous On call to O.R. 02/11/13 0532 02/11/13 0735      Problem List:   Principal Problem:   Diverticulitis s/p lap colectomy [id number] Active Problems:   ADD (attention deficit disorder)   Insomnia   Assessment  [name]  57 y.o. male  3 Days Post-Op  Procedure(s): LAPAROSCOPIC SIGMOID COLECTOMY WITH MOBILIZATION SPLENIC FLEXURE CYSTOSCOPY WITH STENT PLACEMENT  Stable  Plan:  -adv diet -wean IVF. - PRN IVF bolus if dehydrated -follow Hgb stable so far - hold lovenox if drops more -ADHD - restart addarall & follow -B blocker PRN -VTE prophylaxis- SCDs, etc -mobilize as tolerated to help recovery -f/u path -wean off PCA   [name], M.D., F.A.C.S. Gastrointestinal and Minimally Invasive Surgery [facility] [address] [place], [place] [address] [phone] Main / Paging [phone] Voice Mail   02/14/2013

## 2013-02-15 LAB — CBC
HCT: 31 % — ABNORMAL LOW (ref 39.0–52.0)
Hemoglobin: 10.8 g/dL — ABNORMAL LOW (ref 13.0–17.0)
RDW: 12.5 % (ref 11.5–15.5)
WBC: 6 10*3/uL (ref 4.0–10.5)

## 2013-02-15 LAB — CREATININE, SERUM
GFR calc Af Amer: >90 mL/min (ref >90)
GFR calc non Af Amer: >90 mL/min (ref >90)

## 2013-02-15 NOTE — Progress Notes (Signed)
4 Days Post-Op  Subjective: Small BM this morning.  Tolerating regular diet. +flatus  Objective: Vital signs in last 24 hours: Temp:  [97.5 F (36.4 C)-98.6 F (37 C)] 98.1 F (36.7 C) (03/31 1406) Pulse Rate:  [81-108] 108 (03/31 1406) Resp:  [14-20] 20 (03/31 1406) BP: (111-125)/(64-81) 112/77 mmHg (03/31 1406) SpO2:  [97 %-98 %] 98 % (03/31 1406) Last BM Date: 02/15/13  Intake/Output from previous day: 03/30 0701 - 03/31 0700 In: 360 [P.O.:360] Out: -  Intake/Output this shift: Total I/O In: 240 [P.O.:240] Out: -   General appearance: alert, cooperative and no distress Resp: clear to auscultation bilaterally Cardio: normal rate, regular GI: soft, nontender, ND, wounds okay and dressings removed today.  no signs of infection,   Lab Results:   Recent Labs  02/13/13 0440 02/14/13 0427 02/15/13 0400  WBC 7.7  --  6.0  HGB 10.7* 10.2* 10.8*  HCT 31.7*  --  31.0*  PLT 179  --  238   BMET  Recent Labs  02/13/13 0440 02/14/13 0427 02/15/13 0400  NA 134*  --   --   K 3.9 3.5 4.1  CL 101  --   --   CO2 30  --   --   GLUCOSE 115*  --   --   BUN 5*  --   --   CREATININE 0.92 0.83 0.87  CALCIUM 8.1*  --   --    PT/INR No results found for this basename: LABPROT, INR,  in the last 72 hours ABG No results found for this basename: PHART, PCO2, PO2, HCO3,  in the last 72 hours  Studies/Results: No results found.  Anti-infectives: Anti-infectives   Start     Dose/Rate Route Frequency Ordered Stop   02/12/13 0600  ertapenem (INVANZ) 1 g in sodium chloride 0.9 % 50 mL IVPB     1 g 100 mL/hr over 30 Minutes Intravenous Every 24 hours 02/11/13 1344 02/12/13 0630   02/11/13 0532  ertapenem (INVANZ) 1 g in sodium chloride 0.9 % 50 mL IVPB     1 g 100 mL/hr over 30 Minutes Intravenous On call to O.R. 02/11/13 0532 02/11/13 0735      Assessment/Plan: s/p Procedure(s) with comments: LAPAROSCOPIC SIGMOID COLECTOMY WITH MOBILIZATION SPLENIC FLEXURE  (N/A) CYSTOSCOPY WITH STENT PLACEMENT (Left) - Cystoscpy with Left Ureteral Stent Placement he continues to do well.  on regular diet.  anticipate discharge to home tomorrow if he continues to do well.  LOS: 4 days    Lodema Pilot DAVID 02/15/2013

## 2013-02-16 MED ORDER — OXYCODONE HCL 5 MG PO TABS: 5.0000 mg | ORAL_TABLET | ORAL | Status: DC | PRN

## 2013-02-16 MED ORDER — DOCUSATE SODIUM 100 MG PO CAPS: 100.0000 mg | ORAL_CAPSULE | Freq: Two times a day (BID) | ORAL | Status: DC

## 2013-02-16 NOTE — Discharge Summary (Signed)
  Physician Discharge Summary  Patient ID: [name] MRN: [id number] DOB/AGE: 20-Apr-1956 57 y.o.  Admit date: 02/11/2013 Discharge date: 02/16/2013  Admission Diagnoses: diverticulitis  Discharge Diagnoses:  Principal Problem:   Diverticulitis s/p lap colectomy [id number] Active Problems:   ADD (attention deficit disorder)   Insomnia   Discharged Condition: stable  Hospital Course: to OR 02/11/13 for lap sigmoidectomy.  No complications.  Diet advanced on POD 1 and gradually increased over next 3 days.  He tolerated this well.  He was passing flatus and moving bowels.  Pain well controlled and ready for discharge to home on POD 5.  Consults: None  Significant Diagnostic Studies: none  Treatments: surgery: 02/11/13 lap sigmoidectomy  Disposition: 01-Home or Self Care  Discharge Orders   Future Orders Complete By Expires     Call MD for:  persistant nausea and vomiting  As directed     Call MD for:  redness, tenderness, or signs of infection (pain, swelling, redness, odor or green/yellow discharge around incision site)  As directed     Call MD for:  severe uncontrolled pain  As directed     Call MD for:  temperature >100.4  As directed     Diet - low sodium heart healthy  As directed     Discharge instructions  As directed     Comments:      Call [phone] for follow up appointment in 2-3 weeks May shower.    Increase activity slowly  As directed         Medication List    TAKE these medications       amphetamine-dextroamphetamine 30 MG tablet  Commonly known as:  ADDERALL  Take 30 mg by mouth daily before breakfast.     CENTRUM ULTRA MENS Tabs  Take by mouth. Three times a week--Monday, Wednesday, Friday     docusate sodium 100 MG capsule  Commonly known as:  COLACE  Take 1 capsule (100 mg total) by mouth 2 (two) times daily.     Fish Oil 1200 MG Caps  Take by mouth. Three times a week--Monday, Wednesday, Friday.     oxyCODONE 5 MG immediate release tablet   Commonly known as:  Oxy IR/ROXICODONE  Take 1-2 tablets (5-10 mg total) by mouth every 4 (four) hours as needed.     traZODone 100 MG tablet  Commonly known as:  DESYREL  Take 50 mg by mouth at bedtime.         SignedLodema Pilot DAVID 02/16/2013, 5:15 AM

## 2013-02-16 NOTE — Progress Notes (Signed)
5 Days Post-Op  Subjective: Continues to feel well. No pain and tolerating diet.  Objective: Vital signs in last 24 hours: Temp:  [98.1 F (36.7 C)] 98.1 F (36.7 C) (03/31 2128) Pulse Rate:  [89-108] 89 (03/31 2128) Resp:  [16-20] 16 (03/31 2128) BP: (112-120)/(77-80) 120/80 mmHg (03/31 2128) SpO2:  [97 %-98 %] 97 % (03/31 2128) Last BM Date: 02/15/13  Intake/Output from previous day: 03/31 0701 - 04/01 0700 In: 360 [P.O.:360] Out: -  Intake/Output this shift:    General appearance: alert, cooperative and no distress Resp: clear to auscultation bilaterally Cardio: regular rate and rhythm, S1, S2 normal, no murmur, click, rub or gallop GI: soft, NT, ND, wounds without infection  Lab Results:   Recent Labs  02/14/13 0427 02/15/13 0400  WBC  --  6.0  HGB 10.2* 10.8*  HCT  --  31.0*  PLT  --  238   BMET  Recent Labs  02/14/13 0427 02/15/13 0400  K 3.5 4.1  CREATININE 0.83 0.87   PT/INR No results found for this basename: LABPROT, INR,  in the last 72 hours ABG No results found for this basename: PHART, PCO2, PO2, HCO3,  in the last 72 hours  Studies/Results: No results found.  Anti-infectives: Anti-infectives   Start     Dose/Rate Route Frequency Ordered Stop   02/12/13 0600  ertapenem (INVANZ) 1 g in sodium chloride 0.9 % 50 mL IVPB     1 g 100 mL/hr over 30 Minutes Intravenous Every 24 hours 02/11/13 1344 02/12/13 0630   02/11/13 0532  ertapenem (INVANZ) 1 g in sodium chloride 0.9 % 50 mL IVPB     1 g 100 mL/hr over 30 Minutes Intravenous On call to O.R. 02/11/13 0532 02/11/13 0735      Assessment/Plan: s/p Procedure(s) with comments: LAPAROSCOPIC SIGMOID COLECTOMY WITH MOBILIZATION SPLENIC FLEXURE (N/A) CYSTOSCOPY WITH STENT PLACEMENT (Left) - Cystoscpy with Left Ureteral Stent Placement Discharge.  He continues to do well.  No evidence of postoperative complication. I think that he should be okay for discharge to home.  LOS: 5 days     Lodema Pilot DAVID 02/16/2013

## 2013-02-16 NOTE — Progress Notes (Signed)
Discharge instructions reviewed with patient, questions and concerns answered, vital signs are stable, patient with no complaints of pain or discomfort and tolerating diet, last bowel movement was 02/15/13, patient to follow up with MD Biagio Quint within 2-3 weeks, IV removed and intact Stanford Breed RN 02-16-2013 7:46am

## 2013-02-26 ENCOUNTER — Encounter (INDEPENDENT_AMBULATORY_CARE_PROVIDER_SITE_OTHER): Payer: Self-pay | Admitting: General Surgery

## 2013-02-26 ENCOUNTER — Ambulatory Visit (INDEPENDENT_AMBULATORY_CARE_PROVIDER_SITE_OTHER): Payer: BC Managed Care – PPO | Admitting: General Surgery

## 2013-02-26 VITALS — BP 112/78 | HR 82 | Resp 18 | Ht 73.0 in | Wt 177.0 lb

## 2013-02-26 DIAGNOSIS — Z4889 Encounter for other specified surgical aftercare: Secondary | ICD-10-CM

## 2013-02-26 DIAGNOSIS — Z5189 Encounter for other specified aftercare: Secondary | ICD-10-CM

## 2013-02-26 NOTE — Progress Notes (Signed)
Subjective:     Patient ID: [name], male   DOB: [birth year], 57 y.o.   MRN: [id number]  HPI This patient follows up 2 weeks status post arthroscopic sigmoid colectomy for persistent diverticulitis. He has recovered very well from the surgery and really has no significant complaints.  He has some bruising in his left flank but otherwise he is off pain medications and his bowels are functioning normally. He is tolerating regular diet and his pathology was benign he is moving his bowels about once per day  Review of Systems     Objective:   Physical Exam In no acute distress and nontoxic-appearing syncopal in a chair His abdomen is soft nontender exam his incisions are healing very nicely without any evidence of a hernia. He has some bruising in his left flank and likely a smaller fluid in his left hemiscrotum I'm not certain as to the cause of this but this bruising appears to be in the later stages of bruising and it appears to be resolving    Assessment:     status post laparoscopic sigmoid colectomy-doing well He seems to be doing very well from this procedure and has recovered very nicely.  His bowels are functioning normally and he is tolerating regular diet his pathology was benign. I think his bruising is likely postoperative but appears to be a resolving spontaneously. I recommended a high-fiber diet and that he followup in about 3-6 months for  Colonoscopy since this was never able to be completed preoperatively. I recommended that he take another week off of work and then he can gradually increase his activity as tolerated.    Plan:     He is doing very well from his surgery and I recommended that he take another week of likely that he can gradually increase his activity as tolerated. We will plan for followup in 3 months with colonoscopy.  Otherwise resume high fiber diet

## 2013-03-03 ENCOUNTER — Encounter (INDEPENDENT_AMBULATORY_CARE_PROVIDER_SITE_OTHER): Payer: Self-pay | Admitting: *Deleted

## 2013-03-03 ENCOUNTER — Telehealth (INDEPENDENT_AMBULATORY_CARE_PROVIDER_SITE_OTHER): Payer: Self-pay | Admitting: *Deleted

## 2013-03-03 NOTE — Telephone Encounter (Signed)
Patient called to request a return to work letter for next Monday 03/08/13.  Letter has been done but patient didn't have fax number so he will call back with fax number.

## 2013-03-03 NOTE — Telephone Encounter (Signed)
Letter faxed to [employer] [phone] per patient

## 2013-03-18 ENCOUNTER — Encounter: Payer: Self-pay | Admitting: Internal Medicine

## 2013-05-06 ENCOUNTER — Ambulatory Visit: Payer: BC Managed Care – PPO | Admitting: *Deleted

## 2013-05-06 VITALS — Ht 73.0 in | Wt 185.0 lb

## 2013-05-06 DIAGNOSIS — K5732 Diverticulitis of large intestine without perforation or abscess without bleeding: Secondary | ICD-10-CM

## 2013-05-06 MED ORDER — MOVIPREP 100 G PO SOLR: ORAL | Status: DC

## 2013-05-07 ENCOUNTER — Encounter: Payer: Self-pay | Admitting: Internal Medicine

## 2013-05-18 ENCOUNTER — Ambulatory Visit (AMBULATORY_SURGERY_CENTER): Payer: BC Managed Care – PPO | Admitting: Internal Medicine

## 2013-05-18 ENCOUNTER — Encounter: Payer: Self-pay | Admitting: Internal Medicine

## 2013-05-18 VITALS — BP 119/86 | HR 68 | Temp 96.8°F | Resp 18 | Ht 73.0 in | Wt 185.0 lb

## 2013-05-18 DIAGNOSIS — Z86010 Personal history of colonic polyps: Secondary | ICD-10-CM

## 2013-05-18 DIAGNOSIS — D126 Benign neoplasm of colon, unspecified: Secondary | ICD-10-CM

## 2013-05-18 MED ORDER — SODIUM CHLORIDE 0.9 % IV SOLN: 500.0000 mL | INTRAVENOUS | Status: DC

## 2013-05-18 NOTE — Progress Notes (Signed)
Called to room to assist during endoscopic procedure.  Patient ID and intended procedure confirmed with present staff. Received instructions for my participation in the procedure from the performing physician.  

## 2013-05-18 NOTE — Progress Notes (Signed)
Patient did not experience any of the following events: a burn prior to discharge; a fall within the facility; wrong site/side/patient/procedure/implant event; or a hospital transfer or hospital admission upon discharge from the facility. (G8907) Patient did not have preoperative order for IV antibiotic SSI prophylaxis. ([id number])  

## 2013-05-18 NOTE — Op Note (Signed)
[facility] [address] [place], [address]   COLONOSCOPY PROCEDURE REPORT  PATIENT: [name], [name]  MR#: [id number] BIRTHDATE: 1956/01/09 , 56  yrs. old GENDER: Male ENDOSCOPIST: Roxy Cedar, MD REFERRED XB:[id number] Program Recall PROCEDURE DATE:  05/18/2013 PROCEDURE:   Colonoscopy with snare polypectomy    x 3 ASA CLASS:   Class II INDICATIONS:Patient's personal history of adenomatous colon polyps. Index 01-2010 (Multiple (6) and advanced (>26mm,TVA) MEDICATIONS: MAC sedation, administered by CRNA and propofol (Diprivan) 300mg  IV  DESCRIPTION OF PROCEDURE:   After the risks benefits and alternatives of the procedure were thoroughly explained, informed consent was obtained.  A digital rectal exam revealed no abnormalities of the rectum.   The LB [id number]  endoscope was introduced through the anus and advanced to the cecum, which was identified by both the appendix and ileocecal valve. No adverse events experienced.   The quality of the prep was excellent, using MoviPrep  The instrument was then slowly withdrawn as the colon was fully examined.      COLON FINDINGS: Three diminutive polyps were found in the transverse colon, descending colon, and sigmoid colon.  A polypectomy was performed with a cold snare.  The resection was complete and the polyp tissue was completely retrieved.   There was evidence of a prior colo-colonic surgical anastomosis in the sigmoid colon, at 20 cm.   The colon mucosa was otherwise normal.  Retroflexed views revealed internal hemorrhoids. The time to cecum=3 minutes 05 seconds.  Withdrawal time=9 minutes 13 seconds.  The scope was withdrawn and the procedure completed. COMPLICATIONS: There were no complications.  ENDOSCOPIC IMPRESSION: 1.   Three polyps were found in the transverse, descending, and sigmoid colon; polypectomy was performed with a cold snare 2.   There was evidence of a prior colo-colonic  surgical anastomosis in the sigmoid colon 3.   The colon mucosa was otherwise normal  RECOMMENDATIONS: 1. Repeat Colonoscopy in 3 years.   eSigned:  Roxy Cedar, MD 05/18/2013 9:12 AM  cc: Willow Ora, MD, Lodema Pilot DO, and The Patient   PATIENT NAME:  [name], [name] MR#: [id number]

## 2013-05-18 NOTE — Patient Instructions (Signed)
YOU HAD AN ENDOSCOPIC PROCEDURE TODAY AT THE [facility]: Refer to the procedure report that was given to you for any specific questions about what was found during the examination.  If the procedure report does not answer your questions, please call your gastroenterologist to clarify.  If you requested that your care partner not be given the details of your procedure findings, then the procedure report has been included in a sealed envelope for you to review at your convenience later.  YOU SHOULD EXPECT: Some feelings of bloating in the abdomen. Passage of more gas than usual.  Walking can help get rid of the air that was put into your GI tract during the procedure and reduce the bloating. If you had a lower endoscopy (such as a colonoscopy or flexible sigmoidoscopy) you may notice spotting of blood in your stool or on the toilet paper. If you underwent a bowel prep for your procedure, then you may not have a normal bowel movement for a few days.  DIET: Your first meal following the procedure should be a light meal and then it is ok to progress to your normal diet.  A half-sandwich or bowl of soup is an example of a good first meal.  Heavy or fried foods are harder to digest and may make you feel nauseous or bloated.  Likewise meals heavy in dairy and vegetables can cause extra gas to form and this can also increase the bloating.  Drink plenty of fluids but you should avoid alcoholic beverages for 24 hours.  ACTIVITY: Your care partner should take you home directly after the procedure.  You should plan to take it easy, moving slowly for the rest of the day.  You can resume normal activity the day after the procedure however you should NOT DRIVE or use heavy machinery for 24 hours (because of the sedation medicines used during the test).    SYMPTOMS TO REPORT IMMEDIATELY: A gastroenterologist can be reached at any hour.  During normal business hours, 8:30 AM to 5:00 PM Monday through Friday,  call [phone].  After hours and on weekends, please call the GI answering service at [phone] who will take a message and have the physician on call contact you.   Following lower endoscopy (colonoscopy or flexible sigmoidoscopy):  Excessive amounts of blood in the stool  Significant tenderness or worsening of abdominal pains  Swelling of the abdomen that is new, acute  Fever of 100F or higher    FOLLOW UP: If any biopsies were taken you will be contacted by phone or by letter within the next 1-3 weeks.  Call your gastroenterologist if you have not heard about the biopsies in 3 weeks.  Our staff will call the home number listed on your records the next business day following your procedure to check on you and address any questions or concerns that you may have at that time regarding the information given to you following your procedure. This is a courtesy call and so if there is no answer at the home number and we have not heard from you through the emergency physician on call, we will assume that you have returned to your regular daily activities without incident.  SIGNATURES/CONFIDENTIALITY: You and/or your care partner have signed paperwork which will be entered into your electronic medical record.  These signatures attest to the fact that that the information above on your After Visit Summary has been reviewed and is understood.  Full responsibility of the confidentiality   of this discharge information lies with you and/or your care-partner.     

## 2013-05-19 ENCOUNTER — Telehealth: Payer: Self-pay

## 2013-05-19 NOTE — Telephone Encounter (Signed)
  Follow up Call-  Call back number 05/18/2013  Post procedure Call Back phone  # [phone]  Permission to leave phone message Yes     Patient questions:  Do you have a fever, pain , or abdominal swelling? no Pain Score  0 *  Have you tolerated food without any problems? yes  Have you been able to return to your normal activities? yes  Do you have any questions about your discharge instructions: Diet   no Medications  no Follow up visit  no  Do you have questions or concerns about your Care? no  Actions: * If pain score is 4 or above: No action needed, pain <4.   No problems per the pt. Maw

## 2013-05-25 ENCOUNTER — Encounter: Payer: Self-pay | Admitting: Internal Medicine

## 2013-12-20 ENCOUNTER — Telehealth: Payer: Self-pay

## 2013-12-20 NOTE — Telephone Encounter (Addendum)
Left message for call back identifiable Medication List and allergies:  Reviewed and updated  90 day supply/mail order: na Local prescriptions: [facility] [address]---or---[facility] [address]  Immunizations due: admin flu vaccine today  A/P:   No changes to FH, PSH or Personal Hx Flu vaccine--at visit Tdap--02/2009 [facility]--05/2013--Dr Perry--multiple adenomas--next due 2017 PSA--none on file  To Discuss with Provider: Topical cream for pain Sleep apnea (his wife advised him to ask) Or could the snoring be allergy related

## 2013-12-22 ENCOUNTER — Encounter: Payer: Self-pay | Admitting: Internal Medicine

## 2013-12-22 ENCOUNTER — Ambulatory Visit (INDEPENDENT_AMBULATORY_CARE_PROVIDER_SITE_OTHER): Payer: BC Managed Care – PPO | Admitting: Internal Medicine

## 2013-12-22 VITALS — BP 125/90 | HR 99 | Temp 97.9°F | Ht 72.5 in | Wt 188.0 lb

## 2013-12-22 DIAGNOSIS — Z23 Encounter for immunization: Secondary | ICD-10-CM

## 2013-12-22 DIAGNOSIS — G4733 Obstructive sleep apnea (adult) (pediatric): Secondary | ICD-10-CM

## 2013-12-22 DIAGNOSIS — R0683 Snoring: Secondary | ICD-10-CM | POA: Insufficient documentation

## 2013-12-22 DIAGNOSIS — M2550 Pain in unspecified joint: Secondary | ICD-10-CM | POA: Insufficient documentation

## 2013-12-22 DIAGNOSIS — Z0000 Encounter for general adult medical examination without abnormal findings: Secondary | ICD-10-CM

## 2013-12-22 LAB — CBC WITH DIFFERENTIAL/PLATELET
BASOS PCT: 0.5 % (ref 0.0–3.0)
Basophils Absolute: 0 10*3/uL (ref 0.0–0.1)
EOS ABS: 0 10*3/uL (ref 0.0–0.7)
EOS PCT: 0.6 % (ref 0.0–5.0)
HEMATOCRIT: 47.1 % (ref 39.0–52.0)
HEMOGLOBIN: 15.7 g/dL (ref 13.0–17.0)
LYMPHS ABS: 2.1 10*3/uL (ref 0.7–4.0)
Lymphocytes Relative: 29.5 % (ref 12.0–46.0)
MCHC: 33.3 g/dL (ref 30.0–36.0)
MCV: 96.4 fl (ref 78.0–100.0)
MONO ABS: 0.5 10*3/uL (ref 0.1–1.0)
Monocytes Relative: 7.6 % (ref 3.0–12.0)
NEUTROS ABS: 4.5 10*3/uL (ref 1.4–7.7)
Neutrophils Relative %: 61.8 % (ref 43.0–77.0)
Platelets: 261 10*3/uL (ref 150.0–400.0)
RBC: 4.89 Mil/uL (ref 4.22–5.81)
RDW: 13.6 % (ref 11.5–14.6)
WBC: 7.3 10*3/uL (ref 4.5–10.5)

## 2013-12-22 MED ORDER — DICLOFENAC SODIUM 1 % TD GEL: TRANSDERMAL | Status: DC

## 2013-12-22 NOTE — Progress Notes (Signed)
Pre visit review using our clinic review tool, if applicable. No additional management support is needed unless otherwise documented below in the visit note. 

## 2013-12-22 NOTE — Assessment & Plan Note (Signed)
Long history of snoring, the patient's wife noted a decrease in O2 sat at night when he was at the hospital last tyear.  He does feel sleepy on Saturdays afternoon when he does not need to be focus. ? sleep apnea, some of the symptoms may be masked by adderral  Plan: home sleep study

## 2013-12-22 NOTE — Patient Instructions (Signed)
Get your blood work before you leave   Next visit is for a physical exam in 1 year, fasting Please make an appointment     Check the  blood pressure monthly be sure it is between 110/60 and 140/85. Ideal blood pressure is 120/80. If it is consistently higher or lower, let me know  Zostavax??

## 2013-12-22 NOTE — Progress Notes (Signed)
   Subjective:    Patient ID: TEEGAN BRANDIS, male    DOB: 12/31/1955, 58 y.o.   MRN: [id number]  HPI Complete physical exam We also talked about wrist ,  leg pain\ and sleep apnea?  Past Medical History  Diagnosis Date  . ADD (attention deficit disorder)     adult- per psych  . Insomnia     per psych  . Heart murmur     Echo 2003 "thickened and somehow myomateous MV, mild MR"  . Fall 4-10    fx left hip and wrist  . Diverticulitis     Perforated sigmoid diverticulitis   . Adenomatous colon polyp    Past Surgical History  Procedure Laterality Date  . Nose biopsy  2011    Dr Allyson Sabal, no cancer  . Hip surgery  2011    left  . Wrist surgery  2011    left  . Laparoscopic sigmoid colectomy N/A 02/11/2013    Procedure: LAPAROSCOPIC SIGMOID COLECTOMY WITH MOBILIZATION SPLENIC FLEXURE;  Surgeon: Madilyn Hook, DO;  Location: [facility];  Service: General;  Laterality: N/A;   History   Social History  . Marital Status: Married    Spouse Name: [name]    Number of Children: 3  . Years of Education: N/A   Occupational History  . [employer]  [employer]   Social History Main Topics  . Smoking status: Never Smoker   . Smokeless tobacco: Never Used  . Alcohol Use: 4.8 oz/week    8 Glasses of wine per week     Comment: MODERATION  . Drug Use: No  . Sexual Activity: Not on file   Other Topics Concern  . Not on file   Social History Narrative            Family History  Problem Relation Age of Onset  . Breast cancer Mother   . Lung cancer Father     smoker  . Diabetes Neg Hx   . Heart failure Mother   . Hypertension Neg Hx   . Colon cancer Neg Hx   . Prostate cancer Neg Hx      Review of Systems Diet-- healthy Exercise-- active, golf, walks x 3/week No  CP, SOB, lower extremity edema Denies  nausea, vomiting diarrhea Denies  blood in the stools No GERD  Sx. (-) cough, sputum production No dysuria, gross hematuria, difficulty urinating       Objective:     Physical Exam BP 125/90  Pulse 99  Temp(Src) 97.9 F (36.6 C)  Ht 6' 0.5" (1.842 m)  Wt 188 lb (85.276 kg)  BMI 25.13 kg/m2  SpO2 96%  General -- alert, well-developed, NAD.  Neck --no thyromegaly , normal carotid pulse  Lungs -- normal respiratory effort, no intercostal retractions, no accessory muscle use, and normal breath sounds.  Heart-- normal rate, regular rhythm, no murmur.  Abdomen-- Not distended, good bowel sounds,soft, non-tender. No rebound or rigidity. No mass,organomegaly. Rectal-- No external abnormalities noted. Normal sphincter tone. No rectal masses or tenderness. Brown stool   Prostate--Prostate gland firm and smooth, slt  enlargement, no nodularity, tenderness, mass, asymmetry or induration. Extremities-- no pretibial edema bilaterally  Neurologic--  alert & oriented X3. Speech normal, gait normal, strength normal in all extremities.  Psych-- Cognition and judgment appear intact. Cooperative with normal attention span and concentration. No anxious or depressed appearing.     Assessment & Plan:

## 2013-12-22 NOTE — Assessment & Plan Note (Signed)
Td 2010 Had a flu shot zostavax discussed   [facility]--05/2013--Dr Perry--multiple adenomas--next due 2017  Continue with healthy lifestyle Labs

## 2013-12-22 NOTE — Assessment & Plan Note (Signed)
Occasional ache in the left wrist and left hip areas where he had surgery a few years ago, symptoms usually related to cold weather. Pain does not interfere with any of his hobbies. Plan: Motrin and voltaren gel as needed.

## 2013-12-23 ENCOUNTER — Telehealth: Payer: Self-pay | Admitting: *Deleted

## 2013-12-23 LAB — COMPREHENSIVE METABOLIC PANEL
ALBUMIN: 4.1 g/dL (ref 3.5–5.2)
ALT: 24 U/L (ref 0–53)
AST: 28 U/L (ref 0–37)
Alkaline Phosphatase: 47 U/L (ref 39–117)
BILIRUBIN TOTAL: 1.1 mg/dL (ref 0.3–1.2)
BUN: 17 mg/dL (ref 6–23)
CO2: 26 mEq/L (ref 19–32)
Calcium: 9.2 mg/dL (ref 8.4–10.5)
Chloride: 103 mEq/L (ref 96–112)
Creatinine, Ser: 1 mg/dL (ref 0.4–1.5)
GFR: 82.39 mL/min (ref >60.00)
GLUCOSE: 90 mg/dL (ref 70–99)
Potassium: 4 mEq/L (ref 3.5–5.1)
Sodium: 136 mEq/L (ref 135–145)
Total Protein: 7 g/dL (ref 6.0–8.3)

## 2013-12-23 LAB — LDL CHOLESTEROL, DIRECT: Direct LDL: 132.5 mg/dL

## 2013-12-23 LAB — LIPID PANEL
CHOL/HDL RATIO: 3
Cholesterol: 222 mg/dL — ABNORMAL HIGH (ref 0–200)
HDL: 71.3 mg/dL (ref >39.00)
TRIGLYCERIDES: 91 mg/dL (ref 0.0–149.0)
VLDL: 18.2 mg/dL (ref 0.0–40.0)

## 2013-12-23 LAB — TSH: TSH: 3.71 u[IU]/mL (ref 0.35–5.50)

## 2013-12-23 LAB — PSA: PSA: 0.86 ng/mL (ref 0.10–4.00)

## 2013-12-23 NOTE — Telephone Encounter (Signed)
Prior authorization paperwork for Voltaren gel submitted for review. Awaiting response. [id number]

## 2013-12-27 ENCOUNTER — Encounter: Payer: Self-pay | Admitting: *Deleted

## 2014-01-04 ENCOUNTER — Telehealth: Payer: Self-pay | Admitting: Internal Medicine

## 2014-01-04 DIAGNOSIS — G4733 Obstructive sleep apnea (adult) (pediatric): Secondary | ICD-10-CM

## 2014-01-04 NOTE — Telephone Encounter (Signed)
Sleep study denied by his insurance company. I entered  a referral to see pulmonary, I think is important that we rule out sleep apnea. David,please notify the patient

## 2014-01-05 MED ORDER — FLUTICASONE PROPIONATE 50 MCG/ACT NA SUSP: 2.0000 | Freq: Every day | NASAL | Status: DC

## 2014-01-05 NOTE — Addendum Note (Signed)
Addended by: Peggyann Shoals on: 01/05/2014 01:27 PM   Modules accepted: Orders

## 2014-01-05 NOTE — Telephone Encounter (Signed)
Pt notified. Pt also requesting a nasal spray. Pleas advise.

## 2014-01-05 NOTE — Telephone Encounter (Signed)
Done

## 2014-01-05 NOTE — Telephone Encounter (Signed)
Call in Flonase 2 sprays in each side of the nose daily #1 and 12 refills

## 2014-02-01 ENCOUNTER — Ambulatory Visit (INDEPENDENT_AMBULATORY_CARE_PROVIDER_SITE_OTHER): Payer: BC Managed Care – PPO | Admitting: Pulmonary Disease

## 2014-02-01 ENCOUNTER — Encounter: Payer: Self-pay | Admitting: Pulmonary Disease

## 2014-02-01 VITALS — BP 120/86 | HR 106 | Temp 98.1°F | Ht 73.0 in | Wt 192.8 lb

## 2014-02-01 DIAGNOSIS — R0609 Other forms of dyspnea: Secondary | ICD-10-CM

## 2014-02-01 DIAGNOSIS — R0683 Snoring: Secondary | ICD-10-CM

## 2014-02-01 DIAGNOSIS — R0989 Other specified symptoms and signs involving the circulatory and respiratory systems: Secondary | ICD-10-CM

## 2014-02-01 NOTE — Patient Instructions (Signed)
Will refer you to ENT for upper airway evaluation, understanding your nasal anatomy may have nothing to do with your sleep apnea.  We can discuss further whether to do a sleep study or not after your visit with ENT.

## 2014-02-01 NOTE — Assessment & Plan Note (Addendum)
The patient's history is suggestive of some degree of sleep disordered breathing, but it is unclear whether it is severe enough to be a health risk. He is not sure that he wants to do a formal study, and insurance has turned down a home sleep test.  Unfortunately, the patient really does not have a lot of weight to lose, and therefore we have to decide about a different approach. One advantage of doing a formal sleep study is that we can tailor other therapy such as surgery or a dental appliance if he only has mild OSA. However, if he has more moderate to severe disease, CPAP would be his best option. The other approach is to consider nasal surgery since this is bothering him during his waking hours and exercise, and then we can see how this affects his sleep once it is done. The patient would like to see otolaryngology first for their input, and then we can decide about whether to do a sleep study.

## 2014-02-01 NOTE — Progress Notes (Signed)
Subjective:    Patient ID: [name], male    DOB: September 10, 1956, 58 y.o.   MRN: [id number]  HPI The patient is a 58 year old male who I've been asked to see for possible sleep apnea. He has been noted to have snoring by his wife, as well as occasional witnessed apneas. The patient also notes choking arousals at times. He does awaken multiple times during the night, but he is unsure if rested or not in the mornings upon arising. He does have some degree of sleep pressure in the afternoons with periods of inactivity, but also takes Adderall for attention deficit disorder. He also notes fatigue greater than sleepiness in the evenings while sitting and watching television. He does have sleepiness with driving longer distances. The patient's weight is increased about 10 pounds over the last 2 years, and his Epworth score today is 13.   Sleep Questionnaire What time do you typically go to bed?( Between what hours) 9-10 9-10 at 1513 on 02/01/14 by Virl Cagey, CMA How long does it take you to fall asleep? 10-64mins 10-23mins at 1513 on 02/01/14 by Virl Cagey, CMA How many times during the night do you wake up? No Value multiple times at 1513 on 02/01/14 by Virl Cagey, CMA What time do you get out of bed to start your day? [id number] at 1513 on 02/01/14 by Virl Cagey, CMA Do you drive or operate heavy machinery in your occupation? No No at 1513 on 02/01/14 by Virl Cagey, CMA How much has your weight changed (up or down) over the past two years? (In pounds) 10 lb (4.536 kg) 10 lb (4.536 kg) at 1513 on 02/01/14 by Virl Cagey, CMA Have you ever had a sleep study before? NoNo home sleep test denied by insurance at 1513 on 02/01/14 by Virl Cagey, CMA Do you currently use CPAP? No No at 1513 on 02/01/14 by Virl Cagey, CMA If so, what pressure? Do you wear oxygen at any time? No No at 1513 on 02/01/14 by Virl Cagey, CMA   Review of Systems  Constitutional:  Negative for fever and unexpected weight change.  HENT: Negative for congestion, dental problem, ear pain, nosebleeds, postnasal drip, rhinorrhea, sinus pressure, sneezing, sore throat and trouble swallowing.   Eyes: Negative for redness and itching.  Respiratory: Negative for cough, chest tightness, shortness of breath and wheezing.   Cardiovascular: Negative for palpitations and leg swelling.  Gastrointestinal: Negative for nausea and vomiting.  Genitourinary: Negative for dysuria.  Musculoskeletal: Negative for joint swelling.  Skin: Negative for rash.  Neurological: Negative for headaches.  Hematological: Does not bruise/bleed easily.  Psychiatric/Behavioral: Negative for dysphoric mood. The patient is not nervous/anxious.        Objective:   Physical Exam Constitutional:  Well developed, no acute distress  HENT:  Nares with deviated septum to right with narrowing  Oropharynx without exudate, palate and uvula are minimally abnormal  Eyes:  Perrla, eomi, no scleral icterus  Neck:  No JVD, no TMG  Cardiovascular:  Normal rate, regular rhythm, no rubs or gallops.  No murmurs        Intact distal pulses  Pulmonary :  Normal breath sounds, no stridor or respiratory distress   No rales, rhonchi, or wheezing  Abdominal:  Soft, nondistended, bowel sounds present.  No tenderness noted.   Musculoskeletal:  No lower extremity edema noted.  Lymph Nodes:  No cervical lymphadenopathy noted  Skin:  No  cyanosis noted  Neurologic:  Alert, appropriate, moves all 4 extremities without obvious deficit.         Assessment & Plan:

## 2014-10-25 ENCOUNTER — Ambulatory Visit (INDEPENDENT_AMBULATORY_CARE_PROVIDER_SITE_OTHER): Payer: BC Managed Care – PPO | Admitting: Internal Medicine

## 2014-10-25 ENCOUNTER — Encounter: Payer: Self-pay | Admitting: Internal Medicine

## 2014-10-25 VITALS — BP 133/87 | HR 97 | Temp 98.3°F | Wt 190.5 lb

## 2014-10-25 DIAGNOSIS — K5730 Diverticulosis of large intestine without perforation or abscess without bleeding: Secondary | ICD-10-CM

## 2014-10-25 DIAGNOSIS — J0100 Acute maxillary sinusitis, unspecified: Secondary | ICD-10-CM

## 2014-10-25 DIAGNOSIS — K5732 Diverticulitis of large intestine without perforation or abscess without bleeding: Secondary | ICD-10-CM

## 2014-10-25 DIAGNOSIS — R1032 Left lower quadrant pain: Secondary | ICD-10-CM

## 2014-10-25 MED ORDER — AMOXICILLIN-POT CLAVULANATE 875-125 MG PO TABS: 1.0000 | ORAL_TABLET | Freq: Two times a day (BID) | ORAL | Status: DC

## 2014-10-25 NOTE — Progress Notes (Signed)
Subjective:    Patient ID: [name], male    DOB: 03/05/1956, 58 y.o.   MRN: [id number]  DOS:  10/25/2014 Type of visit - description : Acute Interval history: Respiratory symptoms started approximately 2 weeks ago with facial pain, congestion, nasal discharge, using OTC nasal steroids, he discontinued them because he saw some bloody nasal discharge. He is also using Sudafed as needed Wife has similar symptoms but she got better already   ROS Denies fever or chills No nausea, vomiting, diarrhea. No actual chest congestion, sputum production or cough  Past Medical History  Diagnosis Date  . ADD (attention deficit disorder)     adult- per psych  . Insomnia     per psych  . Heart murmur     Echo 2003 "thickened and somehow myomateous MV, mild MR"  . Fall 4-10    fx left hip and wrist  . Diverticulitis     Perforated sigmoid diverticulitis   . Adenomatous colon polyp     Past Surgical History  Procedure Laterality Date  . Nose biopsy  2011    Dr Allyson Sabal, no cancer  . Hip surgery  2011    left  . Wrist surgery  2011    left  . Laparoscopic sigmoid colectomy N/A 02/11/2013    Procedure: LAPAROSCOPIC SIGMOID COLECTOMY WITH MOBILIZATION SPLENIC FLEXURE;  Surgeon: Madilyn Hook, DO;  Location: [facility];  Service: General;  Laterality: N/A;    History   Social History  . Marital Status: Married    Spouse Name: [name]    Number of Children: 3  . Years of Education: N/A   Occupational History  . [employer]  [employer]   Social History Main Topics  . Smoking status: Never Smoker   . Smokeless tobacco: Never Used  . Alcohol Use: 4.8 oz/week    8 Glasses of wine per week     Comment: MODERATION  . Drug Use: No  . Sexual Activity: Not on file   Other Topics Concern  . Not on file   Social History Narrative                 Medication List       This list is accurate as of: 10/25/14  5:29 PM.  Always use your most recent med list.             amoxicillin-clavulanate 875-125 MG per tablet  Commonly known as:  AUGMENTIN  Take 1 tablet by mouth 2 (two) times daily.     amphetamine-dextroamphetamine 30 MG tablet  Commonly known as:  ADDERALL  Take 30 mg by mouth daily before breakfast.     CENTRUM ULTRA MENS Tabs  Take by mouth. Three times a week--Monday, Wednesday, Friday     Fish Oil 1200 MG Caps  Take by mouth. Three times a week--Monday, Wednesday, Friday.     fluticasone 50 MCG/ACT nasal spray  Commonly known as:  FLONASE  Place 2 sprays into both nostrils daily.     traZODone 100 MG tablet  Commonly known as:  DESYREL  Take 50 mg by mouth at bedtime.           Objective:   Physical Exam BP 133/87 mmHg  Pulse 97  Temp(Src) 98.3 F (36.8 C) (Oral)  Wt 190 lb 8 oz (86.41 kg)  SpO2 97%  General -- alert, well-developed, NAD.  HEENT-- Not pale.  R Ear-- normal L ear-- normal Throat symmetric, no redness  or discharge. Face symmetric, sinuses slt tender to palpation at maxillary area B. Nose   congested. Lungs -- normal respiratory effort, no intercostal retractions, no accessory muscle use, and normal breath sounds.  Heart-- normal rate, regular rhythm, no murmur. Neurologic--  alert & oriented X3. Speech normal, gait appropriate for age, strength symmetric and appropriate for age. Psych-- Cognition and judgment appear intact. Cooperative with normal attention span and concentration. No anxious or depressed appearing.     Assessment & Plan:    Sinusitis Symptoms consistent with sinusitis, request something "stronger than amoxicillin", recommend Augmentin. See instructions

## 2014-10-25 NOTE — Patient Instructions (Signed)
Rest, fluids , tylenol If  cough, take Mucinex DM twice a day as needed  If nasal  congestion use OTC Nasocort: 2 nasal sprays on each side of the nose daily until you feel better Get pseudoephedrine 30 mg (behind the counter, you need to talk with the pharmacist) take one tablet 3 or 4 times a day as needed for congestion Take the antibiotic as prescribed  (Augmentin) Call if not gradually better over the next  10 days Call anytime if the symptoms are severe  Next visit for a physical exam by February 2016

## 2014-10-25 NOTE — Progress Notes (Signed)
Pre visit review using our clinic review tool, if applicable. No additional management support is needed unless otherwise documented below in the visit note. 

## 2014-11-18 DIAGNOSIS — N433 Hydrocele, unspecified: Secondary | ICD-10-CM

## 2014-11-18 HISTORY — DX: Hydrocele, unspecified: N43.3

## 2014-11-28 ENCOUNTER — Ambulatory Visit (INDEPENDENT_AMBULATORY_CARE_PROVIDER_SITE_OTHER): Payer: BC Managed Care – PPO | Admitting: Internal Medicine

## 2014-11-28 ENCOUNTER — Encounter: Payer: Self-pay | Admitting: Internal Medicine

## 2014-11-28 VITALS — BP 145/99 | HR 91 | Temp 98.1°F | Wt 193.2 lb

## 2014-11-28 DIAGNOSIS — J069 Acute upper respiratory infection, unspecified: Secondary | ICD-10-CM

## 2014-11-28 MED ORDER — TOBRAMYCIN 0.3 % OP SOLN: 1.0000 [drp] | Freq: Four times a day (QID) | OPHTHALMIC | Status: DC

## 2014-11-28 MED ORDER — AZELASTINE HCL 0.1 % NA SOLN: 2.0000 | Freq: Every evening | NASAL | Status: DC | PRN

## 2014-11-28 NOTE — Progress Notes (Signed)
Subjective:    Patient ID: [name], male    DOB: Nov 28, 1955, 59 y.o.   MRN: [id number]  DOS:  11/28/2014 Type of visit - description : acute Interval history: Symptoms started ~ 4 days ago suddenly w/ eye irritation, eye discharge in the mornings, itching; also cough, ear pressure , sore throat. Taking Allegra and Flonase. Was seen last month with sinusitis, status post Augmentin, he did get 100% better.   ROS No fever chills, mild frontal headache, sinus pressure and mild congestion. No nausea, vomiting, diarrhea Occasionally produces a small amount of greenish sputum mostly in the mornings  Past Medical History  Diagnosis Date  . ADD (attention deficit disorder)     adult- per psych  . Insomnia     per psych  . Heart murmur     Echo 2003 "thickened and somehow myomateous MV, mild MR"  . Fall 4-10    fx left hip and wrist  . Diverticulitis     Perforated sigmoid diverticulitis   . Adenomatous colon polyp     Past Surgical History  Procedure Laterality Date  . Nose biopsy  2011    Dr Allyson Sabal, no cancer  . Hip surgery  2011    left  . Wrist surgery  2011    left  . Laparoscopic sigmoid colectomy N/A 02/11/2013    Procedure: LAPAROSCOPIC SIGMOID COLECTOMY WITH MOBILIZATION SPLENIC FLEXURE;  Surgeon: Madilyn Hook, DO;  Location: [facility];  Service: General;  Laterality: N/A;    History   Social History  . Marital Status: Married    Spouse Name: [name]    Number of Children: 3  . Years of Education: N/A   Occupational History  . [employer]  [employer]   Social History Main Topics  . Smoking status: Never Smoker   . Smokeless tobacco: Never Used  . Alcohol Use: 4.8 oz/week    8 Glasses of wine per week     Comment: MODERATION  . Drug Use: No  . Sexual Activity: Not on file   Other Topics Concern  . Not on file   Social History Narrative                 Medication List       This list is accurate as of: 11/28/14 11:59 PM.  Always use  your most recent med list.               amphetamine-dextroamphetamine 30 MG tablet  Commonly known as:  ADDERALL  Take 30 mg by mouth daily before breakfast.     azelastine 0.1 % nasal spray  Commonly known as:  ASTELIN  Place 2 sprays into both nostrils at bedtime as needed for rhinitis. Use in each nostril as directed     CENTRUM ULTRA MENS Tabs  Take by mouth. Three times a week--Monday, Wednesday, Friday     Fish Oil 1200 MG Caps  Take by mouth. Three times a week--Monday, Wednesday, Friday.     fluticasone 50 MCG/ACT nasal spray  Commonly known as:  FLONASE  Place 2 sprays into both nostrils daily.     tobramycin 0.3 % ophthalmic solution  Commonly known as:  TOBREX  Place 1 drop into both eyes every 6 (six) hours.     traZODone 100 MG tablet  Commonly known as:  DESYREL  Take 50 mg by mouth at bedtime.           Objective:   Physical  Exam BP 145/99 mmHg  Pulse 91  Temp(Src) 98.1 F (36.7 C) (Oral)  Wt 193 lb 4 oz (87.658 kg)  SpO2 99% General -- alert, well-developed, NAD.  HEENT-- Not pale.  R Ear-- normal L ear-- normal Throat symmetric, no redness or discharge. Face symmetric, sinuses not tender to palpation. Nose slt congested. Eyes: Conjunctiva slightly irritated, worse on the left, no discharge. EOMI, PERRLA, anterior chambers normal Lungs -- normal respiratory effort, no intercostal retractions, no accessory muscle use, and normal breath sounds.  Heart-- normal rate, regular rhythm, no murmur.  Extremities-- no pretibial edema bilaterally  Neurologic--  alert & oriented X3. Speech normal, gait appropriate for age, strength symmetric and appropriate for age.  Psych-- Cognition and judgment appear intact. Cooperative with normal attention span and concentration. No anxious or depressed appearing.        Assessment & Plan:   URI, Symptoms likely related to a URI with predominant eye symptoms. Will treat conservatively, see instructions. If eyes  are not getting better in the next few days will rx  an antibiotic for 5 days.

## 2014-11-28 NOTE — Progress Notes (Signed)
Pre visit review using our clinic review tool, if applicable. No additional management support is needed unless otherwise documented below in the visit note. 

## 2014-11-28 NOTE — Patient Instructions (Signed)
Rest, fluids , tylenol If  cough, take Mucinex DM twice a day as needed  Use  Flonase : 2 nasal sprays on each side of the nose daily until you feel better At nighttime use Astelin, a different nasal spray  Put a  cold compresses over the eyes twice a day If you're not improving use the eyedrops and prescribing   Call if not gradually better over the next  10 days Call anytime if the symptoms are severe

## 2014-12-15 ENCOUNTER — Encounter: Payer: Self-pay | Admitting: Medical

## 2014-12-15 ENCOUNTER — Ambulatory Visit (INDEPENDENT_AMBULATORY_CARE_PROVIDER_SITE_OTHER): Payer: BC Managed Care – PPO | Admitting: Medical

## 2014-12-15 VITALS — BP 144/90 | HR 114 | Temp 98.6°F | Ht 73.0 in | Wt 191.6 lb

## 2014-12-15 DIAGNOSIS — J0101 Acute recurrent maxillary sinusitis: Secondary | ICD-10-CM

## 2014-12-15 DIAGNOSIS — J0100 Acute maxillary sinusitis, unspecified: Secondary | ICD-10-CM | POA: Insufficient documentation

## 2014-12-15 MED ORDER — CEFDINIR 300 MG PO CAPS: 300.0000 mg | ORAL_CAPSULE | Freq: Two times a day (BID) | ORAL | Status: DC

## 2014-12-15 MED ORDER — HYDROCODONE-HOMATROPINE 5-1.5 MG/5ML PO SYRP: 5.0000 mL | ORAL_SOLUTION | Freq: Three times a day (TID) | ORAL | Status: DC | PRN

## 2014-12-15 NOTE — Patient Instructions (Addendum)
Your appear to have a sinus infection. I am prescribing cefdinir  antibiotic for the infection. To help with the nasal congestion use your  nasal steroid flonase. For your associated cough, I prescribed cough medicine hydromet.  If this does not improve symptoms by Monday call me and will  Consider rx short course prednisone.  Your pulse is 113 today and in past 4 visits the pulse has been little on high side with borderline blood pressure. I recommend getting otc cuff and checking both daily. Call us with the readings.(I am less concerned now after finding out you are on adderal)  Rest, hydrate, tylenol for fever.  Follow up in 7 days or as needed.

## 2014-12-15 NOTE — Assessment & Plan Note (Addendum)
Your appear to have a sinus infection. I am prescribing cefdinir  antibiotic for the infection. To help with the nasal congestion use your nasal steroid flonase. For your associated cough, I prescribed cough medicine hydromet.  If this does not improve symptoms by Monday call me and will  Consider rx short course prednisone.  Note regarding the prednisone. I would make sure not having worse infection type symptoms before writing.

## 2014-12-15 NOTE — Progress Notes (Signed)
Pre visit review using our clinic review tool, if applicable. No additional management support is needed unless otherwise documented below in the visit note. 

## 2014-12-15 NOTE — Progress Notes (Signed)
Subjective:    Patient ID: [name], male    DOB: [birth year], 59 y.o.   MRN: [id number]  HPI  Pt in with some nasal congestion,ear pressure, cough at night, and  sinus pain x 3 wks. Pt seen by Dr. Larose Kells and he gave astelin.   Pt only given nasal spray. No antibiotic given.  Pt used to see allergist. Occasionally used prednisone in the past. Recent itching eyes.  Past Medical History  Diagnosis Date  . ADD (attention deficit disorder)     adult- per psych  . Insomnia     per psych  . Heart murmur     Echo 2003 "thickened and somehow myomateous MV, mild MR"  . Fall 4-10    fx left hip and wrist  . Diverticulitis     Perforated sigmoid diverticulitis   . Adenomatous colon polyp     History   Social History  . Marital Status: Married    Spouse Name: [name]    Number of Children: 3  . Years of Education: N/A   Occupational History  . [employer]  [employer]   Social History Main Topics  . Smoking status: Never Smoker   . Smokeless tobacco: Never Used  . Alcohol Use: 4.8 oz/week    8 Glasses of wine per week     Comment: MODERATION  . Drug Use: No  . Sexual Activity: Not on file   Other Topics Concern  . Not on file   Social History Narrative             Past Surgical History  Procedure Laterality Date  . Nose biopsy  2011    Dr Allyson Sabal, no cancer  . Hip surgery  2011    left  . Wrist surgery  2011    left  . Laparoscopic sigmoid colectomy N/A 02/11/2013    Procedure: LAPAROSCOPIC SIGMOID COLECTOMY WITH MOBILIZATION SPLENIC FLEXURE;  Surgeon: Madilyn Hook, DO;  Location: [facility];  Service: General;  Laterality: N/A;    Family History  Problem Relation Age of Onset  . Breast cancer Mother   . Lung cancer Father     smoker  . Diabetes Neg Hx   . Heart failure Mother   . Hypertension Neg Hx   . Colon cancer Neg Hx   . Prostate cancer Neg Hx     No Known Allergies  Current Outpatient Prescriptions on File Prior to Visit  Medication  Sig Dispense Refill  . amphetamine-dextroamphetamine (ADDERALL) 30 MG tablet Take 30 mg by mouth daily before breakfast.     . azelastine (ASTELIN) 0.1 % nasal spray Place 2 sprays into both nostrils at bedtime as needed for rhinitis. Use in each nostril as directed 30 mL 3  . fluticasone (FLONASE) 50 MCG/ACT nasal spray Place 2 sprays into both nostrils daily. 16 g 12  . Multiple Vitamins-Minerals (CENTRUM ULTRA MENS) TABS Take by mouth. Three times a week--Monday, Wednesday, Friday     . Omega-3 Fatty Acids (FISH OIL) 1200 MG CAPS Take by mouth. Three times a week--Monday, Wednesday, Friday.    . tobramycin (TOBREX) 0.3 % ophthalmic solution Place 1 drop into both eyes every 6 (six) hours. 5 mL 0  . traZODone (DESYREL) 100 MG tablet Take 50 mg by mouth at bedtime.      No current facility-administered medications on file prior to visit.    BP 144/90 mmHg  Pulse 114  Temp(Src) 98.6 F (37 C) (Oral)  Ht 6\' 1"  (1.854 m)  Wt 191 lb 9.6 oz (86.909 kg)  BMI 25.28 kg/m2  SpO2 97%       Review of Systems  Constitutional: Negative for fever, chills and fatigue.  HENT: Positive for congestion, postnasal drip, rhinorrhea and sinus pressure.   Respiratory: Positive for cough. Negative for wheezing.   Cardiovascular: Negative for chest pain and palpitations.  Musculoskeletal: Negative for back pain.  Neurological: Negative for dizziness and headaches.  Hematological: Negative for adenopathy. Does not bruise/bleed easily.       Objective:   Physical Exam   General  Mental Status - Alert. General Appearance - Well groomed. Not in acute distress.  Skin Rashes- No Rashes.  HEENT Head- Normal. Ear Auditory Canal - Left- Normal. Right - Normal.Tympanic Membrane- Left- Normal. Right- Normal. Eye Sclera/Conjunctiva- Left- Normal. Right- Normal. Nose & Sinuses Nasal Mucosa- Left-  Boggy and Congested. Right-  Boggy and  Congested.Bilateral maxillary and frontal sinus pressure. Mouth  & Throat Lips: Upper Lip- Normal: no dryness, cracking, pallor, cyanosis, or vesicular eruption. Lower Lip-Normal: no dryness, cracking, pallor, cyanosis or vesicular eruption. Buccal Mucosa- Bilateral- No Aphthous ulcers. Oropharynx- No Discharge or Erythema. Tonsils: Characteristics- Bilateral- No Erythema or Congestion. Size/Enlargement- Bilateral- No enlargement. Discharge- bilateral-None.  Neck Neck- Supple. No Masses.   Chest and Lung Exam Auscultation: Breath Sounds:-Clear even and unlabored.  Cardiovascular Auscultation:Rythm- Regular, rate and rhythm. Murmurs & Other Heart Sounds:Ausculatation of the heart reveal- No Murmurs.  Lymphatic Head & Neck General Head & Neck Lymphatics: Bilateral: Description- No Localized lymphadenopathy.         Assessment & Plan:

## 2014-12-23 ENCOUNTER — Ambulatory Visit (INDEPENDENT_AMBULATORY_CARE_PROVIDER_SITE_OTHER): Payer: BC Managed Care – PPO | Admitting: Internal Medicine

## 2014-12-23 ENCOUNTER — Other Ambulatory Visit: Payer: Self-pay

## 2014-12-23 ENCOUNTER — Encounter: Payer: Self-pay | Admitting: Internal Medicine

## 2014-12-23 VITALS — BP 111/80 | HR 99 | Temp 98.3°F | Ht 73.0 in | Wt 189.0 lb

## 2014-12-23 DIAGNOSIS — R0683 Snoring: Secondary | ICD-10-CM

## 2014-12-23 DIAGNOSIS — R011 Cardiac murmur, unspecified: Secondary | ICD-10-CM | POA: Insufficient documentation

## 2014-12-23 DIAGNOSIS — J0100 Acute maxillary sinusitis, unspecified: Secondary | ICD-10-CM

## 2014-12-23 DIAGNOSIS — Z0000 Encounter for general adult medical examination without abnormal findings: Secondary | ICD-10-CM

## 2014-12-23 NOTE — Assessment & Plan Note (Signed)
Saw pulmonary 01-2014 and subsequently ENT, he was offered as septoplasty to relieve his symptoms but he has decided not to proceed at this time.

## 2014-12-23 NOTE — Patient Instructions (Signed)
Stop by the front desk and schedule labs to be done within few days (fasting)    Please come back to the office in 1 year  for a physical exam. Come back fasting    Finish the antibiotics, continue taking the nasal spray some cough suppressant.  If not back to normal gradually in the next 10 days please call the office

## 2014-12-23 NOTE — Assessment & Plan Note (Addendum)
Td 2010 zostavax discussed  [facility]S--05/2013--Dr Perry--multiple adenomas--next due 2017  Lifestyle discussed DRE and PSA normal 2015, next check 2017  Labs

## 2014-12-23 NOTE — Assessment & Plan Note (Addendum)
Recently seen with sinusitis, on antibiotics, reports that he is much improved but not completely recovered. Recommend to finish antibiotics, continue using nasal sprays and cough suppressant. To call if not back to normal within 10 days

## 2014-12-23 NOTE — Progress Notes (Signed)
Subjective:    Patient ID: [name], male    DOB: [birth year], 59 y.o.   MRN: [id number]  DOS:  12/23/2014 Type of visit - description : CPX Interval history: Was recently seen with a respiratory infection, on antibiotics, doing better but not 100%.   Review of Systems No fever chills RN and  sore throat much improved No chest pain or difficulty breathing No nausea, vomiting, diarrhea blood in the stools Cough, sputum production decreased compared to a few days ago. No anxiety  depression No skin rashes or unusual bruising. No dysuria, gross hematuria difficulty urinating No headache or dizziness  Past Medical History  Diagnosis Date  . ADD (attention deficit disorder)     adult- per psych  . Insomnia     per psych  . Heart murmur     Echo 2003 "thickened and somehow myomateous MV, mild MR"  . Fall 4-10    fx left hip and wrist  . Diverticulitis     Perforated sigmoid diverticulitis   . Adenomatous colon polyp     Past Surgical History  Procedure Laterality Date  . Nose biopsy  2011    Dr Allyson Sabal, no cancer  . Hip surgery  2011    left  . Wrist surgery  2011    left  . Laparoscopic sigmoid colectomy N/A 02/11/2013    Procedure: LAPAROSCOPIC SIGMOID COLECTOMY WITH MOBILIZATION SPLENIC FLEXURE;  Surgeon: Madilyn Hook, DO;  Location: [facility];  Service: General;  Laterality: N/A;    Family History  Problem Relation Age of Onset  . Breast cancer Mother   . Lung cancer Father     smoker  . Diabetes Neg Hx   . Heart failure Mother   . Hypertension Neg Hx   . Colon cancer Neg Hx   . Prostate cancer Neg Hx   . Heart attack Neg Hx      History   Social History  . Marital Status: Married    Spouse Name: [name]    Number of Children: 3  . Years of Education: N/A   Occupational History  . [employer]  [employer]   Social History Main Topics  . Smoking status: Never Smoker   . Smokeless tobacco: Never Used  . Alcohol Use: 4.8 oz/week    8  Glasses of wine per week     Comment: MODERATION  . Drug Use: No  . Sexual Activity: Not on file   Other Topics Concern  . Not on file   Social History Narrative   3 adult children, 2 g-children   Household-- pt and wife              Medication List       This list is accurate as of: 12/23/14  2:35 PM.  Always use your most recent med list.               amphetamine-dextroamphetamine 30 MG tablet  Commonly known as:  ADDERALL  Take 30 mg by mouth daily before breakfast.     azelastine 0.1 % nasal spray  Commonly known as:  ASTELIN  Place 2 sprays into both nostrils at bedtime as needed for rhinitis. Use in each nostril as directed     cefdinir 300 MG capsule  Commonly known as:  OMNICEF  Take 1 capsule (300 mg total) by mouth 2 (two) times daily.     CENTRUM ULTRA MENS Tabs  Take by mouth. Three times a  week--Monday, Wednesday, Friday     Fish Oil 1200 MG Caps  Take by mouth. Three times a week--Monday, Wednesday, Friday.     fluticasone 50 MCG/ACT nasal spray  Commonly known as:  FLONASE  Place 2 sprays into both nostrils daily.     HYDROcodone-homatropine 5-1.5 MG/5ML syrup  Commonly known as:  HYCODAN  Take 5 mLs by mouth every 8 (eight) hours as needed for cough.     tobramycin 0.3 % ophthalmic solution  Commonly known as:  TOBREX  Place 1 drop into both eyes every 6 (six) hours.     traZODone 100 MG tablet  Commonly known as:  DESYREL  Take 50 mg by mouth at bedtime.           Objective:   Physical Exam  Constitutional: He is oriented to person, place, and time. He appears well-developed. No distress.  HENT:  Head: Normocephalic and atraumatic.  Right Ear: External ear normal.  Left Ear: External ear normal.  Minimal tenderness to palpation at all sinuses both maxillary and frontal  Neck: Normal range of motion. Neck supple. No thyromegaly present.  Normal carotid pulses  Cardiovascular:  RRR, no murmur, rub or gallop  Pulmonary/Chest:  Effort normal. No stridor. No respiratory distress.  CTA B  Abdominal: Soft. Bowel sounds are normal. He exhibits no distension and no mass. There is no tenderness. There is no rebound and no guarding.  Musculoskeletal: Normal range of motion. He exhibits no edema or tenderness.  Lymphadenopathy:    He has no cervical adenopathy.  Neurological: He is alert and oriented to person, place, and time. No cranial nerve deficit. He exhibits normal muscle tone. Coordination normal.  Speech normal, gait unassisted and normal for age, motor strength appropriate for age   Skin: Skin is warm and dry. No pallor.  No jaundice  Psychiatric: He has a normal mood and affect. His behavior is normal. Judgment and thought content normal.  Vitals reviewed.        Assessment & Plan:       Problem List Items Addressed This Visit      Other   Annual physical exam   Relevant Orders   EKG 12-Lead

## 2014-12-23 NOTE — Progress Notes (Signed)
Pre visit review using our clinic review tool, if applicable. No additional management support is needed unless otherwise documented below in the visit note. 

## 2014-12-27 ENCOUNTER — Other Ambulatory Visit: Payer: BC Managed Care – PPO

## 2014-12-28 ENCOUNTER — Other Ambulatory Visit (INDEPENDENT_AMBULATORY_CARE_PROVIDER_SITE_OTHER): Payer: BC Managed Care – PPO

## 2014-12-28 DIAGNOSIS — E039 Hypothyroidism, unspecified: Secondary | ICD-10-CM

## 2014-12-28 DIAGNOSIS — Z0000 Encounter for general adult medical examination without abnormal findings: Secondary | ICD-10-CM

## 2014-12-28 DIAGNOSIS — R739 Hyperglycemia, unspecified: Secondary | ICD-10-CM

## 2014-12-28 LAB — COMPREHENSIVE METABOLIC PANEL
ALBUMIN: 4.2 g/dL (ref 3.5–5.2)
ALT: 23 U/L (ref 0–53)
AST: 24 U/L (ref 0–37)
Alkaline Phosphatase: 57 U/L (ref 39–117)
BUN: 17 mg/dL (ref 6–23)
CALCIUM: 9.5 mg/dL (ref 8.4–10.5)
CO2: 28 meq/L (ref 19–32)
CREATININE: 1.04 mg/dL (ref 0.40–1.50)
Chloride: 101 mEq/L (ref 96–112)
GFR: 77.56 mL/min (ref >60.00)
Glucose, Bld: 106 mg/dL — ABNORMAL HIGH (ref 70–99)
Potassium: 4.1 mEq/L (ref 3.5–5.1)
Sodium: 135 mEq/L (ref 135–145)
Total Bilirubin: 0.6 mg/dL (ref 0.2–1.2)
Total Protein: 7.3 g/dL (ref 6.0–8.3)

## 2014-12-28 LAB — CBC WITH DIFFERENTIAL/PLATELET
BASOS ABS: 0 10*3/uL (ref 0.0–0.1)
Basophils Relative: 0.5 % (ref 0.0–3.0)
EOS ABS: 0 10*3/uL (ref 0.0–0.7)
Eosinophils Relative: 0.6 % (ref 0.0–5.0)
HCT: 46.9 % (ref 39.0–52.0)
Hemoglobin: 16 g/dL (ref 13.0–17.0)
LYMPHS PCT: 29.4 % (ref 12.0–46.0)
Lymphs Abs: 1.9 10*3/uL (ref 0.7–4.0)
MCHC: 34.1 g/dL (ref 30.0–36.0)
MCV: 92.3 fl (ref 78.0–100.0)
MONOS PCT: 10.6 % (ref 3.0–12.0)
Monocytes Absolute: 0.7 10*3/uL (ref 0.1–1.0)
NEUTROS ABS: 3.8 10*3/uL (ref 1.4–7.7)
NEUTROS PCT: 58.9 % (ref 43.0–77.0)
PLATELETS: 246 10*3/uL (ref 150.0–400.0)
RBC: 5.08 Mil/uL (ref 4.22–5.81)
RDW: 13.5 % (ref 11.5–15.5)
WBC: 6.5 10*3/uL (ref 4.0–10.5)

## 2014-12-28 LAB — LIPID PANEL
CHOLESTEROL: 216 mg/dL — AB (ref 0–200)
HDL: 70.6 mg/dL (ref >39.00)
LDL Cholesterol: 121 mg/dL — ABNORMAL HIGH (ref 0–99)
NonHDL: 145.4
TRIGLYCERIDES: 120 mg/dL (ref 0.0–149.0)
Total CHOL/HDL Ratio: 3
VLDL: 24 mg/dL (ref 0.0–40.0)

## 2014-12-28 LAB — TSH: TSH: 6.2 u[IU]/mL — ABNORMAL HIGH (ref 0.35–4.50)

## 2015-01-09 NOTE — Addendum Note (Signed)
Addended by: Wilfrid Lund on: 01/09/2015 08:26 AM   Modules accepted: Orders

## 2015-03-07 ENCOUNTER — Other Ambulatory Visit: Payer: Self-pay

## 2015-03-07 ENCOUNTER — Other Ambulatory Visit: Payer: Self-pay | Admitting: Internal Medicine

## 2015-03-13 ENCOUNTER — Other Ambulatory Visit (INDEPENDENT_AMBULATORY_CARE_PROVIDER_SITE_OTHER): Payer: BC Managed Care – PPO

## 2015-03-13 DIAGNOSIS — R739 Hyperglycemia, unspecified: Secondary | ICD-10-CM

## 2015-03-13 DIAGNOSIS — E039 Hypothyroidism, unspecified: Secondary | ICD-10-CM

## 2015-03-13 LAB — T3, FREE: T3 FREE: 2.9 pg/mL (ref 2.3–4.2)

## 2015-03-13 LAB — HEMOGLOBIN A1C: Hgb A1c MFr Bld: 5.6 % (ref 4.6–6.5)

## 2015-03-13 LAB — TSH: TSH: 4.28 u[IU]/mL (ref 0.35–4.50)

## 2015-03-13 LAB — T4, FREE: Free T4: 0.79 ng/dL (ref 0.60–1.60)

## 2015-05-09 ENCOUNTER — Telehealth: Payer: Self-pay | Admitting: *Deleted

## 2015-05-09 NOTE — Telephone Encounter (Signed)
Pt signed ROI request received via fax from [facility]. Forwarded to [place] to scan/email to medical records. JG/CMA

## 2015-05-26 ENCOUNTER — Encounter: Payer: Self-pay | Admitting: Internal Medicine

## 2015-07-04 ENCOUNTER — Ambulatory Visit (HOSPITAL_BASED_OUTPATIENT_CLINIC_OR_DEPARTMENT_OTHER)
Admission: RE | Admit: 2015-07-04 | Discharge: 2015-07-04 | Disposition: A | Discharge status: Home / Self Care | Payer: BC Managed Care – PPO | Source: Ambulatory Visit | Attending: Internal Medicine | Admitting: Internal Medicine

## 2015-07-04 ENCOUNTER — Other Ambulatory Visit: Payer: Self-pay | Admitting: Internal Medicine

## 2015-07-04 ENCOUNTER — Ambulatory Visit (INDEPENDENT_AMBULATORY_CARE_PROVIDER_SITE_OTHER): Payer: BC Managed Care – PPO | Admitting: Internal Medicine

## 2015-07-04 ENCOUNTER — Ambulatory Visit (HOSPITAL_COMMUNITY)
Admission: RE | Admit: 2015-07-04 | Discharge: 2015-07-04 | Disposition: A | Discharge status: Home / Self Care | Payer: BC Managed Care – PPO | Source: Ambulatory Visit | Attending: Internal Medicine | Admitting: Internal Medicine

## 2015-07-04 ENCOUNTER — Encounter: Payer: Self-pay | Admitting: Internal Medicine

## 2015-07-04 VITALS — BP 138/84 | HR 109 | Temp 97.7°F | Ht 73.0 in | Wt 186.2 lb

## 2015-07-04 DIAGNOSIS — L739 Follicular disorder, unspecified: Secondary | ICD-10-CM | POA: Diagnosis not present

## 2015-07-04 DIAGNOSIS — N433 Hydrocele, unspecified: Secondary | ICD-10-CM | POA: Insufficient documentation

## 2015-07-04 DIAGNOSIS — M2550 Pain in unspecified joint: Secondary | ICD-10-CM

## 2015-07-04 DIAGNOSIS — N5089 Other specified disorders of the male genital organs: Secondary | ICD-10-CM

## 2015-07-04 DIAGNOSIS — N508 Other specified disorders of male genital organs: Secondary | ICD-10-CM

## 2015-07-04 DIAGNOSIS — N448 Other noninflammatory disorders of the testis: Secondary | ICD-10-CM | POA: Diagnosis present

## 2015-07-04 MED ORDER — CLINDAMYCIN PHOSPHATE 1 % EX GEL: Freq: Two times a day (BID) | CUTANEOUS | Status: DC | PRN

## 2015-07-04 NOTE — Assessment & Plan Note (Signed)
Has occasional barbae  folliculitis, uses ziana prn(a combination of clindamycin and tretimoin),  prescribed by dermatology, states that one tube lasted 3 years  : Will refill clindamycin to use as needed.

## 2015-07-04 NOTE — Progress Notes (Signed)
Pre visit review using our clinic review tool, if applicable. No additional management support is needed unless otherwise documented below in the visit note. 

## 2015-07-04 NOTE — Progress Notes (Signed)
Subjective:    Patient ID: [name], male    DOB: 04/04/1956, 59 y.o.   MRN: [id number]  DOS:  07/04/2015 Type of visit - description : Acute, several concerns Interval history:  Left testicle has been larger for years according to the patient, in the last 2 years it has been growing slowly. Needs a refill on a facial cream that he uses sporadically. Suffers from chronic feet pain, had shoe inserts made 5 years ago, apparently they need to be replaced.  Review of Systems  Denies testicular pain but he feels some "pulling". No dysuria, gross hematuria or difficulty urinating  Denies abdominal pain, nausea or vomiting.   Past Medical History  Diagnosis Date  . ADD (attention deficit disorder)     adult- per psych  . Insomnia     per psych  . Heart murmur     Echo 2003 "thickened and somehow myomateous MV, mild MR"  . Fall 4-10    fx left hip and wrist  . Diverticulitis     Perforated sigmoid diverticulitis   . Adenomatous colon polyp     Past Surgical History  Procedure Laterality Date  . Nose biopsy  2011    Dr Allyson Sabal, no cancer  . Hip surgery  2011    left  . Wrist surgery  2011    left  . Laparoscopic sigmoid colectomy N/A 02/11/2013    Procedure: LAPAROSCOPIC SIGMOID COLECTOMY WITH MOBILIZATION SPLENIC FLEXURE;  Surgeon: Madilyn Hook, DO;  Location: [facility];  Service: General;  Laterality: N/A;    Social History   Social History  . Marital Status: Married    Spouse Name: [name]  . Number of Children: 3  . Years of Education: N/A   Occupational History  . [employer]  [employer]   Social History Main Topics  . Smoking status: Never Smoker   . Smokeless tobacco: Never Used  . Alcohol Use: 4.8 oz/week    8 Glasses of wine per week     Comment: MODERATION  . Drug Use: No  . Sexual Activity: Not on file   Other Topics Concern  . Not on file   Social History Narrative   3 adult children, 2 g-children   Household-- pt and wife              Medication List       This list is accurate as of: 07/04/15  8:42 AM.  Always use your most recent med list.               amphetamine-dextroamphetamine 30 MG tablet  Commonly known as:  ADDERALL  Take 30 mg by mouth daily before breakfast.     azelastine 0.1 % nasal spray  Commonly known as:  ASTELIN  Place 2 sprays into both nostrils at bedtime as needed for rhinitis. Use in each nostril as directed     CENTRUM ULTRA MENS Tabs  Take by mouth. Three times a week--Monday, Wednesday, Friday     Fish Oil 1200 MG Caps  Take by mouth. Three times a week--Monday, Wednesday, Friday.     fluticasone 50 MCG/ACT nasal spray  Commonly known as:  FLONASE  Place 2 sprays into both nostrils daily.     tobramycin 0.3 % ophthalmic solution  Commonly known as:  TOBREX  Place 1 drop into both eyes every 6 (six) hours.     traZODone 100 MG tablet  Commonly known as:  DESYREL  Take  50 mg by mouth at bedtime.           Objective:   Physical Exam BP 138/84 mmHg  Pulse 109  Temp(Src) 97.7 F (36.5 C) (Oral)  Ht 6\' 1"  (1.854 m)  Wt 186 lb 4 oz (84.482 kg)  BMI 24.58 kg/m2  SpO2 94% General:   Well developed, well nourished . NAD.  HEENT:  Normocephalic . Face symmetric, atraumatic  Abdomen:  Not distended, soft, non-tender. No rebound or rigidity. No inguinal lymphadenopathies GU: Penis normal Right testicle normal, 42 cm Left testicle: He has a hard mass is about 125 cm, nontender  Skin: Not pale. Not jaundice Neurologic:  alert & oriented X3.  Speech normal, gait appropriate for age and unassisted Psych--  Cognition and judgment appear intact.  Cooperative with normal attention span and concentration.  Behavior appropriate. No anxious or depressed appearing.    Assessment & Plan:

## 2015-07-04 NOTE — Assessment & Plan Note (Signed)
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 Ultrasound was done, it showed a hydrocele. Patient aware of results, referring to urology given size and discomfort.

## 2015-07-04 NOTE — Patient Instructions (Signed)
   Stop by the first floor and get the [place]

## 2015-07-04 NOTE — Assessment & Plan Note (Signed)
Chronic feet pain, shoe inserts help. Referred to Dr. Tamala Julian

## 2015-07-06 ENCOUNTER — Encounter: Payer: Self-pay | Admitting: Internal Medicine

## 2015-07-11 ENCOUNTER — Ambulatory Visit (INDEPENDENT_AMBULATORY_CARE_PROVIDER_SITE_OTHER): Payer: BC Managed Care – PPO | Admitting: Family Medicine

## 2015-07-11 ENCOUNTER — Encounter: Payer: Self-pay | Admitting: Family Medicine

## 2015-07-11 VITALS — BP 122/80 | HR 101 | Ht 73.0 in | Wt 188.0 lb

## 2015-07-11 DIAGNOSIS — M2550 Pain in unspecified joint: Secondary | ICD-10-CM

## 2015-07-11 NOTE — Assessment & Plan Note (Signed)
Patient's foot pain I think is multifactorial. Patient is not wearing the correct shoes. We will get patient set up for custom orthotics but we discussed with him about the possibility of over-the-counter orthotics. We discussed icing regimen, home exercises and over-the-counter natural supplementations that could be beneficial. Patient will try to make these changes and come back and see me again in 3-4 weeks for further evaluation and treatment.

## 2015-07-11 NOTE — Patient Instructions (Signed)
Nice to meet you Be sure to wear supportive shoes, rigid is better  Good shoes with rigid bottom.  [name], [place] or New balance greater then 700 Add a multivitamin with Iron ( at least 65 mg elemental iron Add B12 1000units/day  Consider adding Vitamin D 2000units/day  Rehab exercises 3x/week to work on circulation  We will set you up for orthotics.  See me again in 3-4 weeks.

## 2015-07-11 NOTE — Progress Notes (Signed)
[name] [address], [place] [address] Phone: [phone] Subjective:    I'm seeing this patient by the request  of:  Kathlene November, MD   CC: Left foot pain  SWN:[id number] [name] is a 59 y.o. male coming in with complaint of left foot pain. Patient states that both of his feet seem to give him chronic pain but states the left one is significantly worse. Patient states that greater than 5 years ago he was given custom orthotics and continues to wear them but feels like they may not be walk helping as much as they were previously. Patient states over the course of time he has noticed discomfort in the feet. Patient states this seems to be more generalized. Denies any numbness but states came be some tingling. Denies anything that seems to wake him up at night but does have cramping intermittently. Nothing that stops him from activity. States that the severity is 4 out of 10 because it is more frustrating. Seems to be worsening very slowly. Denies any weakness. Patient is only walking for exercise at this time.  Past Medical History  Diagnosis Date  . ADD (attention deficit disorder)     adult- per psych  . Insomnia     per psych  . Heart murmur     Echo 2003 "thickened and somehow myomateous MV, mild MR"  . Fall 4-10    fx left hip and wrist  . Diverticulitis     Perforated sigmoid diverticulitis   . Adenomatous colon polyp    Past Surgical History  Procedure Laterality Date  . Nose biopsy  2011    Dr Allyson Sabal, no cancer  . Hip surgery  2011    left  . Wrist surgery  2011    left  . Laparoscopic sigmoid colectomy N/A 02/11/2013    Procedure: LAPAROSCOPIC SIGMOID COLECTOMY WITH MOBILIZATION SPLENIC FLEXURE;  Surgeon: Madilyn Hook, DO;  Location: [facility];  Service: General;  Laterality: N/A;   Social History  Substance Use Topics  . Smoking status: Never Smoker   . Smokeless tobacco: Never Used  . Alcohol Use: 4.8 oz/week    8 Glasses of  wine per week     Comment: MODERATION   No Known Allergies Family History  Problem Relation Age of Onset  . Breast cancer Mother   . Lung cancer Father     smoker  . Diabetes Neg Hx   . Heart failure Mother   . Hypertension Neg Hx   . Colon cancer Neg Hx   . Prostate cancer Neg Hx   . Heart attack Neg Hx        Past medical history, social, surgical and family history all reviewed in electronic medical record.   Review of Systems: No headache, visual changes, nausea, vomiting, diarrhea, constipation, dizziness, abdominal pain, skin rash, fevers, chills, night sweats, weight loss, swollen lymph nodes, body aches, joint swelling, muscle aches, chest pain, shortness of breath, mood changes.   Objective Blood pressure 122/80, pulse 101, height 6\' 1"  (1.854 m), weight 188 lb (85.276 kg), SpO2 97 %.  General: No apparent distress alert and oriented x3 mood and affect normal, dressed appropriately.  HEENT: Pupils equal, extraocular movements intact  Respiratory: Patient's speak in full sentences and does not appear short of breath  Cardiovascular: No lower extremity edema, non tender, no erythema  Skin: Warm dry intact with no signs of infection or rash on extremities or on axial  skeleton.  Abdomen: Soft nontender  Neuro: Cranial nerves II through XII are intact, neurovascularly intact in all extremities with 2+ DTRs and 2+ pulses.  Lymph: No lymphadenopathy of posterior or anterior cervical chain or axillae bilaterally.  Gait normal with good balance and coordination.  MSK:  Non tender with full range of motion and good stability and symmetric strength and tone of shoulders, elbows, wrist, hip, knee and ankles bilaterally.  Foot exam shows Morton's toe bilaterally. Mild hammering of the fourth and fifth toes bilaterally. No callus formation on the plantar aspect of the foot. No significant down her overpronation of the foot. Very neutral. Mild coolness to palpation of the toes but  neurovascularly intact distally.  Procedure note 07680; 15 minutes spent for Therapeutic exercises as stated in above notes.  This included exercises focusing on stretching, strengthening, with significant focus on eccentric aspects. Exercises for the foot include:  Stretches to help lengthen the lower leg and plantar fascia areas Theraband exercises for the lower leg and ankle to help strengthen the surrounding area- dorsiflexion, plantarflexion, inversion, eversion Massage rolling on the plantar surface of the foot with a frozen bottle, tennis ball or golf ball Towel or marble pick-ups to strengthen the plantar surface of the foot Weight bearing exercises to increase balance and overall stability  Proper technique shown and discussed handout in great detail with ATC.  All questions were discussed and answered.     Impression and Recommendations:     This case required medical decision making of moderate complexity.

## 2015-07-11 NOTE — Progress Notes (Signed)
Pre visit review using our clinic review tool, if applicable. No additional management support is needed unless otherwise documented below in the visit note. 

## 2015-07-17 ENCOUNTER — Telehealth: Payer: Self-pay | Admitting: Internal Medicine

## 2015-07-17 NOTE — Telephone Encounter (Signed)
[name] with [facility] Can be reached: [phone] Fax# [phone]  Reason for call: Please fax OV from 12/23/14

## 2015-07-18 ENCOUNTER — Encounter: Payer: Self-pay | Admitting: Family Medicine

## 2015-07-18 ENCOUNTER — Ambulatory Visit (INDEPENDENT_AMBULATORY_CARE_PROVIDER_SITE_OTHER): Payer: BC Managed Care – PPO | Admitting: Family Medicine

## 2015-07-18 DIAGNOSIS — M2550 Pain in unspecified joint: Secondary | ICD-10-CM | POA: Diagnosis not present

## 2015-07-18 NOTE — Progress Notes (Signed)
Patient was fitted for a : standard, cushioned, semi-rigid orthotic. The orthotic was heated and afterward the patient was in a seated position and the orthotic molded. The patient was positioned in subtalar neutral position and 10 degrees of ankle dorsiflexion in a non-weight bearing stance. After completion of molding, patient did have orthotic management which included instructions on acclimating to the orthotics, signs of ill fit as well as care for the orthotic.  I spent approximately 84minutes with the patient fitting the orthotics, discussing modifications that can be made, adjusting to other footwear and discussing care.   The blank was ground to a stable position for weight bearing. Size: 11.5 (igli Active)  Base: Carbon fiber Additional Posting and Padding: The following postings were fitted onto the molded orthotics to help maintain a talar neutral position - Wedge posting for transverse arch:  438/38    Silicone posting for longitudinal arch:  250/120 x2 bilaterally    Also lateral calcaneal postings bilaterally 250/35  The patient ambulated these, and they were very comfortable and supportive.

## 2015-07-18 NOTE — Patient Instructions (Signed)
Acclimating to your Igli orthotics -  ° °We recommend that you allow up to 2 weeks for you to fully acclimate to your new custom orthotics. Please use the following recommended plan to build into full day wear.  ° °Day 1 - 2hours/day °Every day afterwards add 1 hour of wear(3hrs/day, 4hrs/day, etc) until you are able to wear them for an entire day without issues.  ° °If you notice any irritation or increasing discomfort with your new orthotics, please do not hesitate in contacting the office(leave a message for [name] or [name]) or sending [name] a message through MyChart to arrange a time to review your fit.  ° °Enjoy your new orthotics!!  ° °[name] ° ° ° °

## 2015-07-18 NOTE — Telephone Encounter (Signed)
Refer to 6/21 phone note.

## 2015-07-18 NOTE — Telephone Encounter (Signed)
ROI was received on 05/09/2015 and was sent to medical records. I went ahead and faxed 12/23/14 OV note to [facility] at [phone] successfully. JG//CMA

## 2015-07-18 NOTE — Assessment & Plan Note (Signed)
Patient orthotics today. We discussed wearing them slowly over the course of time. We discussed icing regimen. Patient continue with intermittent therapy and see me again in 2-4 weeks for further evaluation and treatment.

## 2015-08-01 ENCOUNTER — Ambulatory Visit (INDEPENDENT_AMBULATORY_CARE_PROVIDER_SITE_OTHER): Payer: BC Managed Care – PPO | Admitting: Family Medicine

## 2015-08-01 ENCOUNTER — Encounter: Payer: Self-pay | Admitting: Family Medicine

## 2015-08-01 VITALS — BP 124/84 | HR 109 | Ht 73.0 in | Wt 189.0 lb

## 2015-08-01 DIAGNOSIS — M2550 Pain in unspecified joint: Secondary | ICD-10-CM

## 2015-08-01 NOTE — Progress Notes (Signed)
[name] [address], [place] [address] Phone: [phone] Subjective:    I'm seeing this patient by the request  of:  Darryl November, MD   CC: Left foot pain  [name] is a 59 y.o. male coming in with complaint of left foot pain. Patient states that both of his feet seem to give him chronic pain but states the left one is significantly worse. Patient was putting custom orthotics, doing over-the-counter natural supplements and doing the home exercises. Patient states that he is about 60-70% better. States that he still aware the feet but not having any significant pain anymore. Patient states that he notices his feet are not is fatigued at the end of the day. Patient states even standing and walking seems to be more tolerable. Patient is actually very happy with the results.  Past Medical History  Diagnosis Date  . ADD (attention deficit disorder)     adult- per psych  . Insomnia     per psych  . Heart murmur     Echo 2003 "thickened and somehow myomateous MV, mild MR"  . Fall 4-10    fx left hip and wrist  . Diverticulitis     Perforated sigmoid diverticulitis   . Adenomatous colon polyp    Past Surgical History  Procedure Laterality Date  . Nose biopsy  2011    Dr Allyson Sabal, no cancer  . Hip surgery  2011    left  . Wrist surgery  2011    left  . Laparoscopic sigmoid colectomy N/A 02/11/2013    Procedure: LAPAROSCOPIC SIGMOID COLECTOMY WITH MOBILIZATION SPLENIC FLEXURE;  Surgeon: Madilyn Hook, DO;  Location: [facility];  Service: General;  Laterality: N/A;   Social History  Substance Use Topics  . Smoking status: Never Smoker   . Smokeless tobacco: Never Used  . Alcohol Use: 4.8 oz/week    8 Glasses of wine per week     Comment: MODERATION   No Known Allergies Family History  Problem Relation Age of Onset  . Breast cancer Mother   . Lung cancer Father     smoker  . Diabetes Neg Hx   . Heart failure Mother   .  Hypertension Neg Hx   . Colon cancer Neg Hx   . Prostate cancer Neg Hx   . Heart attack Neg Hx        Past medical history, social, surgical and family history all reviewed in electronic medical record.   Review of Systems: No headache, visual changes, nausea, vomiting, diarrhea, constipation, dizziness, abdominal pain, skin rash, fevers, chills, night sweats, weight loss, swollen lymph nodes, body aches, joint swelling, muscle aches, chest pain, shortness of breath, mood changes.   Objective Blood pressure 124/84, pulse 109, height 6\' 1"  (1.854 m), weight 189 lb (85.73 kg), SpO2 94 %.  General: No apparent distress alert and oriented x3 mood and affect normal, dressed appropriately.  HEENT: Pupils equal, extraocular movements intact  Respiratory: Patient's speak in full sentences and does not appear short of breath  Cardiovascular: No lower extremity edema, non tender, no erythema  Skin: Warm dry intact with no signs of infection or rash on extremities or on axial skeleton.  Abdomen: Soft nontender  Neuro: Cranial nerves II through XII are intact, neurovascularly intact in all extremities with 2+ DTRs and 2+ pulses.  Lymph: No lymphadenopathy of posterior or anterior cervical chain or axillae bilaterally.  Gait normal with good balance and  coordination.  MSK:  Non tender with full range of motion and good stability and symmetric strength and tone of shoulders, elbows, wrist, hip, knee and ankles bilaterally.  Foot exam shows Morton's toe bilaterally. Mild hammering of the fourth and fifth toes bilaterally. No callus formation on the plantar aspect of the foot. Neutral foot mild increase in warmth of toes from previous exam.     Impression and Recommendations:     This case required medical decision making of moderate complexity.

## 2015-08-01 NOTE — Progress Notes (Signed)
Pre visit review using our clinic review tool, if applicable. No additional management support is needed unless otherwise documented below in the visit note. 

## 2015-08-01 NOTE — Patient Instructions (Signed)
Good to see you You are doing great!!! Continue the walking and get new shoes.  Continue the vitamin D  Turmeric 500mg  twice daily See me again in 6 weeks if not perfect.

## 2015-08-01 NOTE — Assessment & Plan Note (Signed)
Patient is doing significantly better at this time. Encourage him to continue to wear the custom orthotics on a regular basis. We discussed the getting new shoes would be beneficial. Other over-the-counter medications. Patient come back again in 6 weeks for further evaluation.

## 2015-08-24 ENCOUNTER — Other Ambulatory Visit: Payer: Self-pay | Admitting: Urology

## 2015-10-27 ENCOUNTER — Encounter (HOSPITAL_BASED_OUTPATIENT_CLINIC_OR_DEPARTMENT_OTHER): Payer: Self-pay | Admitting: *Deleted

## 2015-10-27 NOTE — Progress Notes (Signed)
Pt instructed npo pmn 12/14.  To [facility] 12/15 @ 0600.needs hgb on arrival.

## 2015-10-31 ENCOUNTER — Encounter (HOSPITAL_BASED_OUTPATIENT_CLINIC_OR_DEPARTMENT_OTHER): Payer: Self-pay

## 2015-10-31 NOTE — H&P (Signed)
Urology History and Physical Exam  CC: Left scrotal swelling  HPI: 59 year old male presents for left hydrocelectomy. The patient has been symptomatic with this for some time. He desires surgical management.  PMH: Past Medical History  Diagnosis Date  . ADD (attention deficit disorder)     adult- per psych  . Insomnia     per psych  . Heart murmur     Echo 2003 "thickened and somehow myomateous MV, mild MR"  . Fall 4-10    fx left hip and wrist  . Diverticulitis     Perforated sigmoid diverticulitis   . Adenomatous colon polyp   . Hydrocele 2016    Large, symptomatic left hydrocele, planned hydrocelectomy in 10/2015 with Dr. Diona Fanti  . Cancer ([place])     basal cell skin cancer   . Wears glasses     PSH: Past Surgical History  Procedure Laterality Date  . Nose biopsy  2011    Dr Allyson Sabal, no cancer  . Hip surgery Left 2011    ORIF femur  . Wrist surgery  2011    left  . Laparoscopic sigmoid colectomy N/A 02/11/2013    Procedure: LAPAROSCOPIC SIGMOID COLECTOMY WITH MOBILIZATION SPLENIC FLEXURE;  Surgeon: Madilyn Hook, DO;  Location: [facility];  Service: General;  Laterality: N/A;    Allergies: No Known Allergies  Medications: No prescriptions prior to admission     Social History: Social History   Social History  . Marital Status: Married    Spouse Name: [name]  . Number of Children: 3  . Years of Education: N/A   Occupational History  . [employer]   Social History Main Topics  . Smoking status: Never Smoker   . Smokeless tobacco: Never Used  . Alcohol Use: 3.6 oz/week    6 Glasses of wine per week     Comment: MODERATION  . Drug Use: No  . Sexual Activity: Not on file   Other Topics Concern  . Not on file   Social History Narrative   3 adult children, 2 g-children   Household-- pt and wife          Family History: Family History  Problem Relation Age of Onset  . Breast cancer Mother   . Lung cancer Father    smoker  . Diabetes Neg Hx   . Heart failure Mother   . Hypertension Neg Hx   . Colon cancer Neg Hx   . Prostate cancer Neg Hx   . Heart attack Neg Hx     Review of Systems: Positive: Left scrotal swelling Negative:   A further 10 point review of systems was negative except what is listed in the HPI.                  Physical Exam: @VITALS2 @ General: No acute distress.  Awake. Head:  Normocephalic.  Atraumatic. ENT:  EOMI.  Mucous membranes moist Neck:  Supple.  No lymphadenopathy. CV:  S1 present. S2 present. Regular rate. Pulmonary: Equal effort bilaterally.  Clear to auscultation bilaterally. Abdomen: Soft.  Non- tender to palpation. Skin:  Normal turgor.  No visible rash. Extremity: No gross deformity of bilateral upper extremities.  No gross deformity of                             lower extremities. Neurologic: Alert. Appropriate mood.  Penis:  circumcised.  No lesions. Urethra: Orthotopic  meatus. Scrotum: No lesions.  No ecchymosis.  No erythema. Left-sided hydrocele Testicles: Descended bilaterally.  No masses bilaterally. Epididymis: Palpable bilaterally. Nontender to palpation.  Studies:  No results for input(s): HGB, WBC, PLT in the last 72 hours.  No results for input(s): NA, K, CL, CO2, BUN, CREATININE, CALCIUM, GFRNONAA, GFRAA in the last 72 hours.  Invalid input(s): MAGNESIUM   No results for input(s): INR, APTT in the last 72 hours.  Invalid input(s): PT   Invalid input(s): ABG    Assessment:  Left-sided hydrocele  Plan: Hydrocelectomy

## 2015-11-02 ENCOUNTER — Ambulatory Visit (HOSPITAL_BASED_OUTPATIENT_CLINIC_OR_DEPARTMENT_OTHER): Payer: BC Managed Care – PPO | Admitting: Certified Registered"

## 2015-11-02 ENCOUNTER — Encounter (HOSPITAL_BASED_OUTPATIENT_CLINIC_OR_DEPARTMENT_OTHER): Payer: Self-pay

## 2015-11-02 ENCOUNTER — Ambulatory Visit (HOSPITAL_BASED_OUTPATIENT_CLINIC_OR_DEPARTMENT_OTHER)
Admission: RE | Admit: 2015-11-02 | Discharge: 2015-11-02 | Disposition: A | Discharge status: Home / Self Care | Payer: BC Managed Care – PPO | Source: Ambulatory Visit | Attending: Urology | Admitting: Urology

## 2015-11-02 ENCOUNTER — Encounter (HOSPITAL_BASED_OUTPATIENT_CLINIC_OR_DEPARTMENT_OTHER)
Admission: RE | Disposition: A | Discharge status: Home / Self Care | Payer: Self-pay | Source: Ambulatory Visit | Attending: Urology

## 2015-11-02 DIAGNOSIS — N433 Hydrocele, unspecified: Secondary | ICD-10-CM | POA: Insufficient documentation

## 2015-11-02 DIAGNOSIS — N5089 Other specified disorders of the male genital organs: Secondary | ICD-10-CM | POA: Diagnosis present

## 2015-11-02 HISTORY — DX: Malignant (primary) neoplasm, unspecified: C80.1

## 2015-11-02 HISTORY — DX: Presence of spectacles and contact lenses: Z97.3

## 2015-11-02 HISTORY — PX: HYDROCELE EXCISION: SHX482

## 2015-11-02 LAB — POCT HEMOGLOBIN-HEMACUE: HEMOGLOBIN: 14.2 g/dL (ref 13.0–17.0)

## 2015-11-02 SURGERY — HYDROCELECTOMY
Anesthesia: General | Site: Scrotum | Laterality: Left

## 2015-11-02 MED ORDER — WHITE PETROLATUM GEL
Status: AC
  Filled 2015-11-02: qty 5

## 2015-11-02 MED ORDER — MIDAZOLAM HCL 5 MG/5ML IJ SOLN
INTRAMUSCULAR | Status: DC | PRN
  Administered 2015-11-02: 2 mg via INTRAVENOUS

## 2015-11-02 MED ORDER — PROPOFOL 10 MG/ML IV BOLUS
INTRAVENOUS | Status: AC
  Filled 2015-11-02: qty 40

## 2015-11-02 MED ORDER — LIDOCAINE HCL (CARDIAC) 20 MG/ML IV SOLN
INTRAVENOUS | Status: AC
  Filled 2015-11-02: qty 5

## 2015-11-02 MED ORDER — LACTATED RINGERS IV SOLN
INTRAVENOUS | Status: DC
  Administered 2015-11-02 (×2): via INTRAVENOUS
  Filled 2015-11-02: qty 1000

## 2015-11-02 MED ORDER — BUPIVACAINE HCL (PF) 0.25 % IJ SOLN
INTRAMUSCULAR | Status: DC | PRN
  Administered 2015-11-02: 15 mL

## 2015-11-02 MED ORDER — KETOROLAC TROMETHAMINE 30 MG/ML IJ SOLN
INTRAMUSCULAR | Status: AC
  Filled 2015-11-02: qty 1

## 2015-11-02 MED ORDER — HYDROCODONE-ACETAMINOPHEN 5-325 MG PO TABS: 1.0000 | ORAL_TABLET | ORAL | Status: DC | PRN

## 2015-11-02 MED ORDER — ONDANSETRON HCL 4 MG/2ML IJ SOLN
INTRAMUSCULAR | Status: AC
  Filled 2015-11-02: qty 2

## 2015-11-02 MED ORDER — PROMETHAZINE HCL 25 MG/ML IJ SOLN
6.2500 mg | INTRAMUSCULAR | Status: DC | PRN
  Filled 2015-11-02: qty 1

## 2015-11-02 MED ORDER — CEFAZOLIN SODIUM 1-5 GM-% IV SOLN
1.0000 g | INTRAVENOUS | Status: DC
  Filled 2015-11-02: qty 50

## 2015-11-02 MED ORDER — HYDROCODONE-ACETAMINOPHEN 5-325 MG PO TABS
1.0000 | ORAL_TABLET | Freq: Four times a day (QID) | ORAL | Status: DC | PRN
  Administered 2015-11-02: 1 via ORAL
  Filled 2015-11-02: qty 1

## 2015-11-02 MED ORDER — SODIUM CHLORIDE 0.9 % IR SOLN
Status: DC | PRN
  Administered 2015-11-02: 500 mL

## 2015-11-02 MED ORDER — EPHEDRINE SULFATE 50 MG/ML IJ SOLN
INTRAMUSCULAR | Status: DC | PRN
  Administered 2015-11-02: 10 mg via INTRAVENOUS

## 2015-11-02 MED ORDER — LIDOCAINE HCL (CARDIAC) 20 MG/ML IV SOLN
INTRAVENOUS | Status: DC | PRN
  Administered 2015-11-02: 60 mg via INTRAVENOUS

## 2015-11-02 MED ORDER — DEXAMETHASONE SODIUM PHOSPHATE 4 MG/ML IJ SOLN
INTRAMUSCULAR | Status: DC | PRN
  Administered 2015-11-02: 8 mg via INTRAVENOUS

## 2015-11-02 MED ORDER — CEFAZOLIN SODIUM-DEXTROSE 2-3 GM-% IV SOLR
2.0000 g | INTRAVENOUS | Status: AC
  Administered 2015-11-02: 2 g via INTRAVENOUS
  Filled 2015-11-02: qty 50

## 2015-11-02 MED ORDER — DEXAMETHASONE SODIUM PHOSPHATE 10 MG/ML IJ SOLN
INTRAMUSCULAR | Status: AC
  Filled 2015-11-02: qty 1

## 2015-11-02 MED ORDER — FENTANYL CITRATE (PF) 100 MCG/2ML IJ SOLN
INTRAMUSCULAR | Status: DC | PRN
  Administered 2015-11-02: 25 ug via INTRAVENOUS
  Administered 2015-11-02: 50 ug via INTRAVENOUS
  Administered 2015-11-02: 25 ug via INTRAVENOUS

## 2015-11-02 MED ORDER — PROPOFOL 10 MG/ML IV BOLUS
INTRAVENOUS | Status: DC | PRN
  Administered 2015-11-02: 180 mg via INTRAVENOUS

## 2015-11-02 MED ORDER — HYDROCODONE-ACETAMINOPHEN 5-325 MG PO TABS
ORAL_TABLET | ORAL | Status: AC
  Filled 2015-11-02: qty 1

## 2015-11-02 MED ORDER — MIDAZOLAM HCL 2 MG/2ML IJ SOLN
INTRAMUSCULAR | Status: AC
  Filled 2015-11-02: qty 2

## 2015-11-02 MED ORDER — ONDANSETRON HCL 4 MG/2ML IJ SOLN
INTRAMUSCULAR | Status: DC | PRN
  Administered 2015-11-02: 4 mg via INTRAVENOUS

## 2015-11-02 MED ORDER — FENTANYL CITRATE (PF) 100 MCG/2ML IJ SOLN
INTRAMUSCULAR | Status: AC
  Filled 2015-11-02: qty 4

## 2015-11-02 MED ORDER — CEFAZOLIN SODIUM-DEXTROSE 2-3 GM-% IV SOLR
INTRAVENOUS | Status: AC
  Filled 2015-11-02: qty 50

## 2015-11-02 MED ORDER — HYDROMORPHONE HCL 1 MG/ML IJ SOLN
0.2500 mg | INTRAMUSCULAR | Status: DC | PRN
  Filled 2015-11-02: qty 1

## 2015-11-02 SURGICAL SUPPLY — 46 items
BLADE CLIPPER SURG (BLADE) IMPLANT
BLADE SURG 15 STRL LF DISP TIS (BLADE) ×1 IMPLANT
BLADE SURG 15 STRL SS (BLADE) ×3
BNDG GAUZE ELAST 4 BULKY (GAUZE/BANDAGES/DRESSINGS) ×3 IMPLANT
CANISTER SUCTION 1200CC (MISCELLANEOUS) IMPLANT
CANISTER SUCTION 2500CC (MISCELLANEOUS) IMPLANT
CLEANER CAUTERY TIP 5X5 PAD (MISCELLANEOUS) IMPLANT
CLOTH BEACON ORANGE TIMEOUT ST (SAFETY) ×3 IMPLANT
COVER BACK TABLE 60X90IN (DRAPES) ×3 IMPLANT
COVER MAYO STAND STRL (DRAPES) ×3 IMPLANT
DISSECTOR ROUND CHERRY 3/8 STR (MISCELLANEOUS) IMPLANT
DRAIN PENROSE 18X1/4 LTX STRL (WOUND CARE) ×3 IMPLANT
DRAPE PED LAPAROTOMY (DRAPES) ×3 IMPLANT
ELECT NDL TIP 2.8 STRL (NEEDLE) IMPLANT
ELECT NEEDLE TIP 2.8 STRL (NEEDLE) IMPLANT
ELECT REM PT RETURN 9FT ADLT (ELECTROSURGICAL)
ELECTRODE REM PT RTRN 9FT ADLT (ELECTROSURGICAL) IMPLANT
GLOVE BIO SURGEON STRL SZ8 (GLOVE) ×3 IMPLANT
GLOVE INDICATOR 7.5 STRL GRN (GLOVE) ×2 IMPLANT
GLOVE SURG SS PI 7.5 STRL IVOR (GLOVE) ×2 IMPLANT
GOWN STRL REUS W/ TWL LRG LVL3 (GOWN DISPOSABLE) ×1 IMPLANT
GOWN STRL REUS W/ TWL XL LVL3 (GOWN DISPOSABLE) ×1 IMPLANT
GOWN STRL REUS W/TWL LRG LVL3 (GOWN DISPOSABLE) ×3
GOWN STRL REUS W/TWL XL LVL3 (GOWN DISPOSABLE) ×3
KIT ROOM TURNOVER WOR (KITS) ×3 IMPLANT
NEEDLE HYPO 22GX1.5 SAFETY (NEEDLE) IMPLANT
NS IRRIG 500ML POUR BTL (IV SOLUTION) ×3 IMPLANT
PACK BASIN DAY SURGERY FS (CUSTOM PROCEDURE TRAY) ×3 IMPLANT
PAD CLEANER CAUTERY TIP 5X5 (MISCELLANEOUS)
PENCIL BUTTON HOLSTER BLD 10FT (ELECTRODE) ×3 IMPLANT
SPONGE GAUZE 4X4 12PLY STER LF (GAUZE/BANDAGES/DRESSINGS) ×2 IMPLANT
SUPPORT SCROTAL LG STRP (MISCELLANEOUS) ×2 IMPLANT
SUPPORTER ATHLETIC LG (MISCELLANEOUS) ×1
SUT CHROMIC 2 0 SH (SUTURE) ×4 IMPLANT
SUT CHROMIC 3 0 SH 27 (SUTURE) IMPLANT
SUT CHROMIC 4 0 SH 27 (SUTURE) IMPLANT
SUT MNCRL AB 4-0 PS2 18 (SUTURE) ×3 IMPLANT
SYRINGE CONTROL L 12CC (SYRINGE) IMPLANT
SYRINGE CONTROL LL 12CC (SYRINGE) IMPLANT
TOWEL NATURAL 6PK STERILE (DISPOSABLE) ×6 IMPLANT
TOWEL OR 17X24 6PK STRL BLUE (TOWEL DISPOSABLE) ×6 IMPLANT
TRAY DSU PREP LF (CUSTOM PROCEDURE TRAY) ×3 IMPLANT
TUBE CONNECTING 12'X1/4 (SUCTIONS) ×1
TUBE CONNECTING 12X1/4 (SUCTIONS) ×2 IMPLANT
WATER STERILE IRR 500ML POUR (IV SOLUTION) ×3 IMPLANT
YANKAUER SUCT BULB TIP NO VENT (SUCTIONS) ×3 IMPLANT

## 2015-11-02 NOTE — Anesthesia Preprocedure Evaluation (Signed)
Anesthesia Evaluation  Patient identified by MRN, date of birth, ID band Patient awake    Airway Mallampati: II  TM Distance: >3 FB Neck ROM: Full    Dental   Pulmonary neg pulmonary ROS,    breath sounds clear to auscultation       Cardiovascular  Rhythm:Regular Rate:Normal  History noted. CE   Neuro/Psych    GI/Hepatic History noted. CE   Endo/Other  negative endocrine ROS  Renal/GU negative Renal ROS     Musculoskeletal   Abdominal   Peds  Hematology   Anesthesia Other Findings   Reproductive/Obstetrics                             Anesthesia Physical Anesthesia Plan  ASA: II  Anesthesia Plan: General   Post-op Pain Management:    Induction: Intravenous  Airway Management Planned: LMA  Additional Equipment:   Intra-op Plan:   Post-operative Plan: Extubation in OR  Informed Consent: I have reviewed the patients History and Physical, chart, labs and discussed the procedure including the risks, benefits and alternatives for the proposed anesthesia with the patient or authorized representative who has indicated his/her understanding and acceptance.   Dental advisory given  Plan Discussed with: CRNA and Anesthesiologist  Anesthesia Plan Comments:         Anesthesia Quick Evaluation

## 2015-11-02 NOTE — Transfer of Care (Signed)
Immediate Anesthesia Transfer of Care Note  Patient: [name]  Procedure(s) Performed: Procedure(s) (LRB): HYDROCELECTOMY ADULT (Left)  Patient Location: PACU  Anesthesia Type: General  Level of Consciousness: awake, oriented, sedated and patient cooperative  Airway & Oxygen Therapy: Patient Spontanous Breathing and Patient connected to face mask oxygen  Post-op Assessment: Report given to PACU RN and Post -op Vital signs reviewed and stable  Post vital signs: Reviewed and stable  Complications: No apparent anesthesia complications

## 2015-11-02 NOTE — Anesthesia Procedure Notes (Signed)
Procedure Name: LMA Insertion Date/Time: 11/02/2015 7:39 AM Performed by: Denna Haggard D Pre-anesthesia Checklist: Patient identified, Emergency Drugs available, Suction available and Patient being monitored Patient Re-evaluated:Patient Re-evaluated prior to inductionOxygen Delivery Method: Circle System Utilized Preoxygenation: Pre-oxygenation with 100% oxygen Intubation Type: IV induction Ventilation: Mask ventilation without difficulty LMA: LMA inserted LMA Size: 4.0 Number of attempts: 1 Airway Equipment and Method: Bite block Placement Confirmation: positive ETCO2 Tube secured with: Tape Dental Injury: Teeth and Oropharynx as per pre-operative assessment

## 2015-11-02 NOTE — Addendum Note (Signed)
Addendum  created 11/02/15 [id number] by Suan Halter, CRNA   Modules edited: Charges VN

## 2015-11-02 NOTE — Anesthesia Postprocedure Evaluation (Signed)
Anesthesia Post Note  Patient: [name]  Procedure(s) Performed: Procedure(s) (LRB): HYDROCELECTOMY ADULT (Left)  Patient location during evaluation: PACU Level of consciousness: awake Pain management: pain level controlled Vital Signs Assessment: post-procedure vital signs reviewed and stable Respiratory status: spontaneous breathing Cardiovascular status: stable Anesthetic complications: no    Last Vitals:  Filed Vitals:   11/02/15 0823 11/02/15 0830  BP: 131/85 121/82  Pulse: 96 92  Temp: 36.4 C   Resp: 12 15    Last Pain: There were no vitals filed for this visit.               EDWARDS,Ariq Khamis

## 2015-11-02 NOTE — Discharge Instructions (Signed)
Scrotal surgery postoperative instructions  Wound:  In most cases your incision will have absorbable sutures that will dissolve within the first 2-3 weeks. Some will fall out even earlier. Expect some redness as the sutures dissolve but this should occur only around the sutures. If there is generalized redness, especially with increasing pain or swelling, let us know. The scrotum will very likely get "black and blue" as the blood in the tissues spread. Sometimes the whole scrotum will turn colors. The black and blue is followed by a yellow and brown color. In time, all the discoloration will go away. In some cases some firm swelling in the area of the testicle may persist for up to 4-6 weeks after the surgery and is considered normal in most cases.  Drain:  If the surgeon placed a drain through the bottom part of your scrotum, it is held in with a small stitch. When instructed, cut the small stitch and slide to drain out. Once the drain has been removed, a small hole made drain out for another day or 2. If so, keep a clean washcloth underneath your supportive undergarment, or sterile gauze. Until the hole seals up, all bathing should be in the shower, and not in the bathtub. OK to remove drain next Wed or Thur Diet:  You may return to your normal diet within 24 hours following your surgery. You may note some mild nausea and possibly vomiting the first 6-8 hours following surgery. This is usually due to the side effects of anesthesia, and will disappear quite soon. I would suggest clear liquids and a very light meal the first evening following your surgery.  Activity:  Your physical activity should be restricted the first 48 hours. During that time you should remain relatively inactive, moving about only when necessary. During the first 7-10 days following surgery you should avoid lifting any heavy objects (anything greater than 15 pounds), and avoid strenuous exercise. If you work, ask [place]  specifically about your restrictions, both for work and home. We will write a note to your employer if needed.  You should plan to wear a tight pair of jockey shorts or an athletic supporter for the first 4-5 days, even to sleep. This will keep the scrotum immobilized to some degree and keep the swelling down. You may find it more comfortable to wear a support longer.  Ice packs should be placed on and off over the scrotum for the first 48 hours. Frozen peas or corn in a ZipLock bag can be frozen, used and re-frozen. Fifteen minutes on and 15 minutes off is a reasonable schedule. The ice is a good pain reliever and keeps the swelling down.  Hygiene:  You may shower 48 hours after your surgery. Tub bathing should be restricted until the seventh day.  Medication:  You will be sent home with some type of pain medication. In many cases you will be sent home with a narcotic pain pill (Vicodin or Tylox). If the pain is not too bad, you may take either Tylenol (acetaminophen) or Advil (ibuprofen) which contain no narcotic agents, and might be tolerated a little better, with fewer side effects. If the pain medication you are sent home with does not control the pain, you will have to let us know. Some narcotic pain medications cannot be given or refilled by a phone call to a pharmacy.  Problems you should report to [place]:   Fever of 101.0 degrees Fahrenheit or greater.  Moderate or severe swelling under  the skin incision or involving the scrotum.  Drug reaction such as hives, a rash, nausea or vomiting.   Post Anesthesia Home Care Instructions  Activity: Get plenty of rest for the remainder of the day. A responsible adult should stay with you for 24 hours following the procedure.  For the next 24 hours, DO NOT: -Drive a car -Paediatric nurse -Drink alcoholic beverages -Take any medication unless instructed by your physician -Make any legal decisions or sign important papers.  Meals: Start  with liquid foods such as gelatin or soup. Progress to regular foods as tolerated. Avoid greasy, spicy, heavy foods. If nausea and/or vomiting occur, drink only clear liquids until the nausea and/or vomiting subsides. Call your physician if vomiting continues.  Special Instructions/Symptoms: Your throat may feel dry or sore from the anesthesia or the breathing tube placed in your throat during surgery. If this causes discomfort, gargle with warm salt water. The discomfort should disappear within 24 hours.  If you had a scopolamine patch placed behind your ear for the management of post- operative nausea and/or vomiting:  1. The medication in the patch is effective for 72 hours, after which it should be removed.  Wrap patch in a tissue and discard in the trash. Wash hands thoroughly with soap and water. 2. You may remove the patch earlier than 72 hours if you experience unpleasant side effects which may include dry mouth, dizziness or visual disturbances. 3. Avoid touching the patch. Wash your hands with soap and water after contact with the patch.

## 2015-11-02 NOTE — Op Note (Signed)
Preoperative diag left nosis: Left hydrocele   Postoperative diagnosis: Same   Procedure: Left hydrocele repair   Surgeon: Lillette Boxer. Dequandre Cordova, M.D.   Anesthesia: Gen.   Indications: Patient presented with scrotal swelling. A hydrocele was confirmed with scrotal ultrasonography. The patient was symptomatic from his hydrocele and requested surgical intervention. He appeared to understand the risks benefits potential complications of this procedure.   Procedure: The patient was properly identified and marked in the holding room. He received preoperative IV antibiotics. He was then taken to the operating room where general anesthetic was administered using the LMA. The scrotum was then prepped and draped in the usual manner. Appropriate surgical timeout was performed. An incision was made in the median raphae of the scrotum. The hydrocele was encountered which was freed from the scrotal wall and then the testis and hydrocele sac were delivered from the incision. The hydrocele sac was then incised anteriorly and a large amount of fluid was obtained. The testis was carefully inspected and no other pathology was appreciated. The hydrocele sac was moderate in thickness.  The sac was then plicated posteriorly with a running 2 chromic suture. The spermatic cord block was then performed with quarter percent Marcaine. The testis was returned to the hemiscrotum taking great care to make sure that there was no twisting of the spermatic cord. Inspection of the inside of the scrotum revealed no significant bleeding. A quarter-inch Penrose was brought through the lower aspect of the scrotum into the affected hemiscrotum and sutured to the skin. The scrotal incision was then closed anatomically, first with a running 3-0 chromic reapproximating the dartos fascia, and then a 4-0 Monocryl used to close the skin in a simple running fashion. Prior to skin closure, 10 mL of Marcaine was used to infiltrate the skin. A fluff  dressing and jockstrap was then placed Sponge and needle counts were correct. No obvious complications occurred and the patient was brought to recovery room in stable condition.

## 2015-11-03 ENCOUNTER — Encounter (HOSPITAL_BASED_OUTPATIENT_CLINIC_OR_DEPARTMENT_OTHER): Payer: Self-pay | Admitting: Urology

## 2015-11-19 HISTORY — PX: POLYPECTOMY: SHX149

## 2015-11-19 HISTORY — PX: COLONOSCOPY: SHX174

## 2015-12-26 ENCOUNTER — Telehealth: Payer: Self-pay | Admitting: Internal Medicine

## 2015-12-26 NOTE — Telephone Encounter (Signed)
Patient left message on VM 12/26/2015 to reschedule his CPE scheduled for 2/15. CAlled patient to reschedule but no answer. Left VM informing patient that the 2/15 appt had been cancelled and he could call the office to reschedule.

## 2016-01-02 ENCOUNTER — Encounter: Payer: Self-pay | Admitting: Behavioral Health

## 2016-01-02 ENCOUNTER — Telehealth: Payer: Self-pay | Admitting: Behavioral Health

## 2016-01-02 NOTE — Telephone Encounter (Signed)
Pre-Visit Call completed with patient and chart updated.   Pre-Visit Info documented in Specialty Comments under [employer].    

## 2016-01-03 ENCOUNTER — Ambulatory Visit (INDEPENDENT_AMBULATORY_CARE_PROVIDER_SITE_OTHER): Payer: BC Managed Care – PPO | Admitting: Internal Medicine

## 2016-01-03 ENCOUNTER — Encounter: Payer: Self-pay | Admitting: Internal Medicine

## 2016-01-03 ENCOUNTER — Encounter: Payer: BC Managed Care – PPO | Admitting: Internal Medicine

## 2016-01-03 VITALS — BP 124/80 | HR 94 | Temp 98.2°F | Ht 73.0 in | Wt 189.4 lb

## 2016-01-03 DIAGNOSIS — Z23 Encounter for immunization: Secondary | ICD-10-CM

## 2016-01-03 DIAGNOSIS — Z09 Encounter for follow-up examination after completed treatment for conditions other than malignant neoplasm: Secondary | ICD-10-CM | POA: Insufficient documentation

## 2016-01-03 DIAGNOSIS — Z0000 Encounter for general adult medical examination without abnormal findings: Secondary | ICD-10-CM

## 2016-01-03 DIAGNOSIS — Z114 Encounter for screening for human immunodeficiency virus [HIV]: Secondary | ICD-10-CM

## 2016-01-03 LAB — LIPID PANEL
Cholesterol: 231 mg/dL — ABNORMAL HIGH (ref 0–200)
HDL: 76.8 mg/dL (ref >39.00)
LDL Cholesterol: 137 mg/dL — ABNORMAL HIGH (ref 0–99)
NonHDL: 154.64
Total CHOL/HDL Ratio: 3
Triglycerides: 86 mg/dL (ref 0.0–149.0)
VLDL: 17.2 mg/dL (ref 0.0–40.0)

## 2016-01-03 LAB — BASIC METABOLIC PANEL
BUN: 20 mg/dL (ref 6–23)
CALCIUM: 9.5 mg/dL (ref 8.4–10.5)
CO2: 27 mEq/L (ref 19–32)
CREATININE: 1.06 mg/dL (ref 0.40–1.50)
Chloride: 101 mEq/L (ref 96–112)
GFR: 75.62 mL/min (ref >60.00)
Glucose, Bld: 110 mg/dL — ABNORMAL HIGH (ref 70–99)
Potassium: 3.9 mEq/L (ref 3.5–5.1)
SODIUM: 135 meq/L (ref 135–145)

## 2016-01-03 LAB — TSH: TSH: 3.72 u[IU]/mL (ref 0.35–4.50)

## 2016-01-03 LAB — HEMOGLOBIN A1C: Hgb A1c MFr Bld: 5.5 % (ref 4.6–6.5)

## 2016-01-03 NOTE — Assessment & Plan Note (Addendum)
Td 2010; zostavax --12-2015; Flu shot discussed , declined  [facility]--05/2013--Dr Perry--multiple adenomas--next due 2017 , will wait for GI letter  DRE and PSA normal 2015, per new guidelines, no check neccessary, we agreed on not checking today but will discuss in 1 year Diet: Usually healthy Exercise: Has been unable to exercise much lately, moving to a new house, plans to restart exercise as soon as possible. Labs: BMP, A1c, FLP, TSH, HIV

## 2016-01-03 NOTE — Assessment & Plan Note (Signed)
Snoring: Patient thinks related to deviated septum, so far has declined surgery. No symptoms of OSA. Aorta, palpable: Chart review show a negative CT 2014. Observation Heart murmur: No murmur appreciated today. BCC: Sees Dr. Allyson Sabal regularly RTC one year

## 2016-01-03 NOTE — Progress Notes (Signed)
Pre visit review using our clinic review tool, if applicable. No additional management support is needed unless otherwise documented below in the visit note. 

## 2016-01-03 NOTE — Progress Notes (Signed)
Subjective:    Patient ID: [name], male    DOB: 09-21-1956, 60 y.o.   MRN: [id number]  DOS:  01/03/2016 Type of visit - description : cpx Interval history: No major concerns, feeling well   Review of Systems Constitutional: No fever. No chills. Has gained some weight, admits to lack of exercise lately. No unusual sweats  HEENT: No dental problems, no ear discharge, no facial swelling, no voice changes. No eye discharge, no eye  redness. Occasionally has light sensitivity when driving at night, no problems during the daytime   Respiratory: No wheezing , no  difficulty breathing. No cough , no mucus production. Continue snoring but denies feeling sleepy or fatigue.  Cardiovascular: No CP, no leg swelling , no  Palpitations  GI: no nausea, no vomiting, no diarrhea , no  abdominal pain.  No blood in the stools. No dysphagia, no odynophagia    Endocrine: No polyphagia, no polyuria , no polydipsia  GU: No dysuria, gross hematuria, difficulty urinating. No urinary urgency, no frequency.  Musculoskeletal: No joint swellings or unusual aches or pains  Skin: No change in the color of the skin, palor , no  Rash  Allergic, immunologic: No environmental allergies , no  food allergies  Neurological: No dizziness no  syncope. No headaches. No diplopia, no slurred, no slurred speech, no motor deficits, no facial  Numbness  Hematological: No enlarged lymph nodes, no easy bruising , no unusual bleedings  Psychiatry: No suicidal ideas, no hallucinations, no beavior problems, no confusion.  No unusual/severe anxiety, no depression   Past Medical History  Diagnosis Date  . ADD (attention deficit disorder)     adult- per psych  . Insomnia     per psych  . Heart murmur     Echo 2003 "thickened and somehow myomateous MV, mild MR"  . Fall 4-10    fx left hip and wrist  . Diverticulitis     Perforated sigmoid diverticulitis   . Adenomatous colon polyp   . Hydrocele 2016    Large,  symptomatic left hydrocele, planned hydrocelectomy in 10/2015 with Dr. Diona Fanti  . Cancer ([facility])     basal cell skin cancer   . Wears glasses     Past Surgical History  Procedure Laterality Date  . Nose biopsy  2011    Dr Allyson Sabal, no cancer  . Hip surgery Left 2011    ORIF femur  . Wrist surgery  2011    left  . Laparoscopic sigmoid colectomy N/A 02/11/2013    Procedure: LAPAROSCOPIC SIGMOID COLECTOMY WITH MOBILIZATION SPLENIC FLEXURE;  Surgeon: Madilyn Hook, DO;  Location: [facility];  Service: General;  Laterality: N/A;  . Hydrocele excision Left 11/02/2015    Procedure: HYDROCELECTOMY ADULT;  Surgeon: Franchot Gallo, MD;  Location: [facility];  Service: Urology;  Laterality: Left;    Social History   Social History  . Marital Status: Married    Spouse Name: [name]  . Number of Children: 3  . Years of Education: N/A   Occupational History  . [employer]  [employer]   Social History Main Topics  . Smoking status: Never Smoker   . Smokeless tobacco: Never Used  . Alcohol Use: 3.6 oz/week    6 Glasses of wine per week     Comment: MODERATION  . Drug Use: No  . Sexual Activity: Not on file   Other Topics Concern  . Not on file   Social History Narrative  3 adult children, 2 g-children   Household-- pt and wife           Family History  Problem Relation Age of Onset  . Breast cancer Mother   . Lung cancer Father     smoker  . Diabetes Neg Hx   . Heart failure Mother   . Hypertension Neg Hx   . Colon cancer Neg Hx   . Prostate cancer Neg Hx   . Heart attack Neg Hx       Medication List       This list is accurate as of: 01/03/16 12:26 PM.  Always use your most recent med list.               amphetamine-dextroamphetamine 30 MG tablet  Commonly known as:  ADDERALL  Take 30 mg by mouth daily before breakfast.     azelastine 0.1 % nasal spray  Commonly known as:  ASTELIN  Place 2 sprays into both nostrils at bedtime as  needed for rhinitis. Use in each nostril as directed     CENTRUM ULTRA MENS Tabs  Take by mouth. Three times a week--Monday, Wednesday, Friday     traZODone 100 MG tablet  Commonly known as:  DESYREL  Take 50 mg by mouth at bedtime.           Objective:   Physical Exam BP 124/80 mmHg  Pulse 94  Temp(Src) 98.2 F (36.8 C) (Oral)  Ht 6\' 1"  (1.854 m)  Wt 189 lb 6 oz (85.9 kg)  BMI 24.99 kg/m2  SpO2 98% General:   Well developed, well nourished . NAD.  HEENT:  Normocephalic . Face symmetric, atraumatic. Neck: No thyromegaly Lungs:  CTA B Normal respiratory effort, no intercostal retractions, no accessory muscle use. Heart: RRR,  no murmur.  no pretibial edema bilaterally  Abdomen:  Not distended, soft, non-tender. Aorta palpable at the upper abdomen without bruit or tenderness Skin: Not pale. Not jaundice Neurologic:  alert & oriented X3.  Speech normal, gait appropriate for age and unassisted Psych--  Cognition and judgment appear intact.  Cooperative with normal attention span and concentration.  Behavior appropriate. No anxious or depressed appearing.       Assessment & Plan:   Assessment ADD,  Insomnia per psychiatry Snoring  palpable aorta: CT 2014 no AAA H/o diverticulitis, perforated, sigmoid colectomy 2014 H/o Hydrocele 2016, large, symptomatic, saw urology, excised  H/o BCC, sees derm, sees Dr Allyson Sabal as off 12-2015 H/o  heart murmur, echo 2013 --thick MV, mild MR  Plan: Snoring: Patient thinks related to deviated septum, so far has declined surgery. No symptoms of OSA. Aorta, palpable: Chart review show a negative CT 2014. Observation Heart murmur: No murmur appreciated today. BCC: Sees Dr. Allyson Sabal regularly RTC one year

## 2016-01-03 NOTE — Patient Instructions (Signed)
GO TO THE LAB : Get the blood work    GO TO THE FRONT DESK Schedule a complete physical exam to be done in 1 year Please be fasting

## 2016-01-04 LAB — HIV ANTIBODY (ROUTINE TESTING W REFLEX): HIV 1&2 Ab, 4th Generation: NONREACTIVE

## 2016-03-14 ENCOUNTER — Telehealth: Payer: Self-pay | Admitting: Internal Medicine

## 2016-03-14 DIAGNOSIS — Z1211 Encounter for screening for malignant neoplasm of colon: Secondary | ICD-10-CM

## 2016-03-14 DIAGNOSIS — H9190 Unspecified hearing loss, unspecified ear: Secondary | ICD-10-CM

## 2016-03-14 NOTE — Telephone Encounter (Signed)
Ref have been placed.    KP

## 2016-03-14 NOTE — Telephone Encounter (Signed)
Please advise on the below request.     KP

## 2016-03-14 NOTE — Telephone Encounter (Signed)
He is due for a colonoscopy this year. Please arrange a GI and audiologist referral

## 2016-03-14 NOTE — Telephone Encounter (Signed)
Caller name: Self  Can be reached: [phone]    Reason for call: Patient want to know if he is due for a Colonoscopy this year and if so, would like to have it scheduled prior to June because of insurance changing. Also, would like referral to have his Hearing tested.

## 2016-03-19 ENCOUNTER — Encounter: Payer: Self-pay | Admitting: Internal Medicine

## 2016-03-26 ENCOUNTER — Other Ambulatory Visit: Payer: Self-pay | Admitting: Internal Medicine

## 2016-05-14 ENCOUNTER — Encounter: Payer: Self-pay | Admitting: Internal Medicine

## 2016-05-14 ENCOUNTER — Ambulatory Visit (AMBULATORY_SURGERY_CENTER): Payer: Self-pay | Admitting: *Deleted

## 2016-05-14 VITALS — Ht 73.0 in | Wt 189.0 lb

## 2016-05-14 DIAGNOSIS — Z86010 Personal history of colonic polyps: Secondary | ICD-10-CM

## 2016-05-14 MED ORDER — NA SULFATE-K SULFATE-MG SULF 17.5-3.13-1.6 GM/177ML PO SOLN: 1.0000 | Freq: Once | ORAL | Status: DC

## 2016-05-14 NOTE — Progress Notes (Signed)
No egg or soy allergy known to patient  No issues with past sedation with any surgeries  or procedures, no intubation problems  No diet pills per patient No home 02 use per patient  No blood thinners per patient  Pt denies issues with constipation  emmi declined

## 2016-05-28 ENCOUNTER — Ambulatory Visit (AMBULATORY_SURGERY_CENTER): Payer: BC Managed Care – PPO | Admitting: Internal Medicine

## 2016-05-28 ENCOUNTER — Encounter: Payer: Self-pay | Admitting: Internal Medicine

## 2016-05-28 VITALS — BP 111/63 | HR 68 | Temp 97.8°F | Resp 10 | Ht 73.0 in | Wt 189.0 lb

## 2016-05-28 DIAGNOSIS — D123 Benign neoplasm of transverse colon: Secondary | ICD-10-CM | POA: Diagnosis not present

## 2016-05-28 DIAGNOSIS — Z86010 Personal history of colonic polyps: Secondary | ICD-10-CM | POA: Diagnosis present

## 2016-05-28 DIAGNOSIS — D124 Benign neoplasm of descending colon: Secondary | ICD-10-CM | POA: Diagnosis not present

## 2016-05-28 MED ORDER — SODIUM CHLORIDE 0.9 % IV SOLN: 500.0000 mL | INTRAVENOUS | Status: DC

## 2016-05-28 NOTE — Op Note (Signed)
[place] Patient Name: Kahleb Marik Procedure Date: 05/28/2016 9:28 AM MRN: [id number] Endoscopist: Docia Chuck. Henrene Pastor , MD Age: 60 Referring MD:  Date of Birth: 15-Oct-1956 Gender: Male Account #: [id number] Procedure:                Colonoscopy, with cold snare polypectomy x 2 Indications:              Surveillance: Personal history of colonic polyps                            (unknown histology) on last colonoscopy 3 years                            ago. Index exam 2011 (multiple adenomas including                            than 10 mm tubulovillous adenoma), follow-up July                            2014 with 3 polyps. Prior sigmoid colectomy for                            diverticular disease Medicines:                Monitored Anesthesia Care Procedure:                Pre-Anesthesia Assessment:                           - Prior to the procedure, a History and Physical                            was performed, and patient medications and                            allergies were reviewed. The patient's tolerance of                            previous anesthesia was also reviewed. The risks                            and benefits of the procedure and the sedation                            options and risks were discussed with the patient.                            All questions were answered, and informed consent                            was obtained. Prior Anticoagulants: The patient has                            taken no previous anticoagulant or antiplatelet  agents. ASA Grade Assessment: II - A patient with                            mild systemic disease. After reviewing the risks                            and benefits, the patient was deemed in                            satisfactory condition to undergo the procedure.                           After obtaining informed consent, the colonoscope                            was passed under  direct vision. Throughout the                            procedure, the patient's blood pressure, pulse, and                            oxygen saturations were monitored continuously. The                            Model CF-HQ190L [id number]) scope was introduced                            through the anus and advanced to the the cecum,                            identified by appendiceal orifice and ileocecal                            valve. The ileocecal valve, appendiceal orifice,                            and rectum were photographed. The quality of the                            bowel preparation was excellent. The colonoscopy                            was performed without difficulty. The patient                            tolerated the procedure well. The bowel preparation                            used was SUPREP. Scope In: 9:41:35 AM Scope Out: 9:52:14 AM Scope Withdrawal Time: 0 hours 9 minutes 25 seconds  Total Procedure Duration: 0 hours 10 minutes 39 seconds  Findings:                 Two polyps were found in the descending colon and  transverse colon. The polyps were 3 to 4 mm in                            size. These polyps were removed with a cold snare.                            Resection and retrieval were complete.                           Multiple diverticula were found in the left colon.                           Internal hemorrhoids were found during retroflexion.                           The exam was otherwise without abnormality on                            direct and retroflexion views. Complications:            No immediate complications. Estimated blood loss:                            None. Estimated Blood Loss:     Estimated blood loss: none. Impression:               - Two 3 to 4 mm polyps in the descending colon and                            in the transverse colon, removed with a cold snare.                             Resected and retrieved.                           - Diverticulosis in the left colon. The surgical                            anastomosis was at 20 cm.                           - Internal hemorrhoids.                           - The examination was otherwise normal on direct                            and retroflexion views. Recommendation:           - Repeat colonoscopy in 5 years for surveillance.                           - Patient has a contact number available for                            emergencies. The  signs and symptoms of potential                            delayed complications were discussed with the                            patient. Return to normal activities tomorrow.                            Written discharge instructions were provided to the                            patient.                           - Resume previous diet.                           - Continue present medications.                           - Await pathology results. Docia Chuck. Henrene Pastor, MD 05/28/2016 9:57:31 AM This report has been signed electronically.

## 2016-05-28 NOTE — Progress Notes (Signed)
A and O x3. Report to RN. Tolerated MAC anesthesia well. 

## 2016-05-28 NOTE — Patient Instructions (Signed)
YOU HAD AN ENDOSCOPIC PROCEDURE TODAY AT THE [facility]:   Refer to the procedure report that was given to you for any specific questions about what was found during the examination.  If the procedure report does not answer your questions, please call your gastroenterologist to clarify.  If you requested that your care partner not be given the details of your procedure findings, then the procedure report has been included in a sealed envelope for you to review at your convenience later.  YOU SHOULD EXPECT: Some feelings of bloating in the abdomen. Passage of more gas than usual.  Walking can help get rid of the air that was put into your GI tract during the procedure and reduce the bloating. If you had a lower endoscopy (such as a colonoscopy or flexible sigmoidoscopy) you may notice spotting of blood in your stool or on the toilet paper. If you underwent a bowel prep for your procedure, you may not have a normal bowel movement for a few days.  Please Note:  You might notice some irritation and congestion in your nose or some drainage.  This is from the oxygen used during your procedure.  There is no need for concern and it should clear up in a day or so.  SYMPTOMS TO REPORT IMMEDIATELY:   Following lower endoscopy (colonoscopy or flexible sigmoidoscopy):  Excessive amounts of blood in the stool  Significant tenderness or worsening of abdominal pains  Swelling of the abdomen that is new, acute  Fever of 100F or higher   For urgent or emergent issues, a gastroenterologist can be reached at any hour by calling [phone].   DIET: Your first meal following the procedure should be a small meal and then it is ok to progress to your normal diet. Heavy or fried foods are harder to digest and may make you feel nauseous or bloated.  Likewise, meals heavy in dairy and vegetables can increase bloating.  Drink plenty of fluids but you should avoid alcoholic beverages for 24  hours.  ACTIVITY:  You should plan to take it easy for the rest of today and you should NOT DRIVE or use heavy machinery until tomorrow (because of the sedation medicines used during the test).    FOLLOW UP: Our staff will call the number listed on your records the next business day following your procedure to check on you and address any questions or concerns that you may have regarding the information given to you following your procedure. If we do not reach you, we will leave a message.  However, if you are feeling well and you are not experiencing any problems, there is no need to return our call.  We will assume that you have returned to your regular daily activities without incident.  If any biopsies were taken you will be contacted by phone or by letter within the next 1-3 weeks.  Please call us at [phone] if you have not heard about the biopsies in 3 weeks.    SIGNATURES/CONFIDENTIALITY: You and/or your care partner have signed paperwork which will be entered into your electronic medical record.  These signatures attest to the fact that that the information above on your After Visit Summary has been reviewed and is understood.  Full responsibility of the confidentiality of this discharge information lies with you and/or your care-partner.  Polyp, diverticulosis, high fiber diet, and hemorrhoid information given. 

## 2016-05-28 NOTE — Progress Notes (Signed)
Called to room to assist during endoscopic procedure.  Patient ID and intended procedure confirmed with present staff. Received instructions for my participation in the procedure from the performing physician.  

## 2016-05-29 ENCOUNTER — Telehealth: Payer: Self-pay | Admitting: *Deleted

## 2016-05-29 NOTE — Telephone Encounter (Signed)
  Follow up Call-  Call back number 05/28/2016  Post procedure Call Back phone  # [phone]  Permission to leave phone message Yes     No answer, left message.

## 2016-05-30 ENCOUNTER — Ambulatory Visit: Payer: BC Managed Care – PPO | Attending: Internal Medicine | Admitting: Audiology

## 2016-05-30 DIAGNOSIS — H93293 Other abnormal auditory perceptions, bilateral: Secondary | ICD-10-CM | POA: Diagnosis present

## 2016-05-30 DIAGNOSIS — R94128 Abnormal results of other function studies of ear and other special senses: Secondary | ICD-10-CM

## 2016-05-30 DIAGNOSIS — Z01118 Encounter for examination of ears and hearing with other abnormal findings: Secondary | ICD-10-CM | POA: Diagnosis present

## 2016-05-30 DIAGNOSIS — H906 Mixed conductive and sensorineural hearing loss, bilateral: Secondary | ICD-10-CM | POA: Insufficient documentation

## 2016-05-31 NOTE — Procedures (Signed)
[facility]  [place], [place] [address]  [phone]   Audiological Evaluation  Patient Name: [name]    Status: Outpatient   DOB: 1956-01-01    Diagnosis: Decreased hearing MRN: [id number] Date:  05/30/2016    Referent: Kathlene November, MD  History: [name] was seen for an audiological evaluation for concerns that hearing has become poorer "over the past few years'. [name] reports difficult hearing in a "crowded room or with loud background sounds".  There is a significant family history of hearing loss of his "father and grandfather" - most recently his daughter in her 27's was identified with enough hearing loss that "hearing aids were recommended".  [name] reports occasional tinnitus, but none now.  He also reports having significant seasonal allergies. Medication: Adderall, Trazadone, nasal spray.   Evaluation: Conventional pure tone audiometry from 250Hz  - 8000Hz  with using insert earphones. Hearing thresholds are fairly symmetrical ranging from 30-45 dBHL from 250Hz  - 3000Hz  and 45-65 dBHL from 4000Hz  - 8000Hz  bilaterally.Reliability is good Speech reception levels (repeating words near threshold) using recorded spondee word lists are 35 dBHL bilaterally. Word recognition (at comfortably loud volumes) using recorded word lists are 100% at 70 dBHL, in quiet in each ear.  Word recognition in minimal background noise:  +5 dBHL  Right ear: 64%                              Left ear:  76%  Tympanometry (middle ear function) shows normal middle ear volume, pressure and compliance bilaterally (Type A). Ipsilateral acoustic reflexes are range from present to elevated bilaterally and is consistent with the degree of hearing loss.    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 Although the hearing loss is primarily  sensorineural there is a low frequency mixed component on the left side and high frequency mixed component on the right side that may benefit from an ENT referral.   [name]  has excellent word recognition in quiet; however, in minimal background noise his word recognition drops to poor on the right and fair on the left.  his amount of hearing loss will adversely affect speech communication at normal conversational speech levels. A hearing aid evaluation is strongly recommended.   [name] states that he wants to consult with Kathlene November, MD and would like to be referred to an ENT for medical clearance.    RECOMMENDATIONS: 1.   Consider further evaluation of the hearing loss to obtain medical clearance by an Ear, Nose and Throat physician because of the mixed hearing loss at some frequencies.  2.   A hearing aid evaluation after medical clearance to help with hearing. 3. Strategies that help improve hearing include: A) Face the speaker directly. Optimal is having the speakers face well - lit.  Unless amplified, being within 3-6 feet of the speaker will enhance word recognition. B) Avoid having the speaker back-lit as this will minimize the ability to use cues from lip-reading, facial expression and gestures. C)  Word recognition is poorer in background noise. For optimal word recognition, turn off the TV, radio or noisy fan when engaging in conversation. In a restaurant, try to sit away from noise sources and close to the primary speaker.  D)  Ask  for topic clarification from time to time in order to remain in the conversation.  Most people don't mind repeating or clarifying a point when asked.  If needed, explain the difficulty hearing in background noise or hearing loss. 4.   Remember to use hearing protection during noisy activities such as using a weed eater, moving the lawn, shooting, etc.       Deborah L. Heide Spark Au.D., CCC-A Doctor of Audiology 05/30/2016  cc: Kathlene November, MD

## 2016-06-03 ENCOUNTER — Encounter: Payer: Self-pay | Admitting: Internal Medicine

## 2016-07-02 ENCOUNTER — Encounter: Payer: Self-pay | Admitting: Internal Medicine

## 2017-02-13 ENCOUNTER — Encounter: Payer: Self-pay | Admitting: Family Medicine

## 2017-02-13 ENCOUNTER — Ambulatory Visit (INDEPENDENT_AMBULATORY_CARE_PROVIDER_SITE_OTHER): Payer: BLUE CROSS/BLUE SHIELD | Admitting: Family Medicine

## 2017-02-13 VITALS — BP 118/80 | HR 116 | Ht 73.0 in | Wt 189.0 lb

## 2017-02-13 DIAGNOSIS — R269 Unspecified abnormalities of gait and mobility: Secondary | ICD-10-CM | POA: Insufficient documentation

## 2017-02-13 DIAGNOSIS — M2550 Pain in unspecified joint: Secondary | ICD-10-CM | POA: Diagnosis not present

## 2017-02-13 NOTE — Assessment & Plan Note (Signed)
Mild overall the patient does have the breakdown of the feet. We discussed that we can make some changes of the orthotics and we'll get him scheduled for new orthotics. We discussed over-the-counter medications could be beneficial as well. We discussed how this can help potentially his fatigue. Patient will come back and have the orthotics made in the near future.

## 2017-02-13 NOTE — Patient Instructions (Signed)
Great to see you  You should do great overall  Call insurance and see what they will cover.  Diagnosis is pes planus and abnormality of gait.  Look at [facility] and see if you can order other ones for yourself.  We will order the orthotics and call you  See me again 4 weeks after you get the orthotics.

## 2017-02-13 NOTE — Progress Notes (Signed)
[name] [place], [place] [address] Phone: [phone] Subjective:    I'm seeing this patient by the request  of:    CC: Left foot pain  URK:[id number]  [name] is a 61 y.o. male coming in with complaint of left foot pain.Patient was seen previously. Patient didn't have custom orthotics made, was doing over-the-counter natural supplementations, home exercises. Patient was doing relatively well at last follow-up year and a half ago. Patient states Overall he had been very well until the last couple months. Feels like his orthotics are not helping as much. Has numbness when he is been on the golf course or cannot make an 18 rounds without significant amount of pain on the plantar aspect of the feet bilaterally. Patient is wondering if he potentially needs new inserts.     Past Medical History:  Diagnosis Date  . ADD (attention deficit disorder)    adult- per psych  . Adenomatous colon polyp   . Allergy   . Cancer ([place])    basal cell skin cancer   . Diverticulitis    Perforated sigmoid diverticulitis   . Fall 4-10   fx left hip and wrist  . Heart murmur    Echo 2003 "thickened and somehow myomateous MV, mild MR"  . Hydrocele 2016   Large, symptomatic left hydrocele, planned hydrocelectomy in 10/2015 with Dr. Diona Fanti  . Insomnia    per psych  . Wears glasses    Past Surgical History:  Procedure Laterality Date  . COLONOSCOPY    . HIP SURGERY Left 2011   ORIF femur  . HYDROCELE EXCISION Left 11/02/2015   Procedure: HYDROCELECTOMY ADULT;  Surgeon: Franchot Gallo, MD;  Location: [facility];  Service: Urology;  Laterality: Left;  . LAPAROSCOPIC SIGMOID COLECTOMY N/A 02/11/2013   Procedure: LAPAROSCOPIC SIGMOID COLECTOMY WITH MOBILIZATION SPLENIC FLEXURE;  Surgeon: Madilyn Hook, DO;  Location: [facility];  Service: General;  Laterality: N/A;  . nose biopsy  2011   Dr Allyson Sabal, no cancer  . POLYPECTOMY    . WRIST  SURGERY  2011   left   Social History   Social History  . Marital status: Married    Spouse name: [name]  . Number of children: 3  . Years of education: N/A   Occupational History  . [employer]  [employer]   Social History Main Topics  . Smoking status: Never Smoker  . Smokeless tobacco: Never Used  . Alcohol use 3.6 oz/week    6 Glasses of wine per week     Comment: MODERATION  . Drug use: No  . Sexual activity: Not Asked   Other Topics Concern  . None   Social History Narrative   3 adult children, 2 g-children   Household-- pt and wife         No Known Allergies Family History  Problem Relation Age of Onset  . Breast cancer Mother   . Heart failure Mother   . Lung cancer Father     smoker  . Diabetes Neg Hx   . Hypertension Neg Hx   . Colon cancer Neg Hx   . Prostate cancer Neg Hx   . Heart attack Neg Hx   . Colon polyps Neg Hx   . Esophageal cancer Neg Hx   . Rectal cancer Neg Hx   . Stomach cancer Neg Hx     Past medical history, social, surgical and family history all  reviewed in electronic medical record.  No pertanent information unless stated regarding to the chief complaint.   Review of Systems:Review of systems updated and as accurate as of 02/13/17  No headache, visual changes, nausea, vomiting, diarrhea, constipation, dizziness, abdominal pain, skin rash, fevers, chills, night sweats, weight loss, swollen lymph nodes, body aches, joint swelling, muscle aches, chest pain, shortness of breath, mood changes.   Objective  Blood pressure 118/80, pulse (!) 116, height 6\' 1"  (1.854 m), weight 189 lb (85.7 kg), SpO2 96 %. Systems examined below as of 02/13/17   General: No apparent distress alert and oriented x3 mood and affect normal, dressed appropriately.  HEENT: Pupils equal, extraocular movements intact  Respiratory: Patient's speak in full sentences and does not appear short of breath  Cardiovascular: No lower extremity edema, non  tender, no erythema  Skin: Warm dry intact with no signs of infection or rash on extremities or on axial skeleton.  Abdomen: Soft nontender  Neuro: Cranial nerves II through XII are intact, neurovascularly intact in all extremities with 2+ DTRs and 2+ pulses.  Lymph: No lymphadenopathy of posterior or anterior cervical chain or axillae bilaterally.  Gait normal with good balance and coordination.  MSK:  Non tender with full range of motion and good stability and symmetric strength and tone of shoulders, elbows, wrist, hip, knee and ankles bilaterally.  Foot exam shows the patient is a over pronation of the hindfoot but severe over supination of the midfoot and forefoot. Patient does have some pes planus. Mild splaying between the first and second does have breakdown of the transverse arch bilaterally left greater than  right   Impression and Recommendations:     This case required medical decision making of moderate complexity.      Note: This dictation was prepared with Dragon dictation along with smaller phrase technology. Any transcriptional errors that result from this process are unintentional.

## 2017-02-19 NOTE — Progress Notes (Signed)
Procedure Note   Patient was fitted for a : All-around, standard, cushioned, semi-rigid orthotic. The orthotic was heated and afterward the patient patient seated position and molded The patient was positioned in subtalar neutral position and 10 degrees of ankle dorsiflexion in a weight bearing stance. After completion of molding, patient did have orthotic management The blank was ground to a stable position for weight bearing. Size: Men's size 12  Base: Carbon fiber  Additional Posting and Padding:  Right: transverse arch flat posting, one lateral 200/120, 2 medial 200/120 Left: transverse arch flat posting, one lateral 200/120, 1 medial 200/120 The patient ambulated these, and they were very comfortable.

## 2017-02-20 ENCOUNTER — Ambulatory Visit (INDEPENDENT_AMBULATORY_CARE_PROVIDER_SITE_OTHER): Payer: BLUE CROSS/BLUE SHIELD | Admitting: Family Medicine

## 2017-02-20 ENCOUNTER — Encounter: Payer: Self-pay | Admitting: Family Medicine

## 2017-02-20 DIAGNOSIS — R269 Unspecified abnormalities of gait and mobility: Secondary | ICD-10-CM | POA: Diagnosis not present

## 2017-02-20 NOTE — Assessment & Plan Note (Signed)
Orthotics place. Patient warned him slowly over the course of next several weeks. We discussed any adjustments and may be needed. Follow-up again in 2-4 weeks for further evaluation and treatment.

## 2017-03-11 ENCOUNTER — Encounter: Payer: Self-pay | Admitting: Internal Medicine

## 2017-03-11 ENCOUNTER — Ambulatory Visit (INDEPENDENT_AMBULATORY_CARE_PROVIDER_SITE_OTHER): Payer: BLUE CROSS/BLUE SHIELD | Admitting: Internal Medicine

## 2017-03-11 VITALS — BP 124/74 | HR 75 | Temp 97.5°F | Resp 14 | Ht 73.0 in | Wt 185.5 lb

## 2017-03-11 DIAGNOSIS — Z0000 Encounter for general adult medical examination without abnormal findings: Secondary | ICD-10-CM

## 2017-03-11 DIAGNOSIS — Z1159 Encounter for screening for other viral diseases: Secondary | ICD-10-CM

## 2017-03-11 DIAGNOSIS — N5089 Other specified disorders of the male genital organs: Secondary | ICD-10-CM

## 2017-03-11 DIAGNOSIS — H9193 Unspecified hearing loss, bilateral: Secondary | ICD-10-CM

## 2017-03-11 DIAGNOSIS — J342 Deviated nasal septum: Secondary | ICD-10-CM

## 2017-03-11 LAB — CBC WITH DIFFERENTIAL/PLATELET
BASOS ABS: 0 10*3/uL (ref 0.0–0.1)
Basophils Relative: 0.7 % (ref 0.0–3.0)
EOS PCT: 0.3 % (ref 0.0–5.0)
Eosinophils Absolute: 0 10*3/uL (ref 0.0–0.7)
HCT: 46.2 % (ref 39.0–52.0)
Hemoglobin: 15.6 g/dL (ref 13.0–17.0)
LYMPHS ABS: 1.6 10*3/uL (ref 0.7–4.0)
Lymphocytes Relative: 24.5 % (ref 12.0–46.0)
MCHC: 33.7 g/dL (ref 30.0–36.0)
MCV: 95.2 fl (ref 78.0–100.0)
MONO ABS: 0.5 10*3/uL (ref 0.1–1.0)
Monocytes Relative: 7.7 % (ref 3.0–12.0)
NEUTROS PCT: 66.8 % (ref 43.0–77.0)
Neutro Abs: 4.3 10*3/uL (ref 1.4–7.7)
Platelets: 246 10*3/uL (ref 150.0–400.0)
RBC: 4.85 Mil/uL (ref 4.22–5.81)
RDW: 14.3 % (ref 11.5–15.5)
WBC: 6.4 10*3/uL (ref 4.0–10.5)

## 2017-03-11 LAB — LIPID PANEL
CHOL/HDL RATIO: 2
Cholesterol: 226 mg/dL — ABNORMAL HIGH (ref 0–200)
HDL: 91.1 mg/dL (ref >39.00)
LDL Cholesterol: 117 mg/dL — ABNORMAL HIGH (ref 0–99)
NONHDL: 135.04
Triglycerides: 89 mg/dL (ref 0.0–149.0)
VLDL: 17.8 mg/dL (ref 0.0–40.0)

## 2017-03-11 LAB — COMPREHENSIVE METABOLIC PANEL
ALT: 18 U/L (ref 0–53)
AST: 22 U/L (ref 0–37)
Albumin: 4.5 g/dL (ref 3.5–5.2)
Alkaline Phosphatase: 54 U/L (ref 39–117)
BUN: 11 mg/dL (ref 6–23)
CHLORIDE: 101 meq/L (ref 96–112)
CO2: 30 meq/L (ref 19–32)
CREATININE: 0.93 mg/dL (ref 0.40–1.50)
Calcium: 9.7 mg/dL (ref 8.4–10.5)
GFR: 87.59 mL/min (ref >60.00)
Glucose, Bld: 113 mg/dL — ABNORMAL HIGH (ref 70–99)
Potassium: 4.5 mEq/L (ref 3.5–5.1)
SODIUM: 137 meq/L (ref 135–145)
Total Bilirubin: 1 mg/dL (ref 0.2–1.2)
Total Protein: 7.3 g/dL (ref 6.0–8.3)

## 2017-03-11 LAB — PSA: PSA: 2.29 ng/mL (ref 0.10–4.00)

## 2017-03-11 LAB — TSH: TSH: 4.2 u[IU]/mL (ref 0.35–4.50)

## 2017-03-11 MED ORDER — CLINDAMYCIN PHOSPHATE 1 % EX GEL: Freq: Two times a day (BID) | CUTANEOUS | 6 refills | Status: DC | PRN

## 2017-03-11 NOTE — Assessment & Plan Note (Signed)
Chronic problems: Stable HOH: Was seen by audiology few months ago, HOH confirmed, denies tinnitus, does interfere with ADLs. Refer to Dr. Elwyn Reach ENT RF clindamycin -- for a shot folliculitis prn RTC one year

## 2017-03-11 NOTE — Assessment & Plan Note (Addendum)
Td 2010; zostavax --12-2015 --CCS: *cscope 05/2013--Dr Perry--multiple adenomas *cscope again 05-2016, 5 years --Prostate ca screening : DRE neg today, check a PSA -- Diet, exercise: Doing great --Labs:  CMP, FLP, CBC, TSH, PSA, hep C

## 2017-03-11 NOTE — Progress Notes (Signed)
Pre visit review using our clinic review tool, if applicable. No additional management support is needed unless otherwise documented below in the visit note. 

## 2017-03-11 NOTE — Progress Notes (Signed)
Subjective:    Patient ID: [name], male    DOB: 12/16/1955, 61 y.o.   MRN: [id number]  DOS:  03/11/2017 Type of visit - description :  cpx Interval history: no major concerns   Wt Readings from Last 3 Encounters:  03/11/17 185 lb 8 oz (84.1 kg)  02/13/17 189 lb (85.7 kg)  05/28/16 189 lb (85.7 kg)    Review of Systems s/p an audiology evaluation, + HOH. No tinnitus, worse on the left. Needs a refill on clindamycin, he uses as needed.   Other than above, a 14 point review of systems is negative   Past Medical History:  Diagnosis Date  . ADD (attention deficit disorder)    adult- per psych  . Adenomatous colon polyp   . Allergy   . Cancer ([place] [facility])    basal cell skin cancer   . Diverticulitis    Perforated sigmoid diverticulitis   . Fall 4-10   fx left hip and wrist  . Heart murmur    Echo 2003 "thickened and somehow myomateous MV, mild MR"  . HOH (hard of hearing)   . Hydrocele 2016   Large, symptomatic left hydrocele, planned hydrocelectomy in 10/2015 with Dr. Diona Fanti  . Insomnia    per psych  . Wears glasses     Past Surgical History:  Procedure Laterality Date  . COLONOSCOPY    . HIP SURGERY Left 2011   ORIF femur  . HYDROCELE EXCISION Left 11/02/2015   Procedure: HYDROCELECTOMY ADULT;  Surgeon: Franchot Gallo, MD;  Location: [facility];  Service: Urology;  Laterality: Left;  . LAPAROSCOPIC SIGMOID COLECTOMY N/A 02/11/2013   Procedure: LAPAROSCOPIC SIGMOID COLECTOMY WITH MOBILIZATION SPLENIC FLEXURE;  Surgeon: Madilyn Hook, DO;  Location: [facility];  Service: General;  Laterality: N/A;  . nose biopsy  2011   Dr Allyson Sabal, no cancer  . POLYPECTOMY    . WRIST SURGERY  2011   left   Family History  Problem Relation Age of Onset  . Breast cancer Mother   . Heart failure Mother   . Lung cancer Father     smoker  . Diabetes Neg Hx   . Hypertension Neg Hx   . Colon cancer Neg Hx   . Prostate cancer Neg Hx   . Heart attack Neg Hx   .  Colon polyps Neg Hx   . Esophageal cancer Neg Hx   . Rectal cancer Neg Hx   . Stomach cancer Neg Hx     Social History   Social History  . Marital status: Married    Spouse name: [name]  . Number of children: 3  . Years of education: N/A   Occupational History  . [employer]  [employer]   Social History Main Topics  . Smoking status: Never Smoker  . Smokeless tobacco: Never Used  . Alcohol use 3.6 oz/week    6 Glasses of wine per week     Comment: MODERATION  . Drug use: No  . Sexual activity: Not on file   Other Topics Concern  . Not on file   Social History Narrative   3 adult children, 2 g-children   Household-- pt and wife            Allergies as of 03/11/2017   No Known Allergies     Medication List       Accurate as of 03/11/17  9:02 PM. Always use your most recent med list.  amphetamine-dextroamphetamine 30 MG tablet Commonly known as:  ADDERALL Take 30 mg by mouth daily before breakfast.   CENTRUM ULTRA MENS Tabs Take by mouth. Three times a week--Monday, Wednesday, Friday   clindamycin 1 % gel Commonly known as:  CLINDAGEL Apply topically 2 (two) times daily as needed.   traZODone 100 MG tablet Commonly known as:  DESYREL Take 50 mg by mouth at bedtime.          Objective:   Physical Exam BP 124/74 (BP Location: Left Arm, Patient Position: Sitting, Cuff Size: Normal)   Pulse 75   Temp 97.5 F (36.4 C) (Oral)   Resp 14   Ht 6\' 1"  (1.854 m)   Wt 185 lb 8 oz (84.1 kg)   SpO2 97%   BMI 24.47 kg/m   General:   Well developed, well nourished . NAD.  Neck: No  thyromegaly  HEENT:  Normocephalic . Face symmetric, atraumatic Lungs:  CTA B Normal respiratory effort, no intercostal retractions, no accessory muscle use. Heart: RRR,  no murmur.  No pretibial edema bilaterally  Abdomen:  Not distended, soft, non-tender. No rebound or rigidity.   Skin: Exposed areas without rash. Not pale. Not jaundice Rectal:    External abnormalities: none. Normal sphincter tone. No rectal masses or tenderness.  Stools not found Prostate: Prostate gland firm and smooth, no enlargement, nodularity, tenderness, mass, asymmetry or induration.  Neurologic:  alert & oriented X3.  Speech normal, gait appropriate for age and unassisted Strength symmetric and appropriate for age.  Psych: Cognition and judgment appear intact.  Cooperative with normal attention span and concentration.  Behavior appropriate. No anxious or depressed appearing.    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 Refer to Dr. Elwyn Reach ENT RF clindamycin -- for a shot folliculitis prn RTC one year

## 2017-03-11 NOTE — Patient Instructions (Signed)
GO TO THE LAB : Get the blood work     GO TO THE FRONT DESK Schedule your next appointment for a  physical exam in one year  

## 2017-03-12 LAB — HEPATITIS C ANTIBODY: HCV AB: NEGATIVE

## 2017-07-03 ENCOUNTER — Ambulatory Visit (INDEPENDENT_AMBULATORY_CARE_PROVIDER_SITE_OTHER): Payer: BLUE CROSS/BLUE SHIELD | Admitting: Internal Medicine

## 2017-07-03 ENCOUNTER — Encounter: Payer: Self-pay | Admitting: Internal Medicine

## 2017-07-03 VITALS — BP 118/68 | HR 86 | Temp 98.3°F | Resp 14 | Ht 73.0 in | Wt 184.0 lb

## 2017-07-03 DIAGNOSIS — J309 Allergic rhinitis, unspecified: Secondary | ICD-10-CM

## 2017-07-03 MED ORDER — OLOPATADINE HCL 0.1 % OP SOLN: 1.0000 [drp] | Freq: Two times a day (BID) | OPHTHALMIC | 3 refills | Status: DC

## 2017-07-03 MED ORDER — AZELASTINE HCL 0.1 % NA SOLN: 2.0000 | Freq: Every evening | NASAL | 3 refills | Status: DC | PRN

## 2017-07-03 NOTE — Patient Instructions (Signed)
For  allergies: Zyrtec 10 mg OTC 1 tablet daily  Flonase OTC: 2 sprays on each side of the nose in the morning Astelin, RX:  2 sprays on each side of the nose at bedtime  patanol eye drops as twice a day until better  Mucinex DM OTC as needed for cough  Call if not gradually improving  Call if you develop fever, chills, worsening congestion. May need antibiotics.

## 2017-07-03 NOTE — Progress Notes (Signed)
Subjective:    Patient ID: [name], male    DOB: 04/10/1956, 61 y.o.   MRN: [id number]  DOS:  07/03/2017 Type of visit - description : acute Interval history: Last weekend,  he played golf outdoors and felt great. Symptoms started 06-30-2017: Itchy eyes, sneezing, sinus pain and congestion. Taking Zyrtec and Astelin from his wife and that's helping.  Review of Systems Denies fever chills Admits to some cough with no sputum production, mild chest congestion. + Watery runny nose + Itchy nose.   Past Medical History:  Diagnosis Date  . ADD (attention deficit disorder)    adult- per psych  . Adenomatous colon polyp   . Allergy   . Cancer ([place])    basal cell skin cancer   . Diverticulitis    Perforated sigmoid diverticulitis   . Fall 4-10   fx left hip and wrist  . Heart murmur    Echo 2003 "thickened and somehow myomateous MV, mild MR"  . HOH (hard of hearing)   . Hydrocele 2016   Large, symptomatic left hydrocele, planned hydrocelectomy in 10/2015 with Dr. Diona Fanti  . Insomnia    per psych  . Wears glasses     Past Surgical History:  Procedure Laterality Date  . COLONOSCOPY    . HIP SURGERY Left 2011   ORIF femur  . HYDROCELE EXCISION Left 11/02/2015   Procedure: HYDROCELECTOMY ADULT;  Surgeon: Franchot Gallo, MD;  Location: [facility];  Service: Urology;  Laterality: Left;  . LAPAROSCOPIC SIGMOID COLECTOMY N/A 02/11/2013   Procedure: LAPAROSCOPIC SIGMOID COLECTOMY WITH MOBILIZATION SPLENIC FLEXURE;  Surgeon: Madilyn Hook, DO;  Location: [facility];  Service: General;  Laterality: N/A;  . nose biopsy  2011   Dr Allyson Sabal, no cancer  . POLYPECTOMY    . WRIST SURGERY  2011   left    Social History   Social History  . Marital status: Married    Spouse name: [name]  . Number of children: 3  . Years of education: N/A   Occupational History  . [employer]  [employer]   Social History Main Topics  . Smoking status: Never Smoker  .  Smokeless tobacco: Never Used  . Alcohol use 3.6 oz/week    6 Glasses of wine per week     Comment: MODERATION  . Drug use: No  . Sexual activity: Not on file   Other Topics Concern  . Not on file   Social History Narrative   3 adult children, 2 g-children   Household-- pt and wife            Allergies as of 07/03/2017   No Known Allergies     Medication List       Accurate as of 07/03/17 11:59 PM. Always use your most recent med list.          amphetamine-dextroamphetamine 30 MG tablet Commonly known as:  ADDERALL Take 30 mg by mouth daily before breakfast.   azelastine 0.1 % nasal spray Commonly known as:  ASTELIN Place 2 sprays into both nostrils at bedtime as needed for rhinitis. Use in each nostril as directed   CENTRUM ULTRA MENS Tabs Take by mouth. Three times a week--Monday, Wednesday, Friday   clindamycin 1 % gel Commonly known as:  CLINDAGEL Apply topically 2 (two) times daily as needed.   olopatadine 0.1 % ophthalmic solution Commonly known as:  PATANOL Place 1 drop into both eyes 2 (two) times daily.  traZODone 100 MG tablet Commonly known as:  DESYREL Take 50 mg by mouth at bedtime.          Objective:   Physical Exam BP 118/68 (BP Location: Left Arm, Patient Position: Sitting, Cuff Size: Normal)   Pulse 86   Temp 98.3 F (36.8 C) (Oral)   Resp 14   Ht 6\' 1"  (1.854 m)   Wt 184 lb (83.5 kg)   SpO2 96%   BMI 24.28 kg/m  General:   Well developed, well nourished . NAD.  HEENT:  Normocephalic . Face symmetric, atraumatic. TMs normal. Nose is slightly congested, voice slightly nasal. Throat: Symmetric. Sinuses: No TTP. Eyes: No red or watery Lungs:  CTA B Normal respiratory effort, no intercostal retractions, no accessory muscle use. Heart: RRR,  no murmur.  No pretibial edema bilaterally  Skin: Not pale. Not jaundice Neurologic:  alert & oriented X3.  Speech normal, gait appropriate for age and unassisted Psych--  Cognition  and judgment appear intact.  Cooperative with normal attention span and concentration.  Behavior appropriate. No anxious or depressed appearing.      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 Call if no better, this also could be a developing URI or sinus infection. If not better consider amoxicillin.

## 2017-07-03 NOTE — Progress Notes (Signed)
Pre visit review using our clinic review tool, if applicable. No additional management support is needed unless otherwise documented below in the visit note. 

## 2017-07-04 NOTE — Assessment & Plan Note (Signed)
Allergic rhinitis, sinusitis: Will treat with Zyrtec, Flonase, Astelin, Patanol. Call if no better, this also could be a developing URI or sinus infection. If not better consider amoxicillin.

## 2017-07-07 ENCOUNTER — Telehealth: Payer: Self-pay

## 2017-07-07 NOTE — Telephone Encounter (Signed)
PA initiated via Covermymeds; KEY: [id number]. Received real-time PA approval through 07/07/2018.

## 2017-12-24 NOTE — Progress Notes (Signed)
[name] [place], [place] [address] Phone: [phone] Subjective:     CC: Foot pain bilaterally  [id number]  [name] is a 62 y.o. male coming in with complaint of foot pain.  Patient does have pes planus noted bilaterally with over pronation of the hindfoot.  Has been in custom orthotics.  Made over a year ago.  Starting to have worsening discomfort and pain again. Patient feels that he needs more orthotics.     Past Medical History:  Diagnosis Date  . ADD (attention deficit disorder)    adult- per psych  . Adenomatous colon polyp   . Allergy   . Cancer ([place])    basal cell skin cancer   . Diverticulitis    Perforated sigmoid diverticulitis   . Fall 4-10   fx left hip and wrist  . Heart murmur    Echo 2003 "thickened and somehow myomateous MV, mild MR"  . HOH (hard of hearing)   . Hydrocele 2016   Large, symptomatic left hydrocele, planned hydrocelectomy in 10/2015 with Dr. Diona Fanti  . Insomnia    per psych  . Wears glasses    Past Surgical History:  Procedure Laterality Date  . COLONOSCOPY    . HIP SURGERY Left 2011   ORIF femur  . HYDROCELE EXCISION Left 11/02/2015   Procedure: HYDROCELECTOMY ADULT;  Surgeon: Franchot Gallo, MD;  Location: [facility];  Service: Urology;  Laterality: Left;  . LAPAROSCOPIC SIGMOID COLECTOMY N/A 02/11/2013   Procedure: LAPAROSCOPIC SIGMOID COLECTOMY WITH MOBILIZATION SPLENIC FLEXURE;  Surgeon: Madilyn Hook, DO;  Location: [facility];  Service: General;  Laterality: N/A;  . nose biopsy  2011   Dr Allyson Sabal, no cancer  . POLYPECTOMY    . WRIST SURGERY  2011   left   Social History   Socioeconomic History  . Marital status: Married    Spouse name: [name]  . Number of children: 3  . Years of education: None  . Highest education level: None  Social Needs  . Financial resource strain: None  . Food insecurity - worry: None  . Food insecurity - inability: None  .  Transportation needs - medical: None  . Transportation needs - non-medical: None  Occupational History  . Occupation: [employer] doctor: [employer]  Tobacco Use  . Smoking status: Never Smoker  . Smokeless tobacco: Never Used  Substance and Sexual Activity  . Alcohol use: Yes    Alcohol/week: 3.6 oz    Types: 6 Glasses of wine per week    Comment: MODERATION  . Drug use: No  . Sexual activity: None  Other Topics Concern  . None  Social History Narrative   3 adult children, 2 g-children   Household-- pt and wife      No Known Allergies Family History  Problem Relation Age of Onset  . Breast cancer Mother   . Heart failure Mother   . Lung cancer Father        smoker  . Diabetes Neg Hx   . Hypertension Neg Hx   . Colon cancer Neg Hx   . Prostate cancer Neg Hx   . Heart attack Neg Hx   . Colon polyps Neg Hx   . Esophageal cancer Neg Hx   . Rectal cancer Neg Hx   . Stomach cancer Neg Hx      Past medical history, social, surgical and family history all  reviewed in electronic medical record.  No pertanent information unless stated regarding to the chief complaint.   Review of Systems:Review of systems updated and as accurate as of 12/25/17  No headache, visual changes, nausea, vomiting, diarrhea, constipation, dizziness, abdominal pain, skin rash, fevers, chills, night sweats, weight loss, swollen lymph nodes, body aches, joint swelling, muscle aches, chest pain, shortness of breath, mood changes.   Objective  Blood pressure 120/82, pulse 99, height 6' (1.829 m), weight 184 lb (83.5 kg), SpO2 97 %. Systems examined below as of 12/25/17   General: No apparent distress alert and oriented x3 mood and affect normal, dressed appropriately.  HEENT: Pupils equal, extraocular movements intact  Respiratory: Patient's speak in full sentences and does not appear short of breath  Cardiovascular: No lower extremity edema, non tender, no erythema  Skin: Warm dry  intact with no signs of infection or rash on extremities or on axial skeleton.  Abdomen: Soft nontender  Neuro: Cranial nerves II through XII are intact, neurovascularly intact in all extremities with 2+ DTRs and 2+ pulses.  Lymph: No lymphadenopathy of posterior or anterior cervical chain or axillae bilaterally.    MSK:  Non tender with full range of motion and good stability and symmetric strength and tone of shoulders, elbows, wrist, hip, knee and ankles bilaterally.  Patient does have some mild antalgic gait.  Pes planus noted.  Overpronation of the hindfoot.  Breakdown the transverse arch bilaterally right greater than left    Impression and Recommendations:     This case required medical decision making of moderate complexity.      Note: This dictation was prepared with Dragon dictation along with smaller phrase technology. Any transcriptional errors that result from this process are unintentional.

## 2017-12-25 ENCOUNTER — Encounter: Payer: Self-pay | Admitting: Family Medicine

## 2017-12-25 ENCOUNTER — Ambulatory Visit: Payer: BLUE CROSS/BLUE SHIELD | Admitting: Family Medicine

## 2017-12-25 DIAGNOSIS — R269 Unspecified abnormalities of gait and mobility: Secondary | ICD-10-CM | POA: Diagnosis not present

## 2017-12-25 NOTE — Patient Instructions (Signed)
Good to see you  [name] is your friend.  Stay active.  We will get you your size See me again then maybe 2-3 weeks after having them but otherwise see me when you need me

## 2017-12-25 NOTE — Assessment & Plan Note (Signed)
Needs new orthotics.  Will be set up in the near future.  We discussed icing regimen.  Home exercises.  Topical anti-inflammatories.  Follow-up again in 4 weeks

## 2018-01-20 ENCOUNTER — Ambulatory Visit: Payer: BLUE CROSS/BLUE SHIELD | Admitting: Family Medicine

## 2018-01-20 DIAGNOSIS — Z0289 Encounter for other administrative examinations: Secondary | ICD-10-CM

## 2018-02-02 NOTE — Progress Notes (Signed)
Procedure Note   Patient was fitted for a : standard, cushioned, semi-rigid orthotic. The orthotic was heated and afterward the patient patient seated position and molded The patient was positioned in subtalar neutral position and 10 degrees of ankle dorsiflexion in a weight bearing stance. After completion of molding, patient did have orthotic management The blank was ground to a stable position for weight bearing. Size: 12 Base: Carbon fiber Additional Posting and Padding: L medial: 300/110, Transverse: 250/35   R medial: 300/110, Lateral: 270/90, Transverse: 250/35 The patient ambulated these, and they were very comfortable.

## 2018-02-03 ENCOUNTER — Encounter: Payer: Self-pay | Admitting: Family Medicine

## 2018-02-03 ENCOUNTER — Ambulatory Visit (INDEPENDENT_AMBULATORY_CARE_PROVIDER_SITE_OTHER): Payer: BLUE CROSS/BLUE SHIELD | Admitting: Family Medicine

## 2018-02-03 DIAGNOSIS — R269 Unspecified abnormalities of gait and mobility: Secondary | ICD-10-CM

## 2018-02-03 NOTE — Assessment & Plan Note (Signed)
Patient did have more of an abnormality again.  Put in custom orthotics.  I think that this will be significantly beneficial.  We discussed icing regimen and home exercises.  Discussed which activities to do.  Increase wear of the orthotics and follow-up again in 4 weeks

## 2018-06-23 ENCOUNTER — Encounter: Payer: Self-pay | Admitting: Internal Medicine

## 2018-06-23 ENCOUNTER — Ambulatory Visit (INDEPENDENT_AMBULATORY_CARE_PROVIDER_SITE_OTHER): Payer: PRIVATE HEALTH INSURANCE | Admitting: Internal Medicine

## 2018-06-23 VITALS — BP 136/74 | HR 96 | Temp 97.5°F | Resp 16 | Ht 72.0 in | Wt 186.2 lb

## 2018-06-23 DIAGNOSIS — R0989 Other specified symptoms and signs involving the circulatory and respiratory systems: Secondary | ICD-10-CM | POA: Diagnosis not present

## 2018-06-23 DIAGNOSIS — Z0000 Encounter for general adult medical examination without abnormal findings: Secondary | ICD-10-CM | POA: Diagnosis not present

## 2018-06-23 DIAGNOSIS — R739 Hyperglycemia, unspecified: Secondary | ICD-10-CM | POA: Diagnosis not present

## 2018-06-23 MED ORDER — AZELASTINE HCL 0.1 % NA SOLN: 2.0000 | Freq: Every evening | NASAL | 5 refills | Status: DC | PRN

## 2018-06-23 MED ORDER — CROMOLYN SODIUM 5.2 MG/ACT NA AERS: 1.0000 | INHALATION_SPRAY | Freq: Three times a day (TID) | NASAL | 12 refills | Status: DC

## 2018-06-23 MED ORDER — DICLOFENAC SODIUM 1 % TD GEL: 4.0000 g | Freq: Four times a day (QID) | TRANSDERMAL | 3 refills | Status: DC | PRN

## 2018-06-23 NOTE — Patient Instructions (Signed)
GO TO THE LAB : Get the blood work     GO TO THE FRONT DESK Schedule your next appointment for a physical exam in 1 year  For nasal allergies: Nasalcrom twice or 3 times a day Astelin every night  For wrist pain: Use diclofenac as needed

## 2018-06-23 NOTE — Progress Notes (Signed)
Pre visit review using our clinic review tool, if applicable. No additional management support is needed unless otherwise documented below in the visit note. 

## 2018-06-23 NOTE — Assessment & Plan Note (Signed)
--  Td 2010; zostavax --12-2015 --CCS: *cscope 05/2013--Dr Perry--multiple adenomas *cscope again 05-2016, 5 years --Prostate ca screening : DRE normal 2018, PSA slightly higher than before, increased velocity?.  Recheck a PSA -- Diet, exercise: Counseled. --Labs: CMP, FLP, CBC, A1c, TSH, PSA

## 2018-06-23 NOTE — Progress Notes (Signed)
Subjective:    Patient ID: [name], male    DOB: 03/03/1956, 62 y.o.   MRN: [id number]  DOS:  06/23/2018 Type of visit - description : cpx Interval history: Since the last office visit he is doing well. He has noted nasal allergies, on and off, this time of the year allergies are usually very active.    Had injury of the left wrist, had surgery, having aches and pains at the area on and off.  Treatment?   Review of Systems  Other than above, a 14 point review of systems is negative      Past Medical History:  Diagnosis Date  . ADD (attention deficit disorder)    adult- per psych  . Adenomatous colon polyp   . Allergy   . Cancer ([place])    basal cell skin cancer   . Diverticulitis    Perforated sigmoid diverticulitis   . Fall 4-10   fx left hip and wrist  . Heart murmur    Echo 2003 "thickened and somehow myomateous MV, mild MR"  . HOH (hard of hearing)   . Hydrocele 2016   Large, symptomatic left hydrocele, planned hydrocelectomy in 10/2015 with Dr. Diona Fanti  . Insomnia    per psych  . Wears glasses     Past Surgical History:  Procedure Laterality Date  . COLONOSCOPY    . HIP SURGERY Left 2011   ORIF femur  . HYDROCELE EXCISION Left 11/02/2015   Procedure: HYDROCELECTOMY ADULT;  Surgeon: Franchot Gallo, MD;  Location: [facility];  Service: Urology;  Laterality: Left;  . LAPAROSCOPIC SIGMOID COLECTOMY N/A 02/11/2013   Procedure: LAPAROSCOPIC SIGMOID COLECTOMY WITH MOBILIZATION SPLENIC FLEXURE;  Surgeon: Madilyn Hook, DO;  Location: [facility];  Service: General;  Laterality: N/A;  . nose biopsy  2011   Dr Allyson Sabal, no cancer  . POLYPECTOMY    . WRIST SURGERY  2011   left    Social History   Socioeconomic History  . Marital status: Married    Spouse name: [name]  . Number of children: 3  . Years of education: Not on file  . Highest education level: Not on file  Occupational History  . Occupation: Science writer, Engineer, manufacturing  Social Needs  .  Financial resource strain: Not on file  . Food insecurity:    Worry: Not on file    Inability: Not on file  . Transportation needs:    Medical: Not on file    Non-medical: Not on file  Tobacco Use  . Smoking status: Never Smoker  . Smokeless tobacco: Never Used  Substance and Sexual Activity  . Alcohol use: Yes    Alcohol/week: 3.6 oz    Types: 6 Glasses of wine per week    Comment: MODERATION  . Drug use: No  . Sexual activity: Not on file  Lifestyle  . Physical activity:    Days per week: Not on file    Minutes per session: Not on file  . Stress: Not on file  Relationships  . Social connections:    Talks on phone: Not on file    Gets together: Not on file    Attends religious service: Not on file    Active member of club or organization: Not on file    Attends meetings of clubs or organizations: Not on file    Relationship status: Not on file  . Intimate partner violence:    Fear of current or ex partner: Not on file  Emotionally abused: Not on file    Physically abused: Not on file    Forced sexual activity: Not on file  Other Topics Concern  . Not on file  Social History Narrative   3 adult children, 3 g-children   Household-- pt and wife        Family History  Problem Relation Age of Onset  . Breast cancer Mother   . Heart failure Mother   . Lung cancer Father        smoker  . Diabetes Neg Hx   . Hypertension Neg Hx   . Colon cancer Neg Hx   . Prostate cancer Neg Hx   . Heart attack Neg Hx   . Colon polyps Neg Hx   . Esophageal cancer Neg Hx   . Rectal cancer Neg Hx   . Stomach cancer Neg Hx      Allergies as of 06/23/2018   No Known Allergies     Medication List        Accurate as of 06/23/18 11:59 PM. Always use your most recent med list.          amphetamine-dextroamphetamine 30 MG tablet Commonly known as:  ADDERALL Take 30 mg by mouth daily before breakfast.   azelastine 0.1 % nasal spray Commonly known as:  ASTELIN Place 2 sprays  into both nostrils at bedtime as needed for rhinitis or allergies. Use in each nostril as directed   CENTRUM ULTRA MENS Tabs Take by mouth. Three times a week--Monday, Wednesday, Friday   clindamycin 1 % gel Commonly known as:  CLINDAGEL Apply topically 2 (two) times daily as needed.   cromolyn 5.2 MG/ACT nasal spray Commonly known as:  NASALCROM Place 1 spray into both nostrils 3 (three) times daily.   diclofenac sodium 1 % Gel Commonly known as:  VOLTAREN Apply 4 g topically 4 (four) times daily as needed.   olopatadine 0.1 % ophthalmic solution Commonly known as:  PATANOL Place 1 drop into both eyes 2 (two) times daily.   traZODone 100 MG tablet Commonly known as:  DESYREL Take 50 mg by mouth at bedtime.          Objective:   Physical Exam BP 136/74 (BP Location: Left Arm, Patient Position: Sitting, Cuff Size: Small)   Pulse 96   Temp (!) 97.5 F (36.4 C) (Oral)   Resp 16   Ht 6' (1.829 m)   Wt 186 lb 4 oz (84.5 kg)   SpO2 96%   BMI 25.26 kg/m  General: Well developed, NAD, see BMI.  Neck: No  thyromegaly  HEENT:  Normocephalic . Face symmetric, atraumatic Lungs:  CTA B Normal respiratory effort, no intercostal retractions, no accessory muscle use. Heart: RRR,  no murmur.  No pretibial edema bilaterally  Abdomen:  Not distended, soft, non-tender. No rebound or rigidity.   Mid abdomen palpable nontender aorta, no bruit Skin: Exposed areas without rash. Not pale. Not jaundice Neurologic:  alert & oriented X3.  Speech normal, gait appropriate for age and unassisted Strength symmetric and appropriate for age.  Psych: Cognition and judgment appear intact.  Cooperative with normal attention span and concentration.  Behavior appropriate. No anxious or depressed appearing.     Assessment & Plan:   Assessment ADD,  Insomnia per psychiatry Snoring  palpable aorta: CT 2014 no AAA H/o diverticulitis, perforated, sigmoid colectomy 2014 H/o Hydrocele  2016, large, symptomatic, saw urology, excised  H/o BCC, folliculitis sees derm, used to see  Dr Allyson Sabal  H/o  heart murmur, echo 2013 --thick MV, mild MR  Plan: Allergic rhinitis: Previously tried Flonase, Astelin, was never 100% satisfied with treatment.  Trial with cromolyn twice or 3 times a day; also  Astelin.  He also think that septum deviation is playing a role, previously saw ENT, he is considering seeing them again as  symptoms continue. Palpable aorta: Last imagine 5 years ago, check ultrasound Wrist pain: Status post surgery, years ago.  Occasionally has pain, early DJD?  Recommend treatment with topical diclofenac RTC 1 year

## 2018-06-24 LAB — LIPID PANEL
CHOL/HDL RATIO: 3
Cholesterol: 234 mg/dL — ABNORMAL HIGH (ref 0–200)
HDL: 85.7 mg/dL (ref >39.00)
LDL Cholesterol: 134 mg/dL — ABNORMAL HIGH (ref 0–99)
NonHDL: 148.42
TRIGLYCERIDES: 74 mg/dL (ref 0.0–149.0)
VLDL: 14.8 mg/dL (ref 0.0–40.0)

## 2018-06-24 LAB — CBC WITH DIFFERENTIAL/PLATELET
BASOS ABS: 0 10*3/uL (ref 0.0–0.1)
Basophils Relative: 0.6 % (ref 0.0–3.0)
Eosinophils Absolute: 0 10*3/uL (ref 0.0–0.7)
Eosinophils Relative: 0.2 % (ref 0.0–5.0)
HEMATOCRIT: 44.9 % (ref 39.0–52.0)
HEMOGLOBIN: 15.3 g/dL (ref 13.0–17.0)
LYMPHS PCT: 21.3 % (ref 12.0–46.0)
Lymphs Abs: 1.7 10*3/uL (ref 0.7–4.0)
MCHC: 34.1 g/dL (ref 30.0–36.0)
MCV: 94.8 fl (ref 78.0–100.0)
Monocytes Absolute: 0.5 10*3/uL (ref 0.1–1.0)
Monocytes Relative: 6.4 % (ref 3.0–12.0)
NEUTROS ABS: 5.8 10*3/uL (ref 1.4–7.7)
Neutrophils Relative %: 71.5 % (ref 43.0–77.0)
PLATELETS: 240 10*3/uL (ref 150.0–400.0)
RBC: 4.73 Mil/uL (ref 4.22–5.81)
RDW: 13.5 % (ref 11.5–15.5)
WBC: 8.1 10*3/uL (ref 4.0–10.5)

## 2018-06-24 LAB — TSH: TSH: 4.36 u[IU]/mL (ref 0.35–4.50)

## 2018-06-24 LAB — COMPREHENSIVE METABOLIC PANEL
ALT: 18 U/L (ref 0–53)
AST: 20 U/L (ref 0–37)
Albumin: 4.3 g/dL (ref 3.5–5.2)
Alkaline Phosphatase: 50 U/L (ref 39–117)
BUN: 19 mg/dL (ref 6–23)
CALCIUM: 9.5 mg/dL (ref 8.4–10.5)
CO2: 28 meq/L (ref 19–32)
Chloride: 100 mEq/L (ref 96–112)
Creatinine, Ser: 1.05 mg/dL (ref 0.40–1.50)
GFR: 75.83 mL/min (ref >60.00)
Glucose, Bld: 101 mg/dL — ABNORMAL HIGH (ref 70–99)
Potassium: 4.1 mEq/L (ref 3.5–5.1)
Sodium: 137 mEq/L (ref 135–145)
Total Bilirubin: 0.9 mg/dL (ref 0.2–1.2)
Total Protein: 7.1 g/dL (ref 6.0–8.3)

## 2018-06-24 LAB — PSA: PSA: 1.3 ng/mL (ref 0.10–4.00)

## 2018-06-24 LAB — HEMOGLOBIN A1C: Hgb A1c MFr Bld: 5.7 % (ref 4.6–6.5)

## 2018-06-24 NOTE — Assessment & Plan Note (Signed)
Allergic rhinitis: Previously tried Flonase, Astelin, was never 100% satisfied with treatment.  Trial with cromolyn twice or 3 times a day; also  Astelin.  He also think that septum deviation is playing a role, previously saw ENT, he is considering seeing them again as  symptoms continue. Palpable aorta: Last imagine 5 years ago, check ultrasound Wrist pain: Status post surgery, years ago.  Occasionally has pain, early DJD?  Recommend treatment with topical diclofenac RTC 1 year

## 2018-06-30 ENCOUNTER — Ambulatory Visit (HOSPITAL_BASED_OUTPATIENT_CLINIC_OR_DEPARTMENT_OTHER)
Admission: RE | Admit: 2018-06-30 | Discharge: 2018-06-30 | Disposition: A | Discharge status: Home / Self Care | Payer: 59 | Source: Ambulatory Visit | Attending: Internal Medicine | Admitting: Internal Medicine

## 2018-06-30 ENCOUNTER — Other Ambulatory Visit: Payer: Self-pay

## 2018-06-30 DIAGNOSIS — I77811 Abdominal aortic ectasia: Secondary | ICD-10-CM | POA: Insufficient documentation

## 2018-06-30 DIAGNOSIS — R0989 Other specified symptoms and signs involving the circulatory and respiratory systems: Secondary | ICD-10-CM | POA: Diagnosis not present

## 2018-10-06 ENCOUNTER — Ambulatory Visit: Payer: 59 | Admitting: Internal Medicine

## 2018-10-06 ENCOUNTER — Encounter: Payer: Self-pay | Admitting: Internal Medicine

## 2018-10-06 VITALS — BP 128/88 | HR 68 | Temp 98.7°F | Resp 16 | Ht 72.0 in | Wt 185.0 lb

## 2018-10-06 DIAGNOSIS — J019 Acute sinusitis, unspecified: Secondary | ICD-10-CM

## 2018-10-06 MED ORDER — AMOXICILLIN-POT CLAVULANATE 875-125 MG PO TABS: 1.0000 | ORAL_TABLET | Freq: Two times a day (BID) | ORAL | 0 refills | Status: DC

## 2018-10-06 NOTE — Progress Notes (Signed)
Pre visit review using our clinic review tool, if applicable. No additional management support is needed unless otherwise documented below in the visit note. 

## 2018-10-06 NOTE — Progress Notes (Signed)
Subjective:    Patient ID: [name], male    DOB: 1956-10-09, 61 y.o.   MRN: [id number]  DOS:  10/06/2018 Type of visit - description : acute Symptoms are started 3 weeks ago with sinus congestion, sneezing, cough. She is taking consistently Mucinex DM and nasal sprays. Symptoms are relatively controlled but he continue with bilateral sinus pressure, pain. He also has bilateral ear congestion.   Review of Systems No fever chills Occasional cough and chest congestion with a small amount of his sputum production  Past Medical History:  Diagnosis Date  . ADD (attention deficit disorder)    adult- per psych  . Adenomatous colon polyp   . Allergy   . Cancer ([place])    basal cell skin cancer   . Diverticulitis    Perforated sigmoid diverticulitis   . Fall 4-10   fx left hip and wrist  . Heart murmur    Echo 2003 "thickened and somehow myomateous MV, mild MR"  . HOH (hard of hearing)   . Hydrocele 2016   Large, symptomatic left hydrocele, planned hydrocelectomy in 10/2015 with Dr. Diona Fanti  . Insomnia    per psych  . Wears glasses     Past Surgical History:  Procedure Laterality Date  . HIP SURGERY Left 2011   ORIF femur  . HYDROCELE EXCISION Left 11/02/2015   Procedure: HYDROCELECTOMY ADULT;  Surgeon: Franchot Gallo, MD;  [facility];  Service: Urology;  Laterality: Left;  . LAPAROSCOPIC SIGMOID COLECTOMY N/A 02/11/2013   Procedure: LAPAROSCOPIC SIGMOID COLECTOMY WITH MOBILIZATION SPLENIC FLEXURE;  Surgeon: Madilyn Hook, DO;  Location: [facility];  Service: General;  Laterality: N/A;  . nose biopsy  2011   Dr Allyson Sabal, no cancer  . POLYPECTOMY    . WRIST SURGERY  2011   left    Social History   Socioeconomic History  . Marital status: Married    Spouse name: [name]  . Number of children: 3  . Years of education: Not on file  . Highest education level: Not on file  Occupational History  . Occupation: Science writer, Engineer, manufacturing  Social Needs    . Financial resource strain: Not on file  . Food insecurity:    Worry: Not on file    Inability: Not on file  . Transportation needs:    Medical: Not on file    Non-medical: Not on file  Tobacco Use  . Smoking status: Never Smoker  . Smokeless tobacco: Never Used  Substance and Sexual Activity  . Alcohol use: Yes    Alcohol/week: 6.0 standard drinks    Types: 6 Glasses of wine per week    Comment: MODERATION  . Drug use: No  . Sexual activity: Not on file  Lifestyle  . Physical activity:    Days per week: Not on file    Minutes per session: Not on file  . Stress: Not on file  Relationships  . Social connections:    Talks on phone: Not on file    Gets together: Not on file    Attends religious service: Not on file    Active member of club or organization: Not on file    Attends meetings of clubs or organizations: Not on file    Relationship status: Not on file  . Intimate partner violence:    Fear of current or ex partner: Not on file    Emotionally abused: Not on file    Physically abused: Not on file  Forced sexual activity: Not on file  Other Topics Concern  . Not on file  Social History Narrative   3 adult children, 3 g-children   Household-- pt and wife         Allergies as of 10/06/2018   No Known Allergies     Medication List        Accurate as of 10/06/18  6:22 PM. Always use your most recent med list.          amoxicillin-clavulanate 875-125 MG tablet Commonly known as:  AUGMENTIN Take 1 tablet by mouth 2 (two) times daily.   amphetamine-dextroamphetamine 30 MG tablet Commonly known as:  ADDERALL Take 30 mg by mouth daily before breakfast.   azelastine 0.1 % nasal spray Commonly known as:  ASTELIN Place 2 sprays into both nostrils at bedtime as needed for rhinitis or allergies. Use in each nostril as directed   CENTRUM ULTRA MENS Tabs Take by mouth. Three times a week--Monday, Wednesday, Friday   clindamycin 1 % gel Commonly known  as:  CLINDAGEL Apply topically 2 (two) times daily as needed.   cromolyn 5.2 MG/ACT nasal spray Commonly known as:  NASALCROM Place 1 spray into both nostrils 3 (three) times daily.   diclofenac sodium 1 % Gel Commonly known as:  VOLTAREN Apply 4 g topically 4 (four) times daily as needed.   olopatadine 0.1 % ophthalmic solution Commonly known as:  PATANOL Place 1 drop into both eyes 2 (two) times daily.   traZODone 100 MG tablet Commonly known as:  DESYREL Take 50 mg by mouth at bedtime.           Objective:   Physical Exam BP 128/88 (BP Location: Left Arm, Patient Position: Sitting, Cuff Size: Small)   Pulse 68   Temp 98.7 F (37.1 C) (Oral)   Resp 16   Ht 6' (1.829 m)   Wt 185 lb (83.9 kg)   SpO2 97%   BMI 25.09 kg/m  General:   Well developed, NAD, BMI noted. HEENT:  Normocephalic . Face symmetric, atraumatic.  TMs normal.  Throat symmetric and not red, nose congested.  Sinuses slightly TTP, apparently more so on the left Lungs:  CTA B Normal respiratory effort, no intercostal retractions, no accessory muscle use. Heart: RRR,  no murmur.  No pretibial edema bilaterally  Skin: Not pale. Not jaundice Neurologic:  alert & oriented X3.  Speech normal, gait appropriate for age and unassisted Psych--  Cognition and judgment appear intact.  Cooperative with normal attention span and concentration.  Behavior appropriate. No anxious or depressed appearing.      Assessment & Plan:    Assessment ADD,  Insomnia per psychiatry Snoring  palpable aorta: CT 2014 no AAA H/o diverticulitis, perforated, sigmoid colectomy 2014 H/o Hydrocele 2016, large, symptomatic, saw urology, excised  H/o BCC, folliculitis sees derm, used to see  Dr Allyson Sabal   H/o  heart murmur, echo 2013 --thick MV, mild MR  Plan: Mild sinusitis: Not responding to 3 weeks of conservative treatment.  Will add Augmentin, continue nose sprays, Mucinex DM.  Call if not improving.  See AVS

## 2018-10-06 NOTE — Assessment & Plan Note (Signed)
Mild sinusitis: Not responding to 3 weeks of conservative treatment.  Will add Augmentin, continue nose sprays, Mucinex DM.  Call if not improving.  See AVS

## 2018-10-06 NOTE — Patient Instructions (Addendum)
Rest, fluids , tylenol  For cough:  Take Mucinex DM twice a day as needed until better  For nasal congestion:continue the sprays    Avoid decongestants such as  Pseudoephedrine or phenylephrine    Take the antibiotic as prescribed   Call if not gradually better over the next  10 days  Call anytime if the symptoms are severe

## 2018-12-30 ENCOUNTER — Other Ambulatory Visit: Payer: Self-pay | Admitting: Internal Medicine

## 2019-01-14 ENCOUNTER — Ambulatory Visit: Payer: 59 | Admitting: Internal Medicine

## 2019-06-22 ENCOUNTER — Encounter: Payer: Self-pay | Admitting: Internal Medicine

## 2019-08-18 ENCOUNTER — Other Ambulatory Visit: Payer: Self-pay | Admitting: Internal Medicine

## 2019-10-07 ENCOUNTER — Other Ambulatory Visit: Payer: Self-pay

## 2019-10-07 ENCOUNTER — Telehealth: Payer: Self-pay

## 2019-10-07 DIAGNOSIS — Z0000 Encounter for general adult medical examination without abnormal findings: Secondary | ICD-10-CM

## 2019-10-07 NOTE — Telephone Encounter (Signed)
CMP, FLP, CBC, A1c, TSH, PSA 

## 2019-10-07 NOTE — Telephone Encounter (Signed)
Copied from [place] [phone]. Topic: General - Other >> Oct 07, 2019 10:51 AM Celene Kras wrote: Reason for CRM: Pt called and is requesting to be able to have his lab work done on the same day as his physical 10/12/2019. Please advise.

## 2019-10-07 NOTE — Telephone Encounter (Signed)
Please advise labs needed for his cpe- visit scheduled 10/12/2019 at 3:40.

## 2019-10-07 NOTE — Telephone Encounter (Signed)
Yes, we can do it when he is here in the afternoon or before the visit.  Just let me know and I will tell you the orders

## 2019-10-07 NOTE — Telephone Encounter (Signed)
PT changed his appt from  3:40 to 1:20 and stated he will do all Labs after CPE

## 2019-10-07 NOTE — Telephone Encounter (Signed)
Please advise orders? 

## 2019-10-07 NOTE — Telephone Encounter (Signed)
Orders placed. Sherri- can you contact Pt and schedule fasting labs at his convenience. TY.

## 2019-10-11 ENCOUNTER — Other Ambulatory Visit: Payer: Self-pay

## 2019-10-12 ENCOUNTER — Encounter: Payer: 59 | Admitting: Internal Medicine

## 2019-10-12 ENCOUNTER — Other Ambulatory Visit: Payer: Self-pay

## 2019-10-12 ENCOUNTER — Ambulatory Visit (INDEPENDENT_AMBULATORY_CARE_PROVIDER_SITE_OTHER): Payer: 59 | Admitting: Internal Medicine

## 2019-10-12 ENCOUNTER — Encounter: Payer: Self-pay | Admitting: Internal Medicine

## 2019-10-12 VITALS — BP 141/87 | HR 83 | Temp 96.8°F | Resp 16 | Ht 72.0 in | Wt 180.4 lb

## 2019-10-12 DIAGNOSIS — Z125 Encounter for screening for malignant neoplasm of prostate: Secondary | ICD-10-CM | POA: Diagnosis not present

## 2019-10-12 DIAGNOSIS — Z0000 Encounter for general adult medical examination without abnormal findings: Secondary | ICD-10-CM | POA: Diagnosis not present

## 2019-10-12 DIAGNOSIS — R002 Palpitations: Secondary | ICD-10-CM

## 2019-10-12 DIAGNOSIS — F988 Other specified behavioral and emotional disorders with onset usually occurring in childhood and adolescence: Secondary | ICD-10-CM | POA: Diagnosis not present

## 2019-10-12 DIAGNOSIS — Z23 Encounter for immunization: Secondary | ICD-10-CM

## 2019-10-12 MED ORDER — CLINDAMYCIN PHOSPHATE 1 % EX GEL: Freq: Two times a day (BID) | CUTANEOUS | 6 refills | Status: DC | PRN

## 2019-10-12 NOTE — Assessment & Plan Note (Signed)
-   Tdap today - zostavax --12-2015 - shingrex discussed, first dose today, next in 3 months - had a flu shot  --CCS: *cscope 05/2013--Dr Perry--multiple adenomas *cscope again 05-2016, 5 years --Prostate ca screening : DRE normal today, check a PSA -- Diet, exercise: very active, doing well  --Labs: CMP, FLP, CBC, A1c, TSH, PSA

## 2019-10-12 NOTE — Progress Notes (Signed)
Pre visit review using our clinic review tool, if applicable. No additional management support is needed unless otherwise documented below in the visit note. 

## 2019-10-12 NOTE — Patient Instructions (Addendum)
GO TO THE LAB : Get the blood work     GO TO THE FRONT DESK Schedule your next appointment  For a check up in 3 months   (Shingrix #2 and ADD management)    Check the  blood pressure 2 or 3 times a month  BP GOAL is between 110/65 and  135/85. If it is consistently higher or lower, let me know

## 2019-10-12 NOTE — Progress Notes (Signed)
Subjective:    Patient ID: [name], male    DOB: 1956/02/17, 63 y.o.   MRN: [id number]  DOS:  10/12/2019 Type of visit - description: CPX In general feeling well, he is very active, does have some concerns. This year, he is having palpitations, were more noticeable during March and April however he still have episodes around once a month, they are only nocturnal, last 15 minutes, feels like his heart is going really fast, no associated chest pain but some shortness of breath. No exertional symptoms.  Review of Systems  Other than above, a 14 point review of systems is negative    Past Medical History:  Diagnosis Date   ADD (attention deficit disorder)    adult- per psych   Adenomatous colon polyp    Allergy    Cancer ([place])    basal cell skin cancer    Diverticulitis    Perforated sigmoid diverticulitis    Fall 4-10   fx left hip and wrist   Heart murmur    Echo 2003 "thickened and somehow myomateous MV, mild MR"   HOH (hard of hearing)    Hydrocele 2016   Large, symptomatic left hydrocele, planned hydrocelectomy in 10/2015 with Dr. Diona Fanti   Insomnia    per psych   Wears glasses     Past Surgical History:  Procedure Laterality Date   HIP SURGERY Left 2011   ORIF femur   HYDROCELE EXCISION Left 11/02/2015   Procedure: HYDROCELECTOMY ADULT;  Surgeon: Franchot Gallo, MD;  Location: [facility];  Service: Urology;  Laterality: Left;   LAPAROSCOPIC SIGMOID COLECTOMY N/A 02/11/2013   Procedure: LAPAROSCOPIC SIGMOID COLECTOMY WITH MOBILIZATION SPLENIC FLEXURE;  Surgeon: Madilyn Hook, DO;  Location: [facility];  Service: General;  Laterality: N/A;   nose biopsy  2011   Dr Allyson Sabal, no cancer   POLYPECTOMY     WRIST SURGERY  2011   left   Family History  Problem Relation Age of Onset   Breast cancer Mother    Heart failure Mother    Lung cancer Father        smoker   Diabetes Neg Hx    Hypertension Neg Hx    Colon cancer Neg  Hx    Prostate cancer Neg Hx    Heart attack Neg Hx    Colon polyps Neg Hx    Esophageal cancer Neg Hx    Rectal cancer Neg Hx    Stomach cancer Neg Hx      Social History   Socioeconomic History   Marital status: Married    Spouse name: [name]   Number of children: 3   Years of education: Not on file   Highest education level: Not on file  Occupational History   Occupation: Science writer, [employer] resource strain: Not on file   Food insecurity    Worry: Not on file    Inability: Not on file   Transportation needs    Medical: Not on file    Non-medical: Not on file  Tobacco Use   Smoking status: Never Smoker   Smokeless tobacco: Never Used  Substance and Sexual Activity   Alcohol use: Yes    Alcohol/week: 6.0 standard drinks    Types: 6 Glasses of wine per week    Comment: MODERATION   Drug use: No   Sexual activity: Not on file  Lifestyle   Physical activity    Days per week: Not  on file    Minutes per session: Not on file   Stress: Not on file  Relationships   Social connections    Talks on phone: Not on file    Gets together: Not on file    Attends religious service: Not on file    Active member of club or organization: Not on file    Attends meetings of clubs or organizations: Not on file    Relationship status: Not on file   Intimate partner violence    Fear of current or ex partner: Not on file    Emotionally abused: Not on file    Physically abused: Not on file    Forced sexual activity: Not on file  Other Topics Concern   Not on file  Social History Narrative   3 adult children, 3 g-children   Household-- pt and wife         Allergies as of 10/12/2019   No Known Allergies     Medication List       Accurate as of October 12, 2019 11:59 PM. If you have any questions, ask your nurse or doctor.        STOP taking these medications   amoxicillin-clavulanate 875-125 MG tablet Commonly known  as: Augmentin Stopped by: Kathlene November, MD     TAKE these medications   Adderall 10 MG tablet Generic drug: amphetamine-dextroamphetamine Take 1 tablet (10 mg total) by mouth daily as needed. What changed: Another medication with the same name was removed. Continue taking this medication, and follow the directions you see here. Changed by: Kathlene November, MD   amphetamine-dextroamphetamine 30 MG 24 hr capsule Commonly known as: ADDERALL XR Take 1 capsule (30 mg total) by mouth every morning. What changed: Another medication with the same name was removed. Continue taking this medication, and follow the directions you see here. Changed by: Kathlene November, MD   azelastine 0.1 % nasal spray Commonly known as: ASTELIN Place 2 sprays into both nostrils at bedtime as needed for rhinitis or allergies.   Centrum Ultra Mens Tabs Take by mouth. Three times a week--Monday, Wednesday, Friday   clindamycin 1 % gel Commonly known as: CLINDAGEL Apply topically 2 (two) times daily as needed.   cromolyn 5.2 MG/ACT nasal spray Commonly known as: NasalCrom Place 1 spray into both nostrils 3 (three) times daily.   diclofenac sodium 1 % Gel Commonly known as: VOLTAREN Apply 4 g topically 4 (four) times daily as needed.   olopatadine 0.1 % ophthalmic solution Commonly known as: PATANOL Place 1 drop into both eyes 2 (two) times daily.   traZODone 100 MG tablet Commonly known as: DESYREL Take 50 mg by mouth at bedtime.           Objective:   Physical Exam BP (!) 141/87 (BP Location: Left Arm, Patient Position: Sitting, Cuff Size: Small)    Pulse 83    Temp (!) 96.8 F (36 C) (Temporal)    Resp 16    Ht 6' (1.829 m)    Wt 180 lb 6 oz (81.8 kg)    SpO2 97%    BMI 24.46 kg/m  General: Well developed, NAD, BMI noted Neck: No  thyromegaly  HEENT:  Normocephalic . Face symmetric, atraumatic Lungs:  CTA B Normal respiratory effort, no intercostal retractions, no accessory muscle use. Heart: RRR,  no  murmur.  No pretibial edema bilaterally  Abdomen:  Not distended, soft, non-tender. No rebound or rigidity.   Skin: Exposed areas without  rash. Not pale. Not jaundice  DRE: Brown stools, normal sphincter tone, prostate is slightly enlarged but symmetrical not tender Neurologic:  alert & oriented X3.  Speech normal, gait appropriate for age and unassisted Strength symmetric and appropriate for age.  Psych: Cognition and judgment appear intact.  Cooperative with normal attention span and concentration.  Behavior appropriate. No anxious or depressed appearing.     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  Wonders if he could transfer his prescriptions here.  Will get records and then start prescribing his meds.  Signed a Soil scientist. Palpitations: As described above, EKG today NSR Checking a CBC and TSH today. Discussed further steps, echo versus cardiology referral.  We agreed on an echo, call if symptoms increase or change. Holter unlikely to help as symptoms are sporadic History of BCC: Sees dermatology regularly, I also send a Rx for an antibiotic cream he uses as needed Sees ophthalmology regularly due to watery eyes. RTC 3 months   This visit occurred during the SARS-CoV-2 public health emergency.  Safety protocols were in place, including screening questions prior to the visit, additional usage of staff PPE, and extensive cleaning of exam room while observing appropriate contact time as indicated for disinfecting solutions.

## 2019-10-13 ENCOUNTER — Encounter (HOSPITAL_COMMUNITY): Payer: Self-pay | Admitting: Internal Medicine

## 2019-10-13 LAB — COMPREHENSIVE METABOLIC PANEL
ALT: 15 U/L (ref 0–53)
AST: 20 U/L (ref 0–37)
Albumin: 4 g/dL (ref 3.5–5.2)
Alkaline Phosphatase: 51 U/L (ref 39–117)
BUN: 19 mg/dL (ref 6–23)
CO2: 28 mEq/L (ref 19–32)
Calcium: 8.9 mg/dL (ref 8.4–10.5)
Chloride: 102 mEq/L (ref 96–112)
Creatinine, Ser: 0.94 mg/dL (ref 0.40–1.50)
GFR: 80.73 mL/min (ref >60.00)
Glucose, Bld: 97 mg/dL (ref 70–99)
Potassium: 4.1 mEq/L (ref 3.5–5.1)
Sodium: 137 mEq/L (ref 135–145)
Total Bilirubin: 1 mg/dL (ref 0.2–1.2)
Total Protein: 6.8 g/dL (ref 6.0–8.3)

## 2019-10-13 LAB — CBC WITH DIFFERENTIAL/PLATELET
Basophils Absolute: 0.1 10*3/uL (ref 0.0–0.1)
Basophils Relative: 0.8 % (ref 0.0–3.0)
Eosinophils Absolute: 0.1 10*3/uL (ref 0.0–0.7)
Eosinophils Relative: 0.9 % (ref 0.0–5.0)
HCT: 44.1 % (ref 39.0–52.0)
Hemoglobin: 14.9 g/dL (ref 13.0–17.0)
Lymphocytes Relative: 26.6 % (ref 12.0–46.0)
Lymphs Abs: 1.8 10*3/uL (ref 0.7–4.0)
MCHC: 33.7 g/dL (ref 30.0–36.0)
MCV: 97.5 fl (ref 78.0–100.0)
Monocytes Absolute: 0.6 10*3/uL (ref 0.1–1.0)
Monocytes Relative: 8.8 % (ref 3.0–12.0)
Neutro Abs: 4.2 10*3/uL (ref 1.4–7.7)
Neutrophils Relative %: 62.9 % (ref 43.0–77.0)
Platelets: 233 10*3/uL (ref 150.0–400.0)
RBC: 4.53 Mil/uL (ref 4.22–5.81)
RDW: 13.9 % (ref 11.5–15.5)
WBC: 6.6 10*3/uL (ref 4.0–10.5)

## 2019-10-13 LAB — TSH: TSH: 4.57 u[IU]/mL — ABNORMAL HIGH (ref 0.35–4.50)

## 2019-10-13 LAB — LIPID PANEL
Cholesterol: 214 mg/dL — ABNORMAL HIGH (ref 0–200)
HDL: 88.8 mg/dL (ref >39.00)
LDL Cholesterol: 110 mg/dL — ABNORMAL HIGH (ref 0–99)
NonHDL: 124.87
Total CHOL/HDL Ratio: 2
Triglycerides: 72 mg/dL (ref 0.0–149.0)
VLDL: 14.4 mg/dL (ref 0.0–40.0)

## 2019-10-13 LAB — PSA: PSA: 1.46 ng/mL (ref 0.10–4.00)

## 2019-10-13 LAB — HEMOGLOBIN A1C: Hgb A1c MFr Bld: 5.3 % (ref 4.6–6.5)

## 2019-10-14 NOTE — Assessment & Plan Note (Signed)
For CPX ADD: Well-controlled with Adderall XR 30 mg daily and occasional Adderall 10 mg.  Wonders if he could transfer his prescriptions here.  Will get records and then start prescribing his meds.  Signed a Soil scientist. Palpitations: As described above, EKG today NSR Checking a CBC and TSH today. Discussed further steps, echo versus cardiology referral.  We agreed on an echo, call if symptoms increase or change. Holter unlikely to help as symptoms are sporadic History of BCC: Sees dermatology regularly, I also send a Rx for an antibiotic cream he uses as needed Sees ophthalmology regularly due to watery eyes. RTC 3 months

## 2019-10-18 ENCOUNTER — Other Ambulatory Visit: Payer: Self-pay | Admitting: Internal Medicine

## 2019-10-19 ENCOUNTER — Ambulatory Visit (HOSPITAL_COMMUNITY): Payer: 59 | Attending: Cardiovascular Disease

## 2019-10-19 ENCOUNTER — Other Ambulatory Visit: Payer: Self-pay

## 2019-10-19 DIAGNOSIS — R002 Palpitations: Secondary | ICD-10-CM | POA: Insufficient documentation

## 2020-01-12 ENCOUNTER — Ambulatory Visit: Payer: 59 | Admitting: Internal Medicine

## 2020-02-04 ENCOUNTER — Other Ambulatory Visit: Payer: Self-pay

## 2020-02-07 ENCOUNTER — Ambulatory Visit: Payer: 59 | Admitting: Internal Medicine

## 2020-02-07 ENCOUNTER — Encounter: Payer: Self-pay | Admitting: Internal Medicine

## 2020-02-07 ENCOUNTER — Other Ambulatory Visit: Payer: Self-pay

## 2020-02-07 VITALS — BP 156/85 | HR 91 | Temp 96.6°F | Resp 16 | Ht 72.0 in | Wt 182.5 lb

## 2020-02-07 DIAGNOSIS — F439 Reaction to severe stress, unspecified: Secondary | ICD-10-CM

## 2020-02-07 DIAGNOSIS — H04203 Unspecified epiphora, bilateral lacrimal glands: Secondary | ICD-10-CM | POA: Diagnosis not present

## 2020-02-07 DIAGNOSIS — Z83518 Family history of other specified eye disorder: Secondary | ICD-10-CM | POA: Diagnosis not present

## 2020-02-07 DIAGNOSIS — N529 Male erectile dysfunction, unspecified: Secondary | ICD-10-CM

## 2020-02-07 DIAGNOSIS — F988 Other specified behavioral and emotional disorders with onset usually occurring in childhood and adolescence: Secondary | ICD-10-CM

## 2020-02-07 MED ORDER — SILDENAFIL CITRATE 20 MG PO TABS: 60.0000 mg | ORAL_TABLET | Freq: Every evening | ORAL | 3 refills | Status: DC | PRN

## 2020-02-07 MED ORDER — AMPHETAMINE-DEXTROAMPHET ER 30 MG PO CP24: 30.0000 mg | ORAL_CAPSULE | ORAL | 0 refills | Status: DC

## 2020-02-07 NOTE — Progress Notes (Signed)
Pre visit review using our clinic review tool, if applicable. No additional management support is needed unless otherwise documented below in the visit note. 

## 2020-02-07 NOTE — Patient Instructions (Signed)
   GO TO THE FRONT DESK, please reschedule your appointments Come back for a nurse visit in 6 weeks (shingrix)  Come back for a physical exam by 09-2020

## 2020-02-07 NOTE — Progress Notes (Signed)
Subjective:    Patient ID: [name], male    DOB: [birth year], 64 y.o.   MRN: [id number]  DOS:  02/07/2020 Type of visit - description: Follow-up Today we talk about palpitations, stress, vaccinations, and a new issue: ED. Occasionally has difficulty with erections, this is new thing that started few months ago.  Palpitations resolved, he thinks after all it was related to stress.  Review of Systems See above   Past Medical History:  Diagnosis Date  . ADD (attention deficit disorder)    adult- per psych  . Adenomatous colon polyp   . Allergy   . Cancer ([place])    basal cell skin cancer   . Diverticulitis    Perforated sigmoid diverticulitis   . Fall 4-10   fx left hip and wrist  . Heart murmur    Echo 2003 "thickened and somehow myomateous MV, mild MR"  . HOH (hard of hearing)   . Hydrocele 2016   Large, symptomatic left hydrocele, planned hydrocelectomy in 10/2015 with Dr. Diona Fanti  . Insomnia    per psych  . Wears glasses     Past Surgical History:  Procedure Laterality Date  . HIP SURGERY Left 2011   ORIF femur  . HYDROCELE EXCISION Left 11/02/2015   Procedure: HYDROCELECTOMY ADULT;  Surgeon: Franchot Gallo, MD;  Location: [facility];  Service: Urology;  Laterality: Left;  . LAPAROSCOPIC SIGMOID COLECTOMY N/A 02/11/2013   Procedure: LAPAROSCOPIC SIGMOID COLECTOMY WITH MOBILIZATION SPLENIC FLEXURE;  Surgeon: Madilyn Hook, DO;  Location: [facility];  Service: General;  Laterality: N/A;  . nose biopsy  2011   Dr Allyson Sabal, no cancer  . POLYPECTOMY    . WRIST SURGERY  2011   left    Allergies as of 02/07/2020   No Known Allergies     Medication List       Accurate as of February 07, 2020  3:34 PM. If you have any questions, ask your nurse or doctor.        Adderall 10 MG tablet Generic drug: amphetamine-dextroamphetamine Take 1 tablet (10 mg total) by mouth daily as needed.   amphetamine-dextroamphetamine 30 MG 24 hr capsule Commonly known  as: ADDERALL XR Take 1 capsule (30 mg total) by mouth every morning.   azelastine 0.1 % nasal spray Commonly known as: ASTELIN Place 2 sprays into both nostrils at bedtime as needed for rhinitis or allergies.   Centrum Ultra Mens Tabs Take by mouth. Three times a week--Monday, Wednesday, Friday   clindamycin 1 % gel Commonly known as: CLINDAGEL Apply topically 2 (two) times daily as needed.   cromolyn 5.2 MG/ACT nasal spray Commonly known as: NasalCrom Place 1 spray into both nostrils 3 (three) times daily.   diclofenac sodium 1 % Gel Commonly known as: VOLTAREN Apply 4 g topically 4 (four) times daily as needed.   olopatadine 0.1 % ophthalmic solution Commonly known as: PATANOL Place 1 drop into both eyes 2 (two) times daily.   traZODone 100 MG tablet Commonly known as: DESYREL Take 50 mg by mouth at bedtime.          Objective:   Physical Exam BP (!) 156/85 (BP Location: Left Arm, Patient Position: Sitting, Cuff Size: Normal)   Pulse 91   Temp (!) 96.6 F (35.9 C) (Temporal)   Resp 16   Ht 6' (1.829 m)   Wt 182 lb 8 oz (82.8 kg)   SpO2 100%   BMI 24.75 kg/m  General:  Well developed, NAD, BMI noted. HEENT:  Normocephalic . Face symmetric, atraumatic  Skin: Not pale. Not jaundice Neurologic:  alert & oriented X3.  Speech normal, gait appropriate for age and unassisted Psych--  Cognition and judgment appear intact.  Cooperative with normal attention span and concentration.  Behavior appropriate. No anxious or depressed appearing.      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  Like to transfer she is prescriptions  here, so far we have not been able to get records from the [place] counseling office.  I agree to start prescribing  Adderall  XR 30 mg one p.o. daily pending records. ROI signed, will get records from [place] counseling.  Call for refills as needed Palpitations: Since the last visit, symptoms resolved, on looking back the patient is almost certain this was related to stress.  Echo was normal.  No further evaluation needed at this time. Stress: Under a lot of stress, work-related, thinking about retirement soon.  Listening therapy provided. ED: New issue, we agree on prescribe sildenafil to be used as needed, side effects and how to use it discussed with the patient. FH macular degeneration, persistent watery eyes: Would like to see a different ophthalmologist, referred to Dr. Bing Plume office. Preventive care: To get the second Covid shot soon, recommend to RTC to this office for Shingrix # 2 in 6 weeks. RTC nurse visit 6 weeks Shingrix RTC CPX 09-2020  This visit occurred during the SARS-CoV-2 public health emergency.  Safety protocols were in place, including screening questions prior to the visit, additional usage of staff PPE, and extensive cleaning of exam room while observing appropriate contact time as indicated for disinfecting solutions.

## 2020-02-08 ENCOUNTER — Telehealth: Payer: Self-pay

## 2020-02-08 NOTE — Assessment & Plan Note (Signed)
ADD.  Like to transfer she is prescriptions here, so far we have not been able to get records from the [place] counseling office.  I agree to start prescribing  Adderall  XR 30 mg one p.o. daily pending records. ROI signed, will get records from [place] counseling.  Call for refills as needed Palpitations: Since the last visit, symptoms resolved, on looking back the patient is almost certain this was related to stress.  Echo was normal.  No further evaluation needed at this time. Stress: Under a lot of stress, work-related, thinking about retirement soon.  Listening therapy provided. ED: New issue, we agree on prescribe sildenafil to be used as needed, side effects and how to use it discussed with the patient. FH macular degeneration, persistent watery eyes: Would like to see a different ophthalmologist, referred to Dr. Bing Plume office. Preventive care: To get the second Covid shot soon, recommend to RTC to this office for Shingrix # 2 in 6 weeks. RTC nurse visit 6 weeks Shingrix RTC CPX 09-2020

## 2020-02-08 NOTE — Telephone Encounter (Signed)
ROI faxed to [facility] at [phone]. ROI sent for scanning. Awaiting records.

## 2020-02-09 ENCOUNTER — Telehealth: Payer: Self-pay

## 2020-02-09 NOTE — Telephone Encounter (Signed)
Received PA form from [place]- form completed and faxed back to [phone]. Awaiting determination.

## 2020-02-10 NOTE — Telephone Encounter (Signed)
PA denied. Plan exclusion.  

## 2020-02-29 NOTE — Telephone Encounter (Signed)
Records received- placed in MD red folder for review.

## 2020-03-01 ENCOUNTER — Encounter: Payer: Self-pay | Admitting: Internal Medicine

## 2020-03-01 NOTE — Telephone Encounter (Signed)
Records from the [place] counseling center reviewed. He has a long history of ADHD and was  a patient there for over 20 years. They confirm he carries a diagnosis of ADHD, insomnia, anxiety. They prescribed Adderall XR 30 mg daily and Adderall 10 mg for the afternoon if needed. They also prescribed trazodone for insomnia. I will notify the patient that records were received and will refill his medications when needed.

## 2020-03-09 ENCOUNTER — Telehealth: Payer: Self-pay | Admitting: Internal Medicine

## 2020-03-09 MED ORDER — AMPHETAMINE-DEXTROAMPHETAMINE 10 MG PO TABS: 10.0000 mg | ORAL_TABLET | Freq: Every day | ORAL | 0 refills | Status: DC | PRN

## 2020-03-09 MED ORDER — AMPHETAMINE-DEXTROAMPHET ER 30 MG PO CP24: 30.0000 mg | ORAL_CAPSULE | ORAL | 0 refills | Status: DC

## 2020-03-09 NOTE — Telephone Encounter (Signed)
PDMP okay, prescriptions sent 

## 2020-03-09 NOTE — Telephone Encounter (Signed)
Adderall refill   Last OV: 02/07/2020 Last Fill for both 30mg  and 10mg : 02/07/2020 #30 and 0RF UDS: None yet  Needs UDS at next OV

## 2020-03-09 NOTE — Telephone Encounter (Signed)
Medication:amphetamine-dextroamphetamine (ADDERALL XR) 30 MG 24 hr capsule [id number]    Has the patient contacted their pharmacy? No. (If no, request that the patient contact the pharmacy for the refill.) (If yes, when and what did the pharmacy advise?)  Preferred Pharmacy (with phone number or street name): [facility] [place], [address]  [place], [place] [address]  Phone:  [phone] Fax:  [phone]  DEA #:  [id number]       Agent: Please be advised that RX refills may take up to 3 business days. We ask that you follow-up with your pharmacy.

## 2020-03-16 ENCOUNTER — Telehealth: Payer: Self-pay

## 2020-03-16 MED ORDER — AMPHETAMINE-DEXTROAMPHETAMINE 10 MG PO TABS: 10.0000 mg | ORAL_TABLET | Freq: Every day | ORAL | 0 refills | Status: DC | PRN

## 2020-03-16 MED ORDER — AMPHETAMINE-DEXTROAMPHET ER 30 MG PO CP24: 30.0000 mg | ORAL_CAPSULE | ORAL | 0 refills | Status: DC

## 2020-03-16 NOTE — Telephone Encounter (Signed)
Can you re-send please

## 2020-03-16 NOTE — Telephone Encounter (Signed)
Re-send

## 2020-03-16 NOTE — Telephone Encounter (Signed)
Pt called stating [facility] at [employer] [facility] [place] did not receive the refill requests on his (2) Adderall prescriptions that were escribed on 03/09/20.  Please send them over again for the pt.

## 2020-03-21 ENCOUNTER — Other Ambulatory Visit: Payer: Self-pay

## 2020-03-21 ENCOUNTER — Ambulatory Visit (INDEPENDENT_AMBULATORY_CARE_PROVIDER_SITE_OTHER): Payer: 59

## 2020-03-21 DIAGNOSIS — Z23 Encounter for immunization: Secondary | ICD-10-CM | POA: Diagnosis not present

## 2020-03-21 NOTE — Progress Notes (Signed)
Patient here today for shingles vaccine. 0.81mL given in left deltoid. Patient tolerated well. VIS given.

## 2020-03-28 ENCOUNTER — Telehealth: Payer: Self-pay

## 2020-03-28 MED ORDER — TRAZODONE HCL 100 MG PO TABS: 50.0000 mg | ORAL_TABLET | Freq: Every day | ORAL | 5 refills | Status: DC

## 2020-03-28 NOTE — Telephone Encounter (Signed)
Received refill request for trazodone 100mg : 1/2 to 1 tab qhs   I don't see where you have ever prescribed this medication. Okay to refill?

## 2020-03-28 NOTE — Telephone Encounter (Signed)
I agreed to take over medications prescribed by the [facility] counseling office.  Rx sent.

## 2020-04-18 ENCOUNTER — Telehealth: Payer: Self-pay | Admitting: Internal Medicine

## 2020-04-18 MED ORDER — AMPHETAMINE-DEXTROAMPHETAMINE 10 MG PO TABS: 10.0000 mg | ORAL_TABLET | Freq: Every day | ORAL | 0 refills | Status: DC | PRN

## 2020-04-18 MED ORDER — AMPHETAMINE-DEXTROAMPHET ER 30 MG PO CP24: 30.0000 mg | ORAL_CAPSULE | ORAL | 0 refills | Status: DC

## 2020-04-18 NOTE — Telephone Encounter (Signed)
rx sent

## 2020-04-18 NOTE — Telephone Encounter (Signed)
Adderall refill.   Last Ov: 02/07/2020 Last Fill on Adderall XR 30mg : 03/16/2020 #30 and 0RF Last Fill on Adderall 10mg : 03/16/2020 #30 and 0RF UDS: None  NEEDS UDS AT NEXT OV

## 2020-04-18 NOTE — Telephone Encounter (Signed)
Medication:  amphetamine-dextroamphetamine (ADDERALL) 10 MG tablet    amphetamine-dextroamphetamine (ADDERALL XR) 30 MG 24 hr capsule   Has the patient contacted their pharmacy?  (If no, request that the patient contact the pharmacy for the refill.) (If yes, when and what did the pharmacy advise?)     Preferred Pharmacy (with phone number or street name): [facility] [place], [address]  [place], [place] [address]  Phone:  [phone] Fax:  [phone]     Agent: Please be advised that RX refills may take up to 3 business days. We ask that you follow-up with your pharmacy.

## 2020-04-25 ENCOUNTER — Other Ambulatory Visit: Payer: Self-pay | Admitting: Internal Medicine

## 2020-05-24 ENCOUNTER — Telehealth: Payer: Self-pay | Admitting: Internal Medicine

## 2020-05-24 NOTE — Telephone Encounter (Signed)
Medication: amphetamine-dextroamphetamine (ADDERALL XR) 30 MG 24 hr capsule [[id number]  amphetamine-dextroamphetamine (ADDERALL) 10 MG tablet [[id number]]    Has the patient contacted their pharmacy? No. (If no, request that the patient contact the pharmacy for the refill.) (If yes, when and what did the pharmacy advise?)  Preferred Pharmacy (with phone number or street name):   [facility] [place], [address]  [place], [place] [address]  Phone:  [phone] Fax:  [phone]  DEA #:  [id number] Agent: Please be advised that RX refills may take up to 3 business days. We ask that you follow-up with your pharmacy.

## 2020-05-24 NOTE — Telephone Encounter (Signed)
Adderall XR 30mg  and Adderall 10mg  refill.   Last OV:  Last Fill on Adderall XR 30: 04/18/2020 #30 and 0RF Last Fill on Adderall 10mg : 04/18/2020 #30 and 0RF UDS: None  Needs UDS at next OV.

## 2020-05-25 MED ORDER — AMPHETAMINE-DEXTROAMPHET ER 30 MG PO CP24: 30.0000 mg | ORAL_CAPSULE | ORAL | 0 refills | Status: DC

## 2020-05-25 MED ORDER — AMPHETAMINE-DEXTROAMPHETAMINE 10 MG PO TABS: 10.0000 mg | ORAL_TABLET | Freq: Every day | ORAL | 0 refills | Status: DC | PRN

## 2020-05-25 NOTE — Telephone Encounter (Signed)
PDMP okay, Rx sent x2 

## 2020-06-20 ENCOUNTER — Telehealth: Payer: Self-pay | Admitting: Internal Medicine

## 2020-06-20 MED ORDER — AMPHETAMINE-DEXTROAMPHETAMINE 10 MG PO TABS: 10.0000 mg | ORAL_TABLET | Freq: Every day | ORAL | 0 refills | Status: DC | PRN

## 2020-06-20 MED ORDER — AMPHETAMINE-DEXTROAMPHET ER 30 MG PO CP24: 30.0000 mg | ORAL_CAPSULE | ORAL | 0 refills | Status: DC

## 2020-06-20 NOTE — Telephone Encounter (Signed)
Adderall refill.   Last OV: 02/07/2020 Last Fill on Adderall XR 30mg :05/25/2020 #30 and 0RF Last Fill on Adderall 10mg : 05/25/2020 #30 and 0RF UDS: None

## 2020-06-20 NOTE — Telephone Encounter (Signed)
PDMP okay, Rx sent 

## 2020-06-20 NOTE — Telephone Encounter (Signed)
Medication: amphetamine-dextroamphetamine (ADDERALL XR) 30 MG 24 hr capsule [[id number]]    Has the patient contacted their pharmacy? No. (If no, request that the patient contact the pharmacy for the refill.) (If yes, when and what did the pharmacy advise?)  Preferred Pharmacy (with phone number or street name): [facility] [place], [address]  [place], [place] [address]  Phone:  [phone] Fax:  [phone]  DEA #:  [id number]  Agent: Please be advised that RX refills may take up to 3 business days. We ask that you follow-up with your pharmacy.

## 2020-07-25 ENCOUNTER — Telehealth: Payer: Self-pay | Admitting: Internal Medicine

## 2020-07-25 MED ORDER — AMPHETAMINE-DEXTROAMPHET ER 30 MG PO CP24: 30.0000 mg | ORAL_CAPSULE | ORAL | 0 refills | Status: DC

## 2020-07-25 MED ORDER — AMPHETAMINE-DEXTROAMPHETAMINE 10 MG PO TABS: 10.0000 mg | ORAL_TABLET | Freq: Every day | ORAL | 0 refills | Status: DC | PRN

## 2020-07-25 NOTE — Telephone Encounter (Signed)
Adderall 10mg  and 30mg  XR refill.   Last OV: 02/07/2020, appt scheduled 09/19/2020 Last Fill on Adderall 10mg :  Last Fill on Adderall 30mg  XR:  UDS: None  Will need UDS at next OV

## 2020-07-25 NOTE — Telephone Encounter (Signed)
Medication:amphetamine-dextroamphetamine (ADDERALL) 10 MG tablet  amphetamine-dextroamphetamine (ADDERALL XR) 30 MG 24 hr capsul   Has the patient contacted their pharmacy? No. (If no, request that the patient contact the pharmacy for the refill.) (If yes, when and what did the pharmacy advise?)  Preferred Pharmacy (with phone number or street name):   [facility] [place], [address]  [place], [place] [address]  Phone:  [phone] Fax:  [phone]   Agent: Please be advised that RX refills may take up to 3 business days. We ask that you follow-up with your pharmacy.

## 2020-07-25 NOTE — Telephone Encounter (Signed)
Prescription sent

## 2020-08-07 ENCOUNTER — Other Ambulatory Visit: Payer: Self-pay | Admitting: Internal Medicine

## 2020-08-29 ENCOUNTER — Telehealth: Payer: Self-pay | Admitting: Internal Medicine

## 2020-08-29 MED ORDER — AMPHETAMINE-DEXTROAMPHETAMINE 10 MG PO TABS
10.0000 mg | ORAL_TABLET | Freq: Every day | ORAL | 0 refills | Status: DC | PRN
Start: 2020-08-29 — End: 2020-10-18

## 2020-08-29 MED ORDER — AMPHETAMINE-DEXTROAMPHET ER 30 MG PO CP24
30.0000 mg | ORAL_CAPSULE | ORAL | 0 refills | Status: DC
Start: 2020-08-29 — End: 2020-10-18

## 2020-08-29 NOTE — Telephone Encounter (Signed)
Requesting: Adderall 10mg  and Adderall XR 30mg  Contract: 10/12/2019 UDS: None, needs at next OV Last Visit: 02/07/2020 Next Visit: appt scheduled for 09/19/2020 Last Refill: 07/25/2020 #30 and 0RF  Please Advise

## 2020-08-29 NOTE — Telephone Encounter (Signed)
PDMP okay prescription sent

## 2020-08-29 NOTE — Telephone Encounter (Signed)
Medication:  amphetamine-dextroamphetamine (ADDERALL XR) 30 MG 24 hr capsule [[id number]]    amphetamine-dextroamphetamine (ADDERALL) 10 MG tablet [[id number]]    Has the patient contacted their pharmacy?  (If no, request that the patient contact the pharmacy for the refill.) (If yes, when and what did the pharmacy advise?)     Preferred Pharmacy (with phone number or street name):  [facility] [place], [address]  [place], [place] [address]  Phone:  [phone] Fax:  [phone]     Agent: Please be advised that RX refills may take up to 3 business days. We ask that you follow-up with your pharmacy.

## 2020-09-11 ENCOUNTER — Telehealth: Payer: Self-pay | Admitting: Internal Medicine

## 2020-09-11 NOTE — Telephone Encounter (Signed)
Patient states that his pharmacy did not received prescription for medication. amphetamine-dextroamphetamine (ADDERALL XR) 30 MG 24 hr capsule [[id number]]

## 2020-09-11 NOTE — Telephone Encounter (Signed)
Spoke w/ [facility]- they received both refills on 08/29/20 for Adderall XR 30mg  and Adderall 10mg . She will get them ready for him.

## 2020-09-19 ENCOUNTER — Encounter: Payer: Self-pay | Admitting: Internal Medicine

## 2020-09-19 ENCOUNTER — Other Ambulatory Visit: Payer: Self-pay

## 2020-09-19 ENCOUNTER — Ambulatory Visit (INDEPENDENT_AMBULATORY_CARE_PROVIDER_SITE_OTHER): Payer: Medicare Other | Admitting: Internal Medicine

## 2020-09-19 VITALS — BP 141/93 | HR 74 | Temp 98.2°F | Resp 16 | Ht 72.0 in | Wt 182.2 lb

## 2020-09-19 DIAGNOSIS — Z23 Encounter for immunization: Secondary | ICD-10-CM | POA: Diagnosis not present

## 2020-09-19 DIAGNOSIS — Z79899 Other long term (current) drug therapy: Secondary | ICD-10-CM

## 2020-09-19 DIAGNOSIS — F988 Other specified behavioral and emotional disorders with onset usually occurring in childhood and adolescence: Secondary | ICD-10-CM | POA: Diagnosis not present

## 2020-09-19 DIAGNOSIS — N529 Male erectile dysfunction, unspecified: Secondary | ICD-10-CM

## 2020-09-19 MED ORDER — SILDENAFIL CITRATE 20 MG PO TABS
ORAL_TABLET | ORAL | 1 refills | Status: DC
Start: 2020-09-19 — End: 2020-10-09

## 2020-09-19 NOTE — Progress Notes (Signed)
Pre visit review using our clinic review tool, if applicable. No additional management support is needed unless otherwise documented below in the visit note. 

## 2020-09-19 NOTE — Patient Instructions (Addendum)
Check the  blood pressure 2 or 3 times a month / week, monthly weekly  Be sure your blood pressure is between 110/65 and  145/85.  if it is consistently higher or lower, let me know  To find a good quality blood pressure cuff: [email or web address]   GO TO THE LAB : Provide a urine sample   GO TO THE FRONT DESK, PLEASE SCHEDULE YOUR APPOINTMENTS Come back for a physical exam in 6 months, fasting

## 2020-09-19 NOTE — Progress Notes (Signed)
Subjective:    Patient ID: [name], male    DOB: 08/15/1956, 64 y.o.   MRN: [id number]  DOS:  09/19/2020 Type of visit - description: Routine visit Since the last office visit he is doing well, he retired, having much less stress.  Wife reports that sometimes he kicks during his dreams which is something the patient has done on and off for years.  BP slightly elevated, no ambulatory BPs   BP Readings from Last 3 Encounters:  09/19/20 (!) 141/93  02/07/20 (!) 156/85  10/12/19 (!) 141/87     Review of Systems See above   Past Medical History:  Diagnosis Date  . ADD (attention deficit disorder)    adult- per psych  . Adenomatous colon polyp   . Allergy   . Cancer ([place])    basal cell skin cancer   . Diverticulitis    Perforated sigmoid diverticulitis   . Fall 4-10   fx left hip and wrist  . Heart murmur    Echo 2003 "thickened and somehow myomateous MV, mild MR"  . HOH (hard of hearing)   . Hydrocele 2016   Large, symptomatic left hydrocele, planned hydrocelectomy in 10/2015 with Dr. Diona Fanti  . Insomnia    per psych  . Wears glasses     Past Surgical History:  Procedure Laterality Date  . HIP SURGERY Left 2011   ORIF femur  . HYDROCELE EXCISION Left 11/02/2015   Procedure: HYDROCELECTOMY ADULT;  Surgeon: Franchot Gallo, MD;  Location: [facility];  Service: Urology;  Laterality: Left;  . LAPAROSCOPIC SIGMOID COLECTOMY N/A 02/11/2013   Procedure: LAPAROSCOPIC SIGMOID COLECTOMY WITH MOBILIZATION SPLENIC FLEXURE;  Surgeon: Madilyn Hook, DO;  Location: [facility];  Service: General;  Laterality: N/A;  . nose biopsy  2011   Dr Allyson Sabal, no cancer  . POLYPECTOMY    . WRIST SURGERY  2011   left    Allergies as of 09/19/2020   No Known Allergies     Medication List       Accurate as of September 19, 2020  9:48 PM. If you have any questions, ask your nurse or doctor.        STOP taking these medications   cromolyn 5.2 MG/ACT nasal  spray Commonly known as: NasalCrom Stopped by: Kathlene November, MD   olopatadine 0.1 % ophthalmic solution Commonly known as: PATANOL Stopped by: Kathlene November, MD     TAKE these medications   amphetamine-dextroamphetamine 30 MG 24 hr capsule Commonly known as: ADDERALL XR Take 1 capsule (30 mg total) by mouth every morning.   amphetamine-dextroamphetamine 10 MG tablet Commonly known as: Adderall Take 1 tablet (10 mg total) by mouth daily as needed.   azelastine 0.1 % nasal spray Commonly known as: ASTELIN Place 2 sprays into both nostrils at bedtime as needed for rhinitis or allergies.   Centrum Ultra Mens Tabs Take by mouth. Three times a week--Monday, Wednesday, Friday   clindamycin 1 % gel Commonly known as: CLINDAGEL Apply topically 2 (two) times daily as needed.   diclofenac sodium 1 % Gel Commonly known as: VOLTAREN Apply 4 g topically 4 (four) times daily as needed.   sildenafil 20 MG tablet Commonly known as: REVATIO TAKE 3-4 TABLETS BY MOUTH AT BEDTIME AS NEEDED   traZODone 100 MG tablet Commonly known as: DESYREL Take 0.5-1 tablets (50-100 mg total) by mouth at bedtime.          Objective:   Physical Exam BP [name])  141/93 (BP Location: Left Arm, Patient Position: Sitting, Cuff Size: Normal)   Pulse 74   Temp 98.2 F (36.8 C) (Oral)   Resp 16   Ht 6' (1.829 m)   Wt 182 lb 4 oz (82.7 kg)   SpO2 98%   BMI 24.72 kg/m   General:   Well developed, NAD, BMI noted. HEENT:  Normocephalic . Face symmetric, atraumatic Lungs:  CTA B Normal respiratory effort, no intercostal retractions, no accessory muscle use. Heart: RRR,  no murmur.  Lower extremities: no pretibial edema bilaterally  Skin: Not pale. Not jaundice Neurologic:  alert & oriented X3.  Speech normal, gait appropriate for age and unassisted Psych--  Cognition and judgment appear intact.  Cooperative with normal attention span and concentration.  Behavior appropriate. No anxious or depressed  appearing.      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  He agrees. RF as needed, check UDS Sleep disturbance: Is the patient's wife observation that he kicking more at night, does not seem to be a major problem for now, we agreed on observation.  Continue trazodone for insomnia. BP slightly elevated, recommend to check at home.  Goals provided, last lab normal.  Recheck on RTC.  ED: Sildenafil worked, RF sent Watery eyes: See last visit, saw ophthalmology, had lacrimal duct procedure at his better.  Preventive care: Flu shot and Prevnar today S/p Shingrix x 3 RTC 6 months CPX fasting   This visit occurred during the SARS-CoV-2 public health emergency.  Safety protocols were in place, including screening questions prior to the visit, additional usage of staff PPE, and extensive cleaning of exam room while observing appropriate contact time as indicated for disinfecting solutions.

## 2020-09-19 NOTE — Assessment & Plan Note (Signed)
ADD: He recently retired, less stress, we discussed the fact that it is very likely he will need less Adderall going forward.  He agrees. RF as needed, check UDS Sleep disturbance: Is the patient's wife observation that he kicking more at night, does not seem to be a major problem for now, we agreed on observation.  Continue trazodone for insomnia. BP slightly elevated, recommend to check at home.  Goals provided, last lab normal.  Recheck on RTC.  ED: Sildenafil worked, RF sent Watery eyes: See last visit, saw ophthalmology, had lacrimal duct procedure at his better.  Preventive care: Flu shot and Prevnar today S/p Shingrix x 3 RTC 6 months CPX fasting

## 2020-09-21 LAB — DRUG MONITORING, PANEL 8 WITH CONFIRMATION, URINE
6 Acetylmorphine: NEGATIVE ng/mL (ref <10)
Alcohol Metabolites: POSITIVE ng/mL — AB
Amphetamine: 1318 ng/mL — ABNORMAL HIGH (ref <250)
Amphetamines: POSITIVE ng/mL — AB (ref <500)
Benzodiazepines: NEGATIVE ng/mL (ref <100)
Buprenorphine, Urine: NEGATIVE ng/mL (ref <5)
Cocaine Metabolite: NEGATIVE ng/mL (ref <150)
Creatinine: 227.4 mg/dL
Ethyl Glucuronide (ETG): 22825 ng/mL — ABNORMAL HIGH (ref <500)
Ethyl Sulfate (ETS): 6067 ng/mL — ABNORMAL HIGH (ref <100)
MDMA: NEGATIVE ng/mL (ref <500)
Marijuana Metabolite: NEGATIVE ng/mL (ref <20)
Methamphetamine: NEGATIVE ng/mL (ref <250)
Opiates: NEGATIVE ng/mL (ref <100)
Oxidant: NEGATIVE ug/mL
Oxycodone: NEGATIVE ng/mL (ref <100)
pH: 6.4 (ref 4.5–9.0)

## 2020-09-21 LAB — DM TEMPLATE

## 2020-09-26 ENCOUNTER — Telehealth: Payer: Self-pay

## 2020-09-26 NOTE — Telephone Encounter (Signed)
Received fax notification from [facility] that PA is needed for sildenafil. Requesting BIN, PCN and Rxgroup numbers from them if they are available.

## 2020-10-08 ENCOUNTER — Other Ambulatory Visit: Payer: Self-pay | Admitting: Internal Medicine

## 2020-10-18 ENCOUNTER — Telehealth: Payer: Self-pay | Admitting: Internal Medicine

## 2020-10-18 MED ORDER — AMPHETAMINE-DEXTROAMPHET ER 30 MG PO CP24
30.0000 mg | ORAL_CAPSULE | ORAL | 0 refills | Status: DC
Start: 2020-10-18 — End: 2020-11-27

## 2020-10-18 MED ORDER — AMPHETAMINE-DEXTROAMPHETAMINE 10 MG PO TABS
10.0000 mg | ORAL_TABLET | Freq: Every day | ORAL | 0 refills | Status: DC | PRN
Start: 2020-10-18 — End: 2020-11-27

## 2020-10-18 NOTE — Telephone Encounter (Signed)
PDMP okay, Rx sent 

## 2020-10-18 NOTE — Telephone Encounter (Signed)
Requesting: Adderall 10mg  and Adderall XR 30mg  Contract: 10/12/2019 UDS: 09/19/2020 Last Visit: 09/19/2020 Next Visit: None scheduled Last Refill on Adderall 10mg : 08/29/2020 #30 and 0RF Last Refill on Adderall XR 30mg : 08/29/2020 #30 and 0RF  Please Advise

## 2020-10-18 NOTE — Telephone Encounter (Signed)
Medication:  amphetamine-dextroamphetamine (ADDERALL XR) 30 MG 24 hr capsule [[id number]]   amphetamine-dextroamphetamine (ADDERALL) 10 MG tablet [[id number]]      Has the patient contacted their pharmacy?  (If no, request that the patient contact the pharmacy for the refill.) (If yes, when and what did the pharmacy advise?)     Preferred Pharmacy (with phone number or street name):  [facility] [place], [address]  [place], [place] [address]  Phone:  [phone] Fax:  [phone]     Agent: Please be advised that RX refills may take up to 3 business days. We ask that you follow-up with your pharmacy.

## 2020-10-30 ENCOUNTER — Telehealth: Payer: Self-pay | Admitting: Internal Medicine

## 2020-10-30 ENCOUNTER — Telehealth (INDEPENDENT_AMBULATORY_CARE_PROVIDER_SITE_OTHER): Payer: Medicare Other | Admitting: Internal Medicine

## 2020-10-30 ENCOUNTER — Encounter: Payer: Self-pay | Admitting: Internal Medicine

## 2020-10-30 ENCOUNTER — Other Ambulatory Visit: Payer: Self-pay

## 2020-10-30 VITALS — Ht 72.0 in | Wt 185.0 lb

## 2020-10-30 DIAGNOSIS — U071 COVID-19: Secondary | ICD-10-CM | POA: Diagnosis not present

## 2020-10-30 NOTE — Telephone Encounter (Signed)
Recommend to schedule a virtual visit w/ PCP please.

## 2020-10-30 NOTE — Progress Notes (Signed)
Subjective:    Patient ID: [name], male    DOB: [birth year], 64 y.o.   MRN: [id number]  DOS:  10/30/2020 Type of visit - description: Virtual Visit via Telephone    I connected with above mentioned patient  by telephone and verified that I am speaking with the correct person using two identifiers.  THIS ENCOUNTER IS A VIRTUAL VISIT DUE TO COVID-19 - PATIENT WAS NOT SEEN IN THE OFFICE. PATIENT HAS CONSENTED TO VIRTUAL VISIT / TELEMEDICINE VISIT   Location of patient: home  Location of provider: office  Persons participating in the virtual visit: patient, provider   I discussed the limitations, risks, security and privacy concerns of performing an evaluation and management service by telephone and the availability of in person appointments. I also discussed with the patient that there may be a patient responsible charge related to this service. The patient expressed understanding and agreed to proceed.  Acute The patient went to [place] to visit his son, he flew back yesterday. Shortly after he developed a "head cold" characterized by sinus congestion, postnasal dripping. This morning, he did a Covid home test and it came back +. His wife [name] tested negative.  Denies fever chills. He does feel achy. Has some sore throat. No chest pain no difficulty breathing No nausea, vomiting, diarrhea.   Review of Systems See above   Past Medical History:  Diagnosis Date  . ADD (attention deficit disorder)    adult- per psych  . Adenomatous colon polyp   . Allergy   . Cancer ([place])    basal cell skin cancer   . Diverticulitis    Perforated sigmoid diverticulitis   . Fall 4-10   fx left hip and wrist  . Heart murmur    Echo 2003 "thickened and somehow myomateous MV, mild MR"  . HOH (hard of hearing)   . Hydrocele 2016   Large, symptomatic left hydrocele, planned hydrocelectomy in 10/2015 with Dr. Diona Fanti  . Insomnia    per psych  . Wears glasses     Past Surgical  History:  Procedure Laterality Date  . HIP SURGERY Left 2011   ORIF femur  . HYDROCELE EXCISION Left 11/02/2015   Procedure: HYDROCELECTOMY ADULT;  Surgeon: Franchot Gallo, MD;  Location: [facility];  Service: Urology;  Laterality: Left;  . LAPAROSCOPIC SIGMOID COLECTOMY N/A 02/11/2013   Procedure: LAPAROSCOPIC SIGMOID COLECTOMY WITH MOBILIZATION SPLENIC FLEXURE;  Surgeon: Madilyn Hook, DO;  Location: [facility];  Service: General;  Laterality: N/A;  . nose biopsy  2011   Dr Allyson Sabal, no cancer  . POLYPECTOMY    . WRIST SURGERY  2011   left    Allergies as of 10/30/2020   No Known Allergies     Medication List       Accurate as of October 30, 2020  4:11 PM. If you have any questions, ask your nurse or doctor.        amphetamine-dextroamphetamine 10 MG tablet Commonly known as: Adderall Take 1 tablet (10 mg total) by mouth daily as needed.   amphetamine-dextroamphetamine 30 MG 24 hr capsule Commonly known as: ADDERALL XR Take 1 capsule (30 mg total) by mouth every morning.   azelastine 0.1 % nasal spray Commonly known as: ASTELIN Place 2 sprays into both nostrils at bedtime as needed for rhinitis or allergies.   Centrum Ultra Mens Tabs Take by mouth. Three times a week--Monday, Wednesday, Friday   clindamycin 1 % gel Commonly known as:  CLINDAGEL Apply topically 2 (two) times daily as needed.   diclofenac sodium 1 % Gel Commonly known as: VOLTAREN Apply 4 g topically 4 (four) times daily as needed.   sildenafil 20 MG tablet Commonly known as: REVATIO TAKE 3 TO 4 TABLETS BY MOUTH AT BEDTIME AS NEEDED   traZODone 100 MG tablet Commonly known as: DESYREL Take 0.5-1 tablets (50-100 mg total) by mouth at bedtime.          Objective:   Physical Exam Ht 6' (1.829 m)   Wt 185 lb (83.9 kg)   BMI 25.09 kg/m  This is a telephone assessment, unable to communicate via video.  He sounded well, no cough noted, no difficulty breathing.    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  A home test was positive today. My recommendation is to confirm the diagnosis with a PCR test and let me know the results. If PCR test is positive and he is not feeling better will refer him for monoclonal antibody consideration.  We have a 10-day went off opportunity. In the meantime recommend to be treated as URI with rest, fluids, nasal sprays, Robitussin or Mucinex. He verbalized understanding. He will let me know if his not improving soon.      I discussed the assessment and treatment plan with the patient. The patient was provided an opportunity to ask questions and all were answered. The patient agreed with the plan and demonstrated an understanding of the instructions.   The patient was advised to call back or seek an in-person evaluation if the symptoms worsen or if the condition fails to improve as anticipated.  I provided 21 minutes of non-face-to-face time during this encounter.  Kathlene November, MD

## 2020-10-30 NOTE — Progress Notes (Signed)
Pre visit review using our clinic review tool, if applicable. No additional management support is needed unless otherwise documented below in the visit note. 

## 2020-10-30 NOTE — Telephone Encounter (Signed)
Patient states he tested positive for covid 19 this morning with an at home covid test. Patient states his wife is negative and having no symptoms.  Patient states he would like to Dr Ethel Rana recommendations.

## 2020-10-31 ENCOUNTER — Encounter: Payer: Self-pay | Admitting: Physician Assistant

## 2020-10-31 NOTE — Assessment & Plan Note (Signed)
COVID-19: The patient is a healthy 64 year old gentleman, he had 3 Covid vaccines, has his flu shot about 6 weeks ago;  developed sinus congestion and achiness yesterday.  A home test was positive today. My recommendation is to confirm the diagnosis with a PCR test and let me know the results. If PCR test is positive and he is not feeling better will refer him for monoclonal antibody consideration.  We have a 10-day went off opportunity. In the meantime recommend to be treated as URI with rest, fluids, nasal sprays, Robitussin or Mucinex. He verbalized understanding. He will let me know if his not improving soon.

## 2020-11-01 ENCOUNTER — Other Ambulatory Visit: Payer: Self-pay | Admitting: Physician Assistant

## 2020-11-01 ENCOUNTER — Ambulatory Visit (HOSPITAL_COMMUNITY)
Admission: RE | Admit: 2020-11-01 | Discharge: 2020-11-01 | Disposition: A | Discharge status: Home / Self Care | Payer: Medicare Other | Source: Ambulatory Visit | Attending: Pulmonary Disease | Admitting: Pulmonary Disease

## 2020-11-01 DIAGNOSIS — U071 COVID-19: Secondary | ICD-10-CM

## 2020-11-01 DIAGNOSIS — Z23 Encounter for immunization: Secondary | ICD-10-CM | POA: Diagnosis not present

## 2020-11-01 DIAGNOSIS — Z6825 Body mass index (BMI) 25.0-25.9, adult: Secondary | ICD-10-CM

## 2020-11-01 MED ORDER — SODIUM CHLORIDE 0.9 % IV SOLN: Freq: Once | INTRAVENOUS | Status: AC

## 2020-11-01 MED ORDER — FAMOTIDINE IN NACL 20-0.9 MG/50ML-% IV SOLN: 20.0000 mg | Freq: Once | INTRAVENOUS | Status: DC | PRN

## 2020-11-01 MED ORDER — DIPHENHYDRAMINE HCL 50 MG/ML IJ SOLN: 50.0000 mg | Freq: Once | INTRAMUSCULAR | Status: DC | PRN

## 2020-11-01 MED ORDER — METHYLPREDNISOLONE SODIUM SUCC 125 MG IJ SOLR: 125.0000 mg | Freq: Once | INTRAMUSCULAR | Status: DC | PRN

## 2020-11-01 MED ORDER — ALBUTEROL SULFATE HFA 108 (90 BASE) MCG/ACT IN AERS: 2.0000 | INHALATION_SPRAY | Freq: Once | RESPIRATORY_TRACT | Status: DC | PRN

## 2020-11-01 MED ORDER — SODIUM CHLORIDE 0.9 % IV SOLN: INTRAVENOUS | Status: DC | PRN

## 2020-11-01 MED ORDER — EPINEPHRINE 0.3 MG/0.3ML IJ SOAJ: 0.3000 mg | Freq: Once | INTRAMUSCULAR | Status: DC | PRN

## 2020-11-01 NOTE — Progress Notes (Signed)
I connected by phone with [name] on 11/01/2020 at 9:37 AM to discuss the potential use of a new treatment for mild to moderate COVID-19 viral infection in non-hospitalized patients.  This patient is a 64 y.o. male that meets the FDA criteria for Emergency Use Authorization of COVID monoclonal antibody sotrovimab, casirivimab/imdevimab or bamlamivimab/estevimab.  Has a (+) direct SARS-CoV-2 viral test result  Has mild or moderate COVID-19   Is NOT hospitalized due to COVID-19  Is within 10 days of symptom onset  Has at least one of the high risk factor(s) for progression to severe COVID-19 and/or hospitalization as defined in EUA.  Specific high risk criteria : Older age (>/= 65 yo) and BMI > 25   I have spoken and communicated the following to the patient or parent/caregiver regarding COVID monoclonal antibody treatment:  1. FDA has authorized the emergency use for the treatment of mild to moderate COVID-19 in adults and pediatric patients with positive results of direct SARS-CoV-2 viral testing who are 73 years of age and older weighing at least 40 kg, and who are at high risk for progressing to severe COVID-19 and/or hospitalization.  2. The significant known and potential risks and benefits of COVID monoclonal antibody, and the extent to which such potential risks and benefits are unknown.  3. Information on available alternative treatments and the risks and benefits of those alternatives, including clinical trials.  4. Patients treated with COVID monoclonal antibody should continue to self-isolate and use infection control measures (e.g., wear mask, isolate, social distance, avoid sharing personal items, clean and disinfect "high touch" surfaces, and frequent handwashing) according to CDC guidelines.   5. The patient or parent/caregiver has the option to accept or refuse COVID monoclonal antibody treatment.  After reviewing this information with the patient, the patient has  agreed to receive one of the available covid 19 monoclonal antibodies and will be provided an appropriate fact sheet prior to infusion.  Sx onset 12/12. Set up for infusion on 12/15 @ 1:30pm. Directions given to [facility]. Pt is aware that insurance will be charged an infusion fee. Pt is fully vaccinated. + home test.   [name] 11/01/2020 9:37 AM

## 2020-11-01 NOTE — Progress Notes (Signed)
  Diagnosis: COVID-19  Physician: Dr. Asencion Noble  Procedure: Covid Infusion Clinic Med: bamlanivimab\etesevimab infusion - Provided patient with bamlanimivab\etesevimab fact sheet for patients, parents and caregivers prior to infusion.  Complications: No immediate complications noted.  Discharge: Discharged home   Janine Ores 11/01/2020

## 2020-11-01 NOTE — Progress Notes (Signed)
Patient reviewed Fact Sheet for Patients, Parents, and Caregivers for Emergency Use Authorization (EUA) of bamlanivimab and etesevimab for the Treatment of Coronavirus. Patient also reviewed and is agreeable to the estimated cost of treatment. Patient is agreeable to proceed.   

## 2020-11-01 NOTE — Discharge Instructions (Signed)
10 Things You Can Do to Manage Your COVID-19 Symptoms at Home If you have possible or confirmed COVID-19: 1. Stay home from work and school. And stay away from other public places. If you must go out, avoid using any kind of public transportation, ridesharing, or taxis. 2. Monitor your symptoms carefully. If your symptoms get worse, call your healthcare provider immediately. 3. Get rest and stay hydrated. 4. If you have a medical appointment, call the healthcare provider ahead of time and tell them that you have or may have COVID-19. 5. For medical emergencies, call 911 and notify the dispatch personnel that you have or may have COVID-19. 6. Cover your cough and sneezes with a tissue or use the inside of your elbow. 7. Wash your hands often with soap and water for at least 20 seconds or clean your hands with an alcohol-based hand sanitizer that contains at least 60% alcohol. 8. As much as possible, stay in a specific room and away from other people in your home. Also, you should use a separate bathroom, if available. If you need to be around other people in or outside of the home, wear a mask. 9. Avoid sharing personal items with other people in your household, like dishes, towels, and bedding. 10. Clean all surfaces that are touched often, like counters, tabletops, and doorknobs. Use household cleaning sprays or wipes according to the label instructions. cdc.gov/coronavirus 05/19/2019 This information is not intended to replace advice given to you by your health care provider. Make sure you discuss any questions you have with your health care provider. Document Revised: 10/21/2019 Document Reviewed: 10/21/2019 Elsevier Patient Education  2020 Elsevier Inc. What types of side effects do monoclonal antibody drugs cause?  Common side effects  In general, the more common side effects caused by monoclonal antibody drugs include: . Allergic reactions, such as hives or itching . Flu-like signs and  symptoms, including chills, fatigue, fever, and muscle aches and pains . Nausea, vomiting . Diarrhea . Skin rashes . Low blood pressure   The CDC is recommending patients who receive monoclonal antibody treatments wait at least 90 days before being vaccinated.  Currently, there are no data on the safety and efficacy of mRNA COVID-19 vaccines in persons who received monoclonal antibodies or convalescent plasma as part of COVID-19 treatment. Based on the estimated half-life of such therapies as well as evidence suggesting that reinfection is uncommon in the 90 days after initial infection, vaccination should be deferred for at least 90 days, as a precautionary measure until additional information becomes available, to avoid interference of the antibody treatment with vaccine-induced immune responses. If you have any questions or concerns after the infusion please call the Advanced Practice Provider on call at [phone]. This number is ONLY intended for your use regarding questions or concerns about the infusion post-treatment side-effects.  Please do not provide this number to others for use. For return to work notes please contact your primary care provider.   If someone you know is interested in receiving treatment please have them call the COVID hotline at [phone].   

## 2020-11-21 ENCOUNTER — Other Ambulatory Visit: Payer: Self-pay

## 2020-11-21 ENCOUNTER — Telehealth (INDEPENDENT_AMBULATORY_CARE_PROVIDER_SITE_OTHER): Payer: Medicare Other | Admitting: Internal Medicine

## 2020-11-21 ENCOUNTER — Encounter: Payer: Self-pay | Admitting: Internal Medicine

## 2020-11-21 VITALS — Ht 72.0 in | Wt 185.0 lb

## 2020-11-21 DIAGNOSIS — J019 Acute sinusitis, unspecified: Secondary | ICD-10-CM | POA: Diagnosis not present

## 2020-11-21 MED ORDER — PREDNISONE 10 MG PO TABS: ORAL_TABLET | ORAL | 0 refills | Status: DC

## 2020-11-21 MED ORDER — AMOXICILLIN 875 MG PO TABS: 875.0000 mg | ORAL_TABLET | Freq: Two times a day (BID) | ORAL | 0 refills | Status: DC

## 2020-11-21 NOTE — Progress Notes (Signed)
Pre visit review using our clinic review tool, if applicable. No additional management support is needed unless otherwise documented below in the visit note. 

## 2020-11-21 NOTE — Assessment & Plan Note (Signed)
Sinusitis: Patient is recuperating from COVID-19, has persistent sinus congestion, postnasal dripping which creates cough. On clinical grounds dx is mild sinusitis. Plan: Amoxicillin, low-dose of prednisone, continue nasal spray (Astelin) and OTC Robitussin or Mucinex. Anticipate gradual improvement. Patient verbalized understanding.

## 2020-11-21 NOTE — Progress Notes (Signed)
Subjective:    Patient ID: [name], male    DOB: Aug 29, 1956, 65 y.o.   MRN: [id number]  DOS:  11/21/2020 Type of visit - description: Virtual Visit via Video Note  I connected with the above patient  by a video enabled telemedicine application and verified that I am speaking with the correct person using two identifiers.   THIS ENCOUNTER IS A VIRTUAL VISIT DUE TO COVID-19 - PATIENT WAS NOT SEEN IN THE OFFICE. PATIENT HAS CONSENTED TO VIRTUAL VISIT / TELEMEDICINE VISIT   Location of patient: home  Location of provider: office  Persons participating in the virtual visit: patient, provider   I discussed the limitations of evaluation and management by telemedicine and the availability of in person appointments. The patient expressed understanding and agreed to proceed.  Acute The patient is recuperating from COVID-19, is about 3 weeks now. He is doing well except for persistent sinus congestion, postnasal dripping. That creates also on and off cough. He continue with nasal sprays and Mucinex but symptoms persist.  Denies fever chills No chest pain No chest congestion per se     Review of Systems See above   Past Medical History:  Diagnosis Date  . ADD (attention deficit disorder)    adult- per psych  . Adenomatous colon polyp   . Allergy   . Cancer ([facility])    basal cell skin cancer   . Diverticulitis    Perforated sigmoid diverticulitis   . Fall 4-10   fx left hip and wrist  . Heart murmur    Echo 2003 "thickened and somehow myomateous MV, mild MR"  . HOH (hard of hearing)   . Hydrocele 2016   Large, symptomatic left hydrocele, planned hydrocelectomy in 10/2015 with Dr. Retta Diones  . Insomnia    per psych  . Wears glasses     Past Surgical History:  Procedure Laterality Date  . HIP SURGERY Left 2011   ORIF femur  . HYDROCELE EXCISION Left 11/02/2015   Procedure: HYDROCELECTOMY ADULT;  Surgeon: Marcine Matar, MD;  Location: [facility];   Service: Urology;  Laterality: Left;  . LAPAROSCOPIC SIGMOID COLECTOMY N/A 02/11/2013   Procedure: LAPAROSCOPIC SIGMOID COLECTOMY WITH MOBILIZATION SPLENIC FLEXURE;  Surgeon: Lodema Pilot, DO;  Location: [facility];  Service: General;  Laterality: N/A;  . nose biopsy  2011   Dr Terri Piedra, no cancer  . POLYPECTOMY    . WRIST SURGERY  2011   left    Allergies as of 11/21/2020   No Known Allergies     Medication List       Accurate as of November 21, 2020 10:50 AM. If you have any questions, ask your nurse or doctor.        amphetamine-dextroamphetamine 10 MG tablet Commonly known as: Adderall Take 1 tablet (10 mg total) by mouth daily as needed.   amphetamine-dextroamphetamine 30 MG 24 hr capsule Commonly known as: ADDERALL XR Take 1 capsule (30 mg total) by mouth every morning.   azelastine 0.1 % nasal spray Commonly known as: ASTELIN Place 2 sprays into both nostrils at bedtime as needed for rhinitis or allergies.   Centrum Ultra Mens Tabs Take by mouth. Three times a week--Monday, Wednesday, Friday   clindamycin 1 % gel Commonly known as: CLINDAGEL Apply topically 2 (two) times daily as needed.   diclofenac sodium 1 % Gel Commonly known as: VOLTAREN Apply 4 g topically 4 (four) times daily as needed.   sildenafil 20 MG tablet Commonly  known as: REVATIO TAKE 3 TO 4 TABLETS BY MOUTH AT BEDTIME AS NEEDED   traZODone 100 MG tablet Commonly known as: DESYREL Take 0.5-1 tablets (50-100 mg total) by mouth at bedtime.          Objective:   Physical Exam Ht 6' (1.829 m)   Wt 185 lb (83.9 kg)   BMI 25.09 kg/m  This is a virtual video visit, alert oriented x3, voice is a little hoarse and nasal.  No distress.    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 On clinical grounds dx is mild sinusitis. Plan: Amoxicillin, low-dose of prednisone, continue nasal spray (Astelin) and OTC Robitussin or Mucinex. Anticipate gradual improvement. Patient verbalized understanding.    I discussed the assessment and treatment plan with the patient. The patient was provided an opportunity to ask questions and all were answered. The patient agreed with the plan and demonstrated an understanding of the instructions.   The patient was advised to call back or seek an in-person evaluation if the symptoms worsen or if the condition fails to improve as anticipated.

## 2020-11-27 ENCOUNTER — Telehealth: Payer: Self-pay | Admitting: Internal Medicine

## 2020-11-27 MED ORDER — AMPHETAMINE-DEXTROAMPHET ER 30 MG PO CP24: 30.0000 mg | ORAL_CAPSULE | ORAL | 0 refills | Status: DC

## 2020-11-27 MED ORDER — AMPHETAMINE-DEXTROAMPHETAMINE 10 MG PO TABS: 10.0000 mg | ORAL_TABLET | Freq: Every day | ORAL | 0 refills | Status: DC | PRN

## 2020-11-27 MED ORDER — SILDENAFIL CITRATE 20 MG PO TABS: ORAL_TABLET | ORAL | 3 refills | Status: DC

## 2020-11-27 NOTE — Telephone Encounter (Signed)
PDMP okay, prescription for this and next month sent.

## 2020-11-27 NOTE — Telephone Encounter (Signed)
Medication: amphetamine-dextroamphetamine (ADDERALL XR) 30 MG 24 hr capsule [[id number]]   sildenafil (REVATIO) 20 MG tablet [[id number]]    Has the patient contacted their pharmacy? no (If no, request that the patient contact the pharmacy for the refill.) (If yes, when and what did the pharmacy advise?)    Preferred Pharmacy (with phone number or street name):   [facility] [place], [address]  [place], [place] [address]  Phone:  [phone] Fax:  [phone]     Agent: Please be advised that RX refills may take up to 3 business days. We ask that you follow-up with your pharmacy.

## 2020-11-27 NOTE — Telephone Encounter (Signed)
Requesting: Adderall XR 30mg  and Adderall 10mg  Contract: 10/12/2019 UDS: 09/19/2020 Last Visit: 11/21/2020 Next Visit: None Last Refill: 10/18/2020 #30 and 0RF (for both)  Please Advise

## 2020-12-18 ENCOUNTER — Other Ambulatory Visit: Payer: Self-pay | Admitting: Internal Medicine

## 2021-01-09 ENCOUNTER — Telehealth: Payer: Self-pay | Admitting: Internal Medicine

## 2021-01-09 MED ORDER — AMPHETAMINE-DEXTROAMPHETAMINE 10 MG PO TABS: 10.0000 mg | ORAL_TABLET | Freq: Every day | ORAL | 0 refills | Status: DC | PRN

## 2021-01-09 MED ORDER — AMPHETAMINE-DEXTROAMPHET ER 30 MG PO CP24: 30.0000 mg | ORAL_CAPSULE | ORAL | 0 refills | Status: DC

## 2021-01-09 NOTE — Telephone Encounter (Signed)
PDMP okay, Rx sent x2 

## 2021-01-09 NOTE — Telephone Encounter (Signed)
Medication: amphetamine-dextroamphetamine (ADDERALL XR) 30 MG 24 hr capsule    Has the patient contacted their pharmacy? No. (If no, request that the patient contact the pharmacy for the refill.) (If yes, when and what did the pharmacy advise?)  Preferred Pharmacy (with phone number or street name): [facility] [place], [address]  [place], [place] [address]  Phone:  [phone] Fax:  [phone]  DEA #:  [id number]  Agent: Please be advised that RX refills may take up to 3 business days. We ask that you follow-up with your pharmacy.

## 2021-01-09 NOTE — Telephone Encounter (Signed)
Requesting: Adderall XR 30mg , Adderall 10mg  Contract: 10/12/2019 UDS: 09/19/2020 Last Visit: 11/21/2020 (acute visit)  Next Visit: None Last Refill for both: 11/27/2020 #30 and 0RF  Please Advise

## 2021-01-30 ENCOUNTER — Encounter: Payer: Self-pay | Admitting: Internal Medicine

## 2021-02-06 ENCOUNTER — Other Ambulatory Visit: Payer: Self-pay | Admitting: Internal Medicine

## 2021-02-09 ENCOUNTER — Telehealth: Payer: Self-pay | Admitting: Internal Medicine

## 2021-02-09 MED ORDER — AMPHETAMINE-DEXTROAMPHETAMINE 10 MG PO TABS: 10.0000 mg | ORAL_TABLET | Freq: Every day | ORAL | 0 refills | Status: DC | PRN

## 2021-02-09 MED ORDER — AMPHETAMINE-DEXTROAMPHET ER 30 MG PO CP24: 30.0000 mg | ORAL_CAPSULE | ORAL | 0 refills | Status: DC

## 2021-02-09 NOTE — Telephone Encounter (Signed)
Requesting: Adderall XR 30mg  and Adderall 10mg  Contract: 10/12/2019 UDS: 09/19/2020 Last Visit: 11/21/2020 Next Visit: None Last Refill on Adderall XR 30mg : 01/09/2021 Last Refill on Adderall 10mg : 01/09/2021  Please Advise

## 2021-02-09 NOTE — Telephone Encounter (Signed)
PDMP okay, prescription sent. Next visit needed is May 2022

## 2021-02-09 NOTE — Telephone Encounter (Signed)
Medication: amphetamine-dextroamphetamine (ADDERALL XR) 30 MG 24 hr capsule [[id number]]       Has the patient contacted their pharmacy?  (If no, request that the patient contact the pharmacy for the refill.) (If yes, when and what did the pharmacy advise?)     Preferred Pharmacy (with phone number or street name): [facility] [place], [address]  [place], [place] [address]  Phone:  [phone] Fax:  [phone]      Agent: Please be advised that RX refills may take up to 3 business days. We ask that you follow-up with your pharmacy.

## 2021-03-14 ENCOUNTER — Telehealth: Payer: Self-pay | Admitting: Internal Medicine

## 2021-03-14 MED ORDER — AMPHETAMINE-DEXTROAMPHET ER 30 MG PO CP24: 30.0000 mg | ORAL_CAPSULE | ORAL | 0 refills | Status: DC

## 2021-03-14 MED ORDER — AMPHETAMINE-DEXTROAMPHETAMINE 10 MG PO TABS: 10.0000 mg | ORAL_TABLET | Freq: Every day | ORAL | 0 refills | Status: DC | PRN

## 2021-03-14 NOTE — Telephone Encounter (Signed)
Requesting: Adderall Xr 30mg  and Adderall 10mg  Contract: 10/12/2019 UDS: 09/19/2020 Last Visit: 11/21/2020 Next Visit: None Last Refill: 02/09/2021 #30 and 0RF  Please Advise

## 2021-03-14 NOTE — Telephone Encounter (Signed)
Medication: amphetamine-dextroamphetamine (ADDERALL XR) 30 MG 24 hr capsule ° ° ° °Has the patient contacted their pharmacy? No. °(If no, request that the patient contact the pharmacy for the refill.) °(If yes, when and what did the pharmacy advise?) ° °Preferred Pharmacy (with phone number or street name): [id number] - [place], [place] - [address]  °[address], [place] [address]  °Phone:  [phone]  Fax:  [phone]  °DEA #:  [id number] ° °Agent: Please be advised that RX refills may take up to 3 business days. We ask that you follow-up with your pharmacy. ° °

## 2021-03-14 NOTE — Telephone Encounter (Signed)
Please arrange a CPX in about 2 to 3 months  PDMP okay, 2 Rx sent

## 2021-03-21 NOTE — Telephone Encounter (Signed)
appt scheduled with pt

## 2021-03-30 ENCOUNTER — Ambulatory Visit: Payer: Medicare Other | Attending: Internal Medicine

## 2021-03-30 DIAGNOSIS — Z23 Encounter for immunization: Secondary | ICD-10-CM

## 2021-03-30 NOTE — Progress Notes (Signed)
   Covid-19 Vaccination Clinic  Name:  LINZIE BOURSIQUOT    MRN: [id number] DOB: 09/04/1956  03/30/2021  Mr. Hamre was observed post Covid-19 immunization for 15 minutes without incident. He was provided with Vaccine Information Sheet and instruction to access the V-Safe system.   Mr. Lamp was instructed to call 911 with any severe reactions post vaccine: [name] Difficulty breathing  . Swelling of face and throat  . A fast heartbeat  . A bad rash all over body  . Dizziness and weakness   Immunizations Administered    Name Date Dose VIS Date Route   PFIZER Comrnaty(Gray TOP) Covid-19 Vaccine 03/30/2021  9:35 AM 0.3 mL 10/26/2020 Intramuscular   Manufacturer: [employer], [employer]   Lot: [id number]   NDC: [phone]

## 2021-04-02 ENCOUNTER — Other Ambulatory Visit (HOSPITAL_BASED_OUTPATIENT_CLINIC_OR_DEPARTMENT_OTHER): Payer: Self-pay

## 2021-04-02 MED ORDER — PFIZER-BIONT COVID-19 VAC-TRIS 30 MCG/0.3ML IM SUSP
INTRAMUSCULAR | 0 refills | Status: DC
  Filled 2021-04-02: qty 0.3, 1d supply, fill #0

## 2021-04-03 ENCOUNTER — Other Ambulatory Visit (HOSPITAL_BASED_OUTPATIENT_CLINIC_OR_DEPARTMENT_OTHER): Payer: Self-pay

## 2021-04-18 ENCOUNTER — Telehealth: Payer: Self-pay | Admitting: Internal Medicine

## 2021-04-18 MED ORDER — AMPHETAMINE-DEXTROAMPHETAMINE 10 MG PO TABS: 10.0000 mg | ORAL_TABLET | Freq: Every day | ORAL | 0 refills | Status: DC | PRN

## 2021-04-18 MED ORDER — AMPHETAMINE-DEXTROAMPHET ER 30 MG PO CP24: 30.0000 mg | ORAL_CAPSULE | ORAL | 0 refills | Status: DC

## 2021-04-18 NOTE — Telephone Encounter (Signed)
Requesting: Adderall XR 30mg  and Adderall 10mg  Contract: 10/12/2019 UDS:09/19/2020 Last Visit: 11/21/2020 Next Visit: 06/27/2021 Last Refill: 03/14/2021 #30 and 0RF  Please Advise

## 2021-04-18 NOTE — Telephone Encounter (Signed)
PDMP okay, Rx sent. Has a follow-up scheduled on 06/27/2021

## 2021-04-18 NOTE — Telephone Encounter (Signed)
Medication: amphetamine-dextroamphetamine (ADDERALL XR) 30 MG 24 hr capsule [[id number]]      Has the patient contacted their pharmacy?  (If no, request that the patient contact the pharmacy for the refill.) (If yes, when and what did the pharmacy advise?)     Preferred Pharmacy (with phone number or street name):[facility] [place], [place] - [address]  [place], [place] [address]  Phone:  [phone] Fax:  [phone]      Agent: Please be advised that RX refills may take up to 3 business days. We ask that you follow-up with your pharmacy.

## 2021-04-25 ENCOUNTER — Other Ambulatory Visit: Payer: Self-pay

## 2021-04-25 ENCOUNTER — Ambulatory Visit (INDEPENDENT_AMBULATORY_CARE_PROVIDER_SITE_OTHER): Payer: Medicare Other | Admitting: Medical

## 2021-04-25 VITALS — BP 137/80 | HR 88 | Temp 98.5°F | Ht 73.0 in | Wt 177.0 lb

## 2021-04-25 DIAGNOSIS — S70361A Insect bite (nonvenomous), right thigh, initial encounter: Secondary | ICD-10-CM

## 2021-04-25 DIAGNOSIS — W57XXXA Bitten or stung by nonvenomous insect and other nonvenomous arthropods, initial encounter: Secondary | ICD-10-CM

## 2021-04-25 MED ORDER — DOXYCYCLINE HYCLATE 100 MG PO TABS: 100.0000 mg | ORAL_TABLET | Freq: Two times a day (BID) | ORAL | 0 refills | Status: DC

## 2021-04-25 NOTE — Progress Notes (Signed)
Subjective:    Patient ID: [name], male    DOB: 12-Aug-1956, 65 y.o.   MRN: [id number]  HPI  Pt has tick which he saw on Monday. He scratched it off on Monday. The area was itching. He played golf on Monday.  No fever, no chills, no body aches or fatigue.  Pt has put alcohol and neosporin.    Review of Systems  Constitutional: Negative for chills, fatigue and fever.  Respiratory: Negative for cough, chest tightness, shortness of breath and wheezing.   Cardiovascular: Negative for chest pain and palpitations.  Gastrointestinal: Negative for abdominal pain.  Genitourinary: Negative for flank pain and penile discharge.  Musculoskeletal: Negative for back pain.  Skin: Positive for rash.  Neurological: Negative for dizziness and headaches.  Hematological: Negative for adenopathy. Does not bruise/bleed easily.  Psychiatric/Behavioral: Negative for behavioral problems, confusion, dysphoric mood and suicidal ideas. The patient is not nervous/anxious.     Past Medical History:  Diagnosis Date  . ADD (attention deficit disorder)    adult- per psych  . Adenomatous colon polyp   . Allergy   . Cancer ([place])    basal cell skin cancer   . Diverticulitis    Perforated sigmoid diverticulitis   . Fall 4-10   fx left hip and wrist  . Heart murmur    Echo 2003 "thickened and somehow myomateous MV, mild MR"  . HOH (hard of hearing)   . Hydrocele 2016   Large, symptomatic left hydrocele, planned hydrocelectomy in 10/2015 with Dr. Diona Fanti  . Insomnia    per psych  . Wears glasses      Social History   Socioeconomic History  . Marital status: Married    Spouse name: [name]  . Number of children: 3  . Years of education: Not on file  . Highest education level: Not on file  Occupational History  . Occupation: Science writer, [employer]   . Occupation: Retired, 06-2020  Tobacco Use  . Smoking status: Never Smoker  . Smokeless tobacco: Never Used  Substance and Sexual Activity  .  Alcohol use: Yes    Alcohol/week: 6.0 standard drinks    Types: 6 Glasses of wine per week    Comment: MODERATION  . Drug use: No  . Sexual activity: Not on file  Other Topics Concern  . Not on file  Social History Narrative   3 adult children, 3 g-children   Household-- pt and wife      Social Determinants of Radio broadcast assistant Strain: Not on file  Food Insecurity: Not on file  Transportation Needs: Not on file  Physical Activity: Not on file  Stress: Not on file  Social Connections: Not on file  Intimate Partner Violence: Not on file    Past Surgical History:  Procedure Laterality Date  . HIP SURGERY Left 2011   ORIF femur  . HYDROCELE EXCISION Left 11/02/2015   Procedure: HYDROCELECTOMY ADULT;  Surgeon: Franchot Gallo, MD;  [facility];  Service: Urology;  Laterality: Left;  . LAPAROSCOPIC SIGMOID COLECTOMY N/A 02/11/2013   Procedure: LAPAROSCOPIC SIGMOID COLECTOMY WITH MOBILIZATION SPLENIC FLEXURE;  Surgeon: Madilyn Hook, DO;  Location: [facility];  Service: General;  Laterality: N/A;  . nose biopsy  2011   Dr Allyson Sabal, no cancer  . POLYPECTOMY    . WRIST SURGERY  2011   left    Family History  Problem Relation Age of Onset  . Breast cancer Mother   . Heart failure  Mother   . Lung cancer Father        smoker  . Diabetes Neg Hx   . Hypertension Neg Hx   . Colon cancer Neg Hx   . Prostate cancer Neg Hx   . Heart attack Neg Hx   . Colon polyps Neg Hx   . Esophageal cancer Neg Hx   . Rectal cancer Neg Hx   . Stomach cancer Neg Hx     No Known Allergies  Current Outpatient Medications on File Prior to Visit  Medication Sig Dispense Refill  . amphetamine-dextroamphetamine (ADDERALL XR) 30 MG 24 hr capsule Take 1 capsule (30 mg total) by mouth every morning. 30 capsule 0  . amphetamine-dextroamphetamine (ADDERALL) 10 MG tablet Take 1 tablet (10 mg total) by mouth daily as needed. 30 tablet 0  . azelastine (ASTELIN) 0.1 % nasal  spray Place 2 sprays into both nostrils at bedtime as needed for rhinitis or allergies. 30 mL 12  . COVID-19 mRNA Vac-TriS, Pfizer, (PFIZER-BIONT COVID-19 VAC-TRIS) SUSP injection Inject into the muscle. 0.3 mL 0  . diclofenac sodium (VOLTAREN) 1 % GEL Apply 4 g topically 4 (four) times daily as needed. 100 g 0  . Multiple Vitamins-Minerals (CENTRUM ULTRA MENS) TABS Take by mouth. Three times a week--Monday, Wednesday, Friday    . sildenafil (REVATIO) 20 MG tablet TAKE 3 TO 4 TABLETS BY MOUTH AT BEDTIME AS NEEDED 30 tablet 3  . traZODone (DESYREL) 100 MG tablet Take 0.5-1 tablets (50-100 mg total) by mouth at bedtime as needed for sleep. 30 tablet 3   No current facility-administered medications on file prior to visit.    BP 137/80   Pulse 88   Temp 98.5 F (36.9 C)   Ht 6\' 1"  (1.854 m)   Wt 177 lb (80.3 kg)   SpO2 96%   BMI 23.35 kg/m       Objective:   Physical Exam  General- No acute distress. Pleasant patient. Neck- Full range of motion, no jvd Lungs- Clear, even and unlabored. Heart- regular rate and rhythm. Neurologic- CNII- XII grossly intact.  Rt thigh- medial aspect 1 cm approx mild red pink area. Mild indurated. On close inspection no portion of tick seen.      Assessment & Plan:  For recent tick bite with some indication of early skin infection prescribed doxycycline antibiotic. Tick bite so recent that I think antibody studies would lead to false negative. Placed future order to get labs done in 2 weeks. If antibody studies were + then would extend course of antibiotic.  Follow up as regular scheduled with pcp or as needed

## 2021-04-25 NOTE — Patient Instructions (Signed)
For recent tick bite with some indication of early skin infection prescribed doxycycline antibiotic. Tick bite so recent that I think antibody studies would lead to false negative. Placed future order to get labs done in 2 weeks. If antibody studies were + then would extend course of antibiotic.  Follow up as regular scheduled with pcp or as needed

## 2021-05-01 ENCOUNTER — Other Ambulatory Visit: Payer: Self-pay | Admitting: Internal Medicine

## 2021-05-09 ENCOUNTER — Other Ambulatory Visit: Payer: Medicare Other

## 2021-05-16 ENCOUNTER — Telehealth: Payer: Self-pay

## 2021-05-16 DIAGNOSIS — Z09 Encounter for follow-up examination after completed treatment for conditions other than malignant neoplasm: Secondary | ICD-10-CM

## 2021-05-16 NOTE — Telephone Encounter (Signed)
Please advise 

## 2021-05-16 NOTE — Telephone Encounter (Signed)
Pt called asking for refill on clindamycin 1% gel.  Last refill was in 09/2019.  Pt stated he uses this for an ongoing issue of skin irritation from shaving and ingrown hair on his face.  [facility] [address] in [place].

## 2021-05-17 MED ORDER — CLINDAMYCIN PHOSPHATE 1 % EX GEL: Freq: Two times a day (BID) | CUTANEOUS | 0 refills | Status: DC

## 2021-05-17 NOTE — Telephone Encounter (Signed)
Rx sent 

## 2021-05-17 NOTE — Telephone Encounter (Signed)
Okay to send Clindagel, 1 tube and 3 refills

## 2021-05-24 ENCOUNTER — Encounter: Payer: Self-pay | Admitting: Internal Medicine

## 2021-05-31 ENCOUNTER — Telehealth: Payer: Self-pay

## 2021-05-31 MED ORDER — SILDENAFIL CITRATE 20 MG PO TABS: ORAL_TABLET | ORAL | 3 refills | Status: DC

## 2021-05-31 NOTE — Telephone Encounter (Signed)
Pt stated he has reached out to pharmacy for refill.  Needing refill on sildenafil.  Please send to [facility] in [place] on [address].

## 2021-05-31 NOTE — Telephone Encounter (Signed)
Rx sent 

## 2021-06-19 ENCOUNTER — Encounter: Payer: Self-pay | Admitting: Internal Medicine

## 2021-06-20 ENCOUNTER — Telehealth: Payer: Self-pay | Admitting: *Deleted

## 2021-06-20 NOTE — Telephone Encounter (Signed)
Message left for patient asking  if he can change his pre visit  virtual appointment from 8/26 to 8/22 @ 4 pm . If patient agrees, Joella Prince will put patient on the schedule.

## 2021-06-21 NOTE — Telephone Encounter (Signed)
Inbound call from patient. Agreed to change PV to 8/22 @ 4pm

## 2021-06-27 ENCOUNTER — Encounter: Payer: Self-pay | Admitting: Internal Medicine

## 2021-06-27 ENCOUNTER — Other Ambulatory Visit: Payer: Self-pay

## 2021-06-27 ENCOUNTER — Ambulatory Visit (INDEPENDENT_AMBULATORY_CARE_PROVIDER_SITE_OTHER): Payer: Medicare Other | Admitting: Internal Medicine

## 2021-06-27 VITALS — BP 130/80 | HR 103 | Temp 98.0°F | Resp 16 | Ht 73.0 in | Wt 180.2 lb

## 2021-06-27 DIAGNOSIS — E785 Hyperlipidemia, unspecified: Secondary | ICD-10-CM | POA: Diagnosis not present

## 2021-06-27 DIAGNOSIS — R739 Hyperglycemia, unspecified: Secondary | ICD-10-CM

## 2021-06-27 DIAGNOSIS — M25562 Pain in left knee: Secondary | ICD-10-CM

## 2021-06-27 DIAGNOSIS — E038 Other specified hypothyroidism: Secondary | ICD-10-CM | POA: Diagnosis not present

## 2021-06-27 DIAGNOSIS — F988 Other specified behavioral and emotional disorders with onset usually occurring in childhood and adolescence: Secondary | ICD-10-CM

## 2021-06-27 DIAGNOSIS — Z79899 Other long term (current) drug therapy: Secondary | ICD-10-CM | POA: Diagnosis not present

## 2021-06-27 DIAGNOSIS — Z125 Encounter for screening for malignant neoplasm of prostate: Secondary | ICD-10-CM

## 2021-06-27 LAB — HEMOGLOBIN A1C: Hgb A1c MFr Bld: 5.6 % (ref 4.6–6.5)

## 2021-06-27 LAB — CBC WITH DIFFERENTIAL/PLATELET
Basophils Absolute: 0.1 10*3/uL (ref 0.0–0.1)
Basophils Relative: 1.7 % (ref 0.0–3.0)
Eosinophils Absolute: 0.1 10*3/uL (ref 0.0–0.7)
Eosinophils Relative: 1.9 % (ref 0.0–5.0)
HCT: 47.5 % (ref 39.0–52.0)
Hemoglobin: 15.7 g/dL (ref 13.0–17.0)
Lymphocytes Relative: 35 % (ref 12.0–46.0)
Lymphs Abs: 1.8 10*3/uL (ref 0.7–4.0)
MCHC: 33.1 g/dL (ref 30.0–36.0)
MCV: 96.4 fl (ref 78.0–100.0)
Monocytes Absolute: 0.6 10*3/uL (ref 0.1–1.0)
Monocytes Relative: 11.4 % (ref 3.0–12.0)
Neutro Abs: 2.6 10*3/uL (ref 1.4–7.7)
Neutrophils Relative %: 50 % (ref 43.0–77.0)
Platelets: 230 10*3/uL (ref 150.0–400.0)
RBC: 4.93 Mil/uL (ref 4.22–5.81)
RDW: 14.1 % (ref 11.5–15.5)
WBC: 5.2 10*3/uL (ref 4.0–10.5)

## 2021-06-27 LAB — COMPREHENSIVE METABOLIC PANEL
ALT: 23 U/L (ref 0–53)
AST: 24 U/L (ref 0–37)
Albumin: 4.5 g/dL (ref 3.5–5.2)
Alkaline Phosphatase: 54 U/L (ref 39–117)
BUN: 30 mg/dL — ABNORMAL HIGH (ref 6–23)
CO2: 27 mEq/L (ref 19–32)
Calcium: 9.6 mg/dL (ref 8.4–10.5)
Chloride: 101 mEq/L (ref 96–112)
Creatinine, Ser: 1.13 mg/dL (ref 0.40–1.50)
GFR: 67.98 mL/min (ref >60.00)
Glucose, Bld: 105 mg/dL — ABNORMAL HIGH (ref 70–99)
Potassium: 4.6 mEq/L (ref 3.5–5.1)
Sodium: 137 mEq/L (ref 135–145)
Total Bilirubin: 0.9 mg/dL (ref 0.2–1.2)
Total Protein: 7.5 g/dL (ref 6.0–8.3)

## 2021-06-27 LAB — LIPID PANEL
Cholesterol: 273 mg/dL — ABNORMAL HIGH (ref 0–200)
HDL: 99 mg/dL (ref >39.00)
LDL Cholesterol: 156 mg/dL — ABNORMAL HIGH (ref 0–99)
NonHDL: 174.04
Total CHOL/HDL Ratio: 3
Triglycerides: 91 mg/dL (ref 0.0–149.0)
VLDL: 18.2 mg/dL (ref 0.0–40.0)

## 2021-06-27 LAB — PSA: PSA: 2.03 ng/mL (ref 0.10–4.00)

## 2021-06-27 LAB — TSH: TSH: 4.86 u[IU]/mL (ref 0.35–5.50)

## 2021-06-27 LAB — T4, FREE: Free T4: 0.75 ng/dL (ref 0.60–1.60)

## 2021-06-27 MED ORDER — AZELASTINE HCL 0.1 % NA SOLN: 2.0000 | Freq: Two times a day (BID) | NASAL | 12 refills | Status: DC

## 2021-06-27 NOTE — Progress Notes (Signed)
Subjective:    Patient ID: [name], male    DOB: [birth year], 65 y.o.   MRN: [id number]  DOS:  06/27/2021 Type of visit - description: Routine office visit Today with talk about ADD, allergies, HOH, vaccinations. Since he retired he is more active, playing golf very frequently, reports left knee pain constantly, worse with certain movements. Is taking less ADD medication than before. Allergies not completely well controlled with Astelin daily  Review of Systems Denies chest pain or difficulty breathing No nausea, vomiting, diarrhea.  No blood in the stools. No urinary symptoms  Past Medical History:  Diagnosis Date   ADD (attention deficit disorder)    adult- per psych   Adenomatous colon polyp    Allergy    Cancer ([place])    basal cell skin cancer    Diverticulitis    Perforated sigmoid diverticulitis    Fall 4-10   fx left hip and wrist   Heart murmur    Echo 2003 "thickened and somehow myomateous MV, mild MR"   HOH (hard of hearing)    Hydrocele 2016   Large, symptomatic left hydrocele, planned hydrocelectomy in 10/2015 with Dr. Diona Fanti   Insomnia    per psych   Wears glasses     Past Surgical History:  Procedure Laterality Date   HIP SURGERY Left 2011   ORIF femur   HYDROCELE EXCISION Left 11/02/2015   Procedure: HYDROCELECTOMY ADULT;  Surgeon: Franchot Gallo, MD;  Location: [facility];  Service: Urology;  Laterality: Left;   LAPAROSCOPIC SIGMOID COLECTOMY N/A 02/11/2013   Procedure: LAPAROSCOPIC SIGMOID COLECTOMY WITH MOBILIZATION SPLENIC FLEXURE;  Surgeon: Madilyn Hook, DO;  Location: [facility];  Service: General;  Laterality: N/A;   nose biopsy  2011   Dr Allyson Sabal, no cancer   POLYPECTOMY     WRIST SURGERY  2011   left    Allergies as of 06/27/2021   No Known Allergies      Medication List        Accurate as of June 27, 2021  9:47 AM. If you have any questions, ask your nurse or doctor.          STOP taking these medications     Pfizer-BioNT COVID-19 Vac-TriS Susp injection Generic drug: COVID-19 mRNA Vac-TriS Therapist, music) Stopped by: Kathlene November, MD       TAKE these medications    amphetamine-dextroamphetamine 30 MG 24 hr capsule Commonly known as: ADDERALL XR Take 1 capsule (30 mg total) by mouth every morning.   amphetamine-dextroamphetamine 10 MG tablet Commonly known as: Adderall Take 1 tablet (10 mg total) by mouth daily as needed.   azelastine 0.1 % nasal spray Commonly known as: ASTELIN Place 2 sprays into both nostrils at bedtime as needed for rhinitis or allergies.   Centrum Ultra Mens Tabs Take by mouth. Three times a week--Monday, Wednesday, Friday   clindamycin 1 % gel Commonly known as: Clindagel Apply topically 2 (two) times daily.   diclofenac sodium 1 % Gel Commonly known as: VOLTAREN Apply 4 g topically 4 (four) times daily as needed.   doxycycline 100 MG tablet Commonly known as: VIBRA-TABS Take 1 tablet (100 mg total) by mouth 2 (two) times daily.   sildenafil 20 MG tablet Commonly known as: REVATIO Take 3-4 tabs by mouth at bedtime as needed   traZODone 100 MG tablet Commonly known as: DESYREL Take 0.5-1 tablets (50-100 mg total) by mouth at bedtime as needed for sleep.  Objective:   Physical Exam BP 130/80 (BP Location: Left Arm, Patient Position: Sitting, Cuff Size: Small)   Pulse (!) 103   Temp 98 F (36.7 C) (Oral)   Resp 16   Ht '6\' 1"'$  (1.854 m)   Wt 180 lb 4 oz (81.8 kg)   SpO2 95%   BMI 23.78 kg/m  General: Well developed, NAD, BMI noted Neck: No  thyromegaly  HEENT:  Normocephalic . Face symmetric, atraumatic Lungs:  CTA B Normal respiratory effort, no intercostal retractions, no accessory muscle use. Heart: RRR,  no murmur.  Abdomen:  Not distended, soft, non-tender. No rebound or rigidity. DRE: Normal sphincter tone, + stools, exam somewhat limited but prostate not enlarged, nodular or tender Lower extremities: Knees: Symmetric,  mild pain elicited with hyper flexion of the L knee Skin: Exposed areas without rash. Not pale. Not jaundice Neurologic:  alert & oriented X3.  Speech normal, gait appropriate for age and unassisted Strength symmetric and appropriate for age.  Psych: Cognition and judgment appear intact.  Cooperative with normal attention span and concentration.  Behavior appropriate. No anxious or depressed appearing.     Assessment     Assessment ADD,  Insomnia per  [facility] >>> Rx transferred to PCP 09-2019 Snoring palpable aorta: CT 2014 no AAA H/o diverticulitis, perforated, sigmoid colectomy 2014 H/o Hydrocele 2016, large, symptomatic, saw urology, excised H/o BCC, sees derm Barbae folliculitis, previously dermatology prescribed Ziana, now on Flagyl as needed H/o  heart murmur, echo 2013 --thick MV, mild MR COVID infection 10/2020, s/p MAB  PLAN ADD: Currently taking Adderall 10 mg daily and rarely needs Adderall XR 30 mg.  UDS and contract today. Allergies: Rec to increase Astelin to B.I.D..  Did not like Flonase. Left knee pain: New issue, exam is benign, refer to sports medicine.    Hyperlipidemia: Borderline, CV RF 9.9%.  Check CMP, FLP Hyperglycemia: A1c was 5.6 at some point, check A1c Elevated TSH: TSH, T4 Preventive care reviewed RTC  1year   This visit occurred during the SARS-CoV-2 public health emergency.  Safety protocols were in place, including screening questions prior to the visit, additional usage of staff PPE, and extensive cleaning of exam room while observing appropriate contact time as indicated for disinfecting solutions.

## 2021-06-27 NOTE — Patient Instructions (Signed)
   GO TO THE LAB : Get the blood work     [place], [place] back for a checkup in 1 year     "Living will", "[place] of attorney": Advanced care planning  (If you already have a living will or healthcare power of attorney, please bring the copy to be scanned in your chart.)  Advance care planning is a process that supports adults in  understanding and sharing their preferences regarding future medical care.   The patient's preferences are recorded in documents called Advance Directives.    Advanced directives are completed (and can be modified at any time) while the patient is in full mental capacity.   The documentation should be available at all times to the patient, the family and the healthcare providers.  Bring in a copy to be scanned in your chart is an excellent idea and is recommended   This legal documents direct treatment decision making and/or appoint a surrogate to make the decision if the patient is not capable to do so.    Advance directives can be documented in many types of formats,  documents have names such as:  Lliving will  Durable power of attorney for healthcare (healthcare proxy or healthcare power of attorney)  Combined directives  Physician orders for life-sustaining treatment    More information at:  [email or web address]

## 2021-06-28 ENCOUNTER — Encounter: Payer: Self-pay | Admitting: Internal Medicine

## 2021-06-28 NOTE — Assessment & Plan Note (Signed)
-   Tdap 2020 - zostavax --12-2015; s/p shingrex   - covid vax x 4 per pt  --CCS: *cscope 05/2013--Dr Perry--multiple adenomas *cscope again 05-2016, 5 years, scheduled 07-2021 --Prostate ca screening : No symptoms, prostate exam normal, check PSA. -- POA discussed

## 2021-06-28 NOTE — Assessment & Plan Note (Signed)
ADD: Currently taking Adderall 10 mg daily and rarely needs Adderall XR 30 mg.  UDS and contract today. Allergies: Rec to increase Astelin to B.I.D..  Did not like Flonase. Left knee pain: New issue, exam is benign, refer to sports medicine.    Hyperlipidemia: Borderline, CV RF 9.9%.  Check CMP, FLP Hyperglycemia: A1c was 5.6 at some point, check A1c Elevated TSH: TSH, T4 Preventive care reviewed RTC  1year

## 2021-06-30 LAB — DRUG MONITORING, PANEL 8 WITH CONFIRMATION, URINE
6 Acetylmorphine: NEGATIVE ng/mL (ref <10)
Alcohol Metabolites: POSITIVE ng/mL — AB (ref <500)
Amphetamine: 1088 ng/mL — ABNORMAL HIGH (ref <250)
Amphetamines: POSITIVE ng/mL — AB (ref <500)
Benzodiazepines: NEGATIVE ng/mL (ref <100)
Buprenorphine, Urine: NEGATIVE ng/mL (ref <5)
Cocaine Metabolite: NEGATIVE ng/mL (ref <150)
Creatinine: 198.6 mg/dL (ref >=20.0)
Ethyl Glucuronide (ETG): 5587 ng/mL — ABNORMAL HIGH (ref <500)
Ethyl Sulfate (ETS): 1835 ng/mL — ABNORMAL HIGH (ref <100)
MDMA: NEGATIVE ng/mL (ref <500)
Marijuana Metabolite: NEGATIVE ng/mL (ref <20)
Methamphetamine: NEGATIVE ng/mL (ref <250)
Opiates: NEGATIVE ng/mL (ref <100)
Oxidant: NEGATIVE ug/mL (ref <200)
Oxycodone: NEGATIVE ng/mL (ref <100)
pH: 7 (ref 4.5–9.0)

## 2021-06-30 LAB — DM TEMPLATE

## 2021-07-03 NOTE — Progress Notes (Signed)
Subjective:    CC: L knee pain  I, [name], LAT, ATC, am serving as scribe for Dr. [name].  HPI: Pt is a 65 y/o male presenting w/ L knee pain x 6 weeks, w/ no known MOI.  He locates his pain to the anterior aspect of knee. Pt also reports he has some numbness along the lateral aspect of L thigh and slightly into lat knee. Pt is an avid golfer and has been playing a lot. Pt notes a hx of a L femur fx, 14 years ago, that required reconstructive surgery.  L knee swelling: no L knee mechanical symptoms: no Aggravating factors: knee flexion Treatments tried: ice, Voltaren gel  Due to compensation, pt is also experiencing L lateral hip pain x about 1 week.  Pertinent review of Systems: No fevers or chills  Relevant historical information: Left femur fracture requiring surgery.  This happened after he fell off a ladder.   Objective:    Vitals:   07/04/21 0927  BP: (!) 142/88  Pulse: 89  SpO2: 98%   General: Well Developed, well nourished, and in no acute distress.   MSK: Left knee normal. Normal motion. Tender palpation medial joint line. Stable ligamentous exam. Minimally positive McMurray's test. Intact strength. Pulses cap refill and sensation are intact distally.  Lab and Radiology Results  Procedure: Real-time Ultrasound Guided Injection of left knee superior lateral patellar space Device: Philips Affiniti 50G Images permanently stored and available for review in PACS Ultrasound evaluation injection reveals intact quadriceps and patellar tendon.  Medial meniscus with possible minimal tear degenerative appearing Normal lateral meniscus.  No Baker's cyst. Verbal informed consent obtained.  Discussed risks and benefits of procedure. Warned about infection bleeding damage to structures skin hypopigmentation and fat atrophy among others. Patient expresses understanding and agreement Time-out conducted.   Noted no overlying erythema, induration, or other  signs of local infection.   Skin prepped in a sterile fashion.   Local anesthesia: Topical Ethyl chloride.   With sterile technique and under real time ultrasound guidance: 40 mg of Kenalog and 2 mL of Marcaine injected into knee joint. Fluid seen entering the joint capsule.   Completed without difficulty   Pain immediately resolved suggesting accurate placement of the medication.   Advised to call if fevers/chills, erythema, induration, drainage, or persistent bleeding.   Images permanently stored and available for review in the ultrasound unit.  Impression: Technically successful ultrasound guided injection.    X-ray images left knee obtained today personally and independently interpreted No visible surgical hardware in the femur.  No severe DJD.  No acute fractures.  Mild medial compartment and patellofemoral DJD. Await formal radiology review    Impression and Recommendations:    Assessment and Plan: 65 y.o. male with left knee pain thought to be related to exacerbation of DJD and possible degenerative medial meniscus tear.  Plan for steroid injection, Voltaren gel, and quad strengthening.  If not improving in 6 weeks would consider MRI to further characterize potential source of pain including potential meniscus injury and for potential surgical planning.  Additionally could consider hyaluronic acid injections in the future as well however would like to proceed with MRI before we do that.[name]  PDMP not reviewed this encounter. Orders Placed This Encounter  Procedures   [place] LIMITED JOINT SPACE STRUCTURES LOW LEFT(NO LINKED CHARGES)    Standing Status:   Future    Number of Occurrences:   1    Standing Expiration Date:  01/04/2022    Order Specific Question:   Reason for Exam (SYMPTOM  OR DIAGNOSIS REQUIRED)    Answer:   left knee pain    Order Specific Question:   Preferred imaging location?    Answer:   [facility]   DG Knee AP/LAT W/[place] Left     Standing Status:   Future    Number of Occurrences:   1    Standing Expiration Date:   07/04/2022    Order Specific Question:   Reason for Exam (SYMPTOM  OR DIAGNOSIS REQUIRED)    Answer:   left knee pain    Order Specific Question:   Preferred imaging location?    Answer:   [facility]   No orders of the defined types were placed in this encounter.   Discussed warning signs or symptoms. Please see discharge instructions. Patient expresses understanding.   The above documentation has been reviewed and is accurate and complete [name], M.D.

## 2021-07-04 ENCOUNTER — Other Ambulatory Visit: Payer: Self-pay

## 2021-07-04 ENCOUNTER — Ambulatory Visit (INDEPENDENT_AMBULATORY_CARE_PROVIDER_SITE_OTHER): Payer: Medicare Other

## 2021-07-04 ENCOUNTER — Encounter: Payer: Self-pay | Admitting: Family Medicine

## 2021-07-04 ENCOUNTER — Encounter: Payer: Self-pay | Admitting: Internal Medicine

## 2021-07-04 ENCOUNTER — Ambulatory Visit: Payer: Self-pay

## 2021-07-04 ENCOUNTER — Ambulatory Visit (INDEPENDENT_AMBULATORY_CARE_PROVIDER_SITE_OTHER): Payer: Medicare Other | Admitting: Family Medicine

## 2021-07-04 VITALS — BP 142/88 | HR 89 | Ht 73.0 in | Wt 182.6 lb

## 2021-07-04 DIAGNOSIS — G8929 Other chronic pain: Secondary | ICD-10-CM

## 2021-07-04 DIAGNOSIS — M25562 Pain in left knee: Secondary | ICD-10-CM

## 2021-07-04 MED ORDER — ATORVASTATIN CALCIUM 20 MG PO TABS: 20.0000 mg | ORAL_TABLET | Freq: Every day | ORAL | 3 refills | Status: DC

## 2021-07-04 NOTE — Addendum Note (Signed)
Addended byDamita Dunnings D on: 07/04/2021 07:43 AM   Modules accepted: Orders

## 2021-07-04 NOTE — Patient Instructions (Addendum)
Thank you for coming in today. HAPPY BIRTHDAY!!  Please get an Xray today before you leave   Please use Voltaren gel (Generic Diclofenac Gel) up to 4x daily for pain as needed.  This is available over-the-counter as both the name brand Voltaren gel and the generic diclofenac gel.   Call or go to the ER if you develop a large red swollen joint with extreme pain or oozing puss.    Recheck in about 6 weeks or so.

## 2021-07-05 NOTE — Progress Notes (Signed)
Left knee x-ray shows some mild arthritis in changes.  Otherwise it looks normal to radiology

## 2021-07-09 ENCOUNTER — Telehealth: Payer: Self-pay | Admitting: *Deleted

## 2021-07-09 NOTE — Telephone Encounter (Signed)
Unable to reach patient for virtual pre-visit. Multiple attempts made. Called went straight to voicemail and mailbox full and not accepting messages. Missed appointment letter sent to patient.

## 2021-07-17 ENCOUNTER — Other Ambulatory Visit (HOSPITAL_BASED_OUTPATIENT_CLINIC_OR_DEPARTMENT_OTHER): Payer: Self-pay

## 2021-07-19 ENCOUNTER — Other Ambulatory Visit: Payer: Self-pay | Admitting: Internal Medicine

## 2021-07-31 ENCOUNTER — Other Ambulatory Visit: Payer: Self-pay

## 2021-07-31 ENCOUNTER — Other Ambulatory Visit: Payer: Self-pay | Admitting: Internal Medicine

## 2021-07-31 ENCOUNTER — Telehealth (INDEPENDENT_AMBULATORY_CARE_PROVIDER_SITE_OTHER): Payer: Medicare Other | Admitting: Internal Medicine

## 2021-07-31 DIAGNOSIS — J40 Bronchitis, not specified as acute or chronic: Secondary | ICD-10-CM | POA: Diagnosis not present

## 2021-07-31 MED ORDER — AZITHROMYCIN 250 MG PO TABS: ORAL_TABLET | ORAL | 0 refills | Status: DC

## 2021-07-31 NOTE — Progress Notes (Signed)
Subjective:    Patient ID: [name], male    DOB: [birth year], 65 y.o.   MRN: [id number]  DOS:  07/31/2021 Type of visit - description: Virtual Visit via Telephone    I connected with above mentioned patient  by telephone and verified that I am speaking with the correct person using two identifiers.  THIS ENCOUNTER IS A VIRTUAL VISIT DUE TO COVID-19 - PATIENT WAS NOT SEEN IN THE OFFICE. PATIENT HAS CONSENTED TO VIRTUAL VISIT / TELEMEDICINE VISIT   Location of patient: home  Location of provider: office  Persons participating in the virtual visit: patient, provider   I discussed the limitations, risks, security and privacy concerns of performing an evaluation and management service by telephone and the availability of in person appointments. I also discussed with the patient that there may be a patient responsible charge related to this service. The patient expressed understanding and agreed to proceed.  Acute Symptoms a started 6 days ago with nasal congestion, postnasal dripping, feeling slightly fatigued. 3 days ago he got tested for COVID and it was negative. He is calling because in addition to the postnasal dripping, he also has developed some chest congestion and cough.  Minimal sputum if any.  Denies fever chills No nausea or vomiting No myalgias  Wife had similar symptoms before him, she was also COVID-negative, she is now better     Review of Systems See above   Past Medical History:  Diagnosis Date   ADD (attention deficit disorder)    adult- per psych   Adenomatous colon polyp    Allergy    Cancer ([place])    basal cell skin cancer    Diverticulitis    Perforated sigmoid diverticulitis    Fall 4-10   fx left hip and wrist   Heart murmur    Echo 2003 "thickened and somehow myomateous MV, mild MR"   HOH (hard of hearing)    Hydrocele 2016   Large, symptomatic left hydrocele, planned hydrocelectomy in 10/2015 with Dr. Diona Fanti   Insomnia    per psych    Wears glasses     Past Surgical History:  Procedure Laterality Date   HIP SURGERY Left 2011   ORIF femur   HYDROCELE EXCISION Left 11/02/2015   Procedure: HYDROCELECTOMY ADULT;  Surgeon: Franchot Gallo, MD;  Location: [facility];  Service: Urology;  Laterality: Left;   LAPAROSCOPIC SIGMOID COLECTOMY N/A 02/11/2013   Procedure: LAPAROSCOPIC SIGMOID COLECTOMY WITH MOBILIZATION SPLENIC FLEXURE;  Surgeon: Madilyn Hook, DO;  Location: [facility];  Service: General;  Laterality: N/A;   nose biopsy  2011   Dr Allyson Sabal, no cancer   POLYPECTOMY     WRIST SURGERY  2011   left    Allergies as of 07/31/2021   No Known Allergies      Medication List        Accurate as of July 31, 2021  4:26 PM. If you have any questions, ask your nurse or doctor.          amphetamine-dextroamphetamine 30 MG 24 hr capsule Commonly known as: ADDERALL XR Take 1 capsule (30 mg total) by mouth every morning.   amphetamine-dextroamphetamine 10 MG tablet Commonly known as: Adderall Take 1 tablet (10 mg total) by mouth daily as needed.   atorvastatin 20 MG tablet Commonly known as: LIPITOR Take 1 tablet (20 mg total) by mouth at bedtime.   azelastine 0.1 % nasal spray Commonly known as: ASTELIN Place 2 sprays into  both nostrils 2 (two) times daily.   azithromycin 250 MG tablet Commonly known as: Zithromax Z-Pak 2 tabs a day the first day, then 1 tab a day x 4 days Started by: Kathlene November, MD   Centrum Ultra Mens Tabs Take by mouth. Three times a week--Monday, Wednesday, Friday   clindamycin 1 % gel Commonly known as: Clindagel Apply topically 2 (two) times daily.   diclofenac sodium 1 % Gel Commonly known as: VOLTAREN Apply 4 g topically 4 (four) times daily as needed.   sildenafil 20 MG tablet Commonly known as: REVATIO TAKE 3 TO 4 TABLETS BY MOUTH AT BEDTIME AS NEEDED   traZODone 100 MG tablet Commonly known as: DESYREL Take 0.5-1 tablets (50-100 mg total) by mouth at  bedtime as needed for sleep.           Objective:   Physical Exam There were no vitals taken for this visit. This is a telephone assessment, alert oriented x3, in no apparent distress, no cough noted.  No vital signs available    Assessment      Assessment ADD,  Insomnia per  [facility] Counseling >>> Rx transferred to PCP 09-2019 Snoring palpable aorta: CT 2014 no AAA H/o diverticulitis, perforated, sigmoid colectomy 2014 H/o Hydrocele 2016, large, symptomatic, saw urology, excised H/o BCC, sees derm Barbae folliculitis, previously dermatology prescribed Ziana, now on Flagyl as needed H/o  heart murmur, echo 2013 --thick MV, mild MR COVID infection 10/2020, s/p MAB  PLAN URI/bronchitis. Symptoms as described above, he had 4 COVID vaccines, recent COVID test negative.  Symptoms consistent with a URI and bronchitis, historically antibiotics help. Plan: Zithromax, rest, fluids, Tylenol, Robitussin. Also has Astelin, continue using it. Call if not gradually better.    I discussed the assessment and treatment plan with the patient. The patient was provided an opportunity to ask questions and all were answered. The patient agreed with the plan and demonstrated an understanding of the instructions.   The patient was advised to call back or seek an in-person evaluation if the symptoms worsen or if the condition fails to improve as anticipated.  I provided 18 minutes of non-face-to-face time during this encounter.  Kathlene November, MD

## 2021-08-01 NOTE — Assessment & Plan Note (Signed)
URI/bronchitis. Symptoms as described above, he had 4 COVID vaccines, recent COVID test negative.  Symptoms consistent with a URI and bronchitis, historically antibiotics help. Plan: Zithromax, rest, fluids, Tylenol, Robitussin. Also has Astelin, continue using it. Call if not gradually better.

## 2021-08-08 ENCOUNTER — Other Ambulatory Visit: Payer: Self-pay | Admitting: Internal Medicine

## 2021-08-09 ENCOUNTER — Encounter: Payer: Medicare Other | Admitting: Internal Medicine

## 2021-08-14 ENCOUNTER — Other Ambulatory Visit: Payer: Medicare Other

## 2021-08-15 ENCOUNTER — Ambulatory Visit: Payer: Medicare Other | Admitting: Family Medicine

## 2021-08-16 ENCOUNTER — Other Ambulatory Visit (INDEPENDENT_AMBULATORY_CARE_PROVIDER_SITE_OTHER): Payer: Medicare Other

## 2021-08-16 ENCOUNTER — Telehealth: Payer: Self-pay | Admitting: Internal Medicine

## 2021-08-16 ENCOUNTER — Other Ambulatory Visit: Payer: Self-pay

## 2021-08-16 DIAGNOSIS — E785 Hyperlipidemia, unspecified: Secondary | ICD-10-CM

## 2021-08-16 LAB — LIPID PANEL
Cholesterol: 179 mg/dL (ref 0–200)
HDL: 94.6 mg/dL (ref >39.00)
LDL Cholesterol: 74 mg/dL (ref 0–99)
NonHDL: 84.42
Total CHOL/HDL Ratio: 2
Triglycerides: 53 mg/dL (ref 0.0–149.0)
VLDL: 10.6 mg/dL (ref 0.0–40.0)

## 2021-08-16 LAB — ALT: ALT: 25 U/L (ref 0–53)

## 2021-08-16 LAB — AST: AST: 30 U/L (ref 0–37)

## 2021-08-16 MED ORDER — AMPHETAMINE-DEXTROAMPHETAMINE 10 MG PO TABS: 10.0000 mg | ORAL_TABLET | Freq: Every day | ORAL | 0 refills | Status: DC | PRN

## 2021-08-16 MED ORDER — AMPHETAMINE-DEXTROAMPHET ER 30 MG PO CP24: 30.0000 mg | ORAL_CAPSULE | ORAL | 0 refills | Status: DC

## 2021-08-16 NOTE — Telephone Encounter (Signed)
Medication:  amphetamine-dextroamphetamine (ADDERALL XR) 30 MG 24 hr capsule  amphetamine-dextroamphetamine (ADDERALL) 10 MG tablet  Has the patient contacted their pharmacy? No. (If no, request that the patient contact the pharmacy for the refill.) (If yes, when and what did the pharmacy advise?)  Preferred Pharmacy (with phone number or street name):  [facility] [place], [address]  [place], [place] [address]  Phone:  [phone]  Fax:  [phone]

## 2021-08-16 NOTE — Telephone Encounter (Signed)
PDMP okay, Rx sent 

## 2021-08-16 NOTE — Telephone Encounter (Signed)
Requesting: Adderall XR 30mg  and Adderall 10mg  Contract:06/27/2021 UDS:06/27/2021 Last Visit: 07/31/2021 Next Visit: 06/27/2022 Last Refill on Adderall XR 30mg : 04/18/2021 #30 and 0RF Last Refill on Adderall 10mg : 04/18/2021 #30 and 0RF  Please Advise

## 2021-08-20 MED ORDER — ATORVASTATIN CALCIUM 20 MG PO TABS: 20.0000 mg | ORAL_TABLET | Freq: Every day | ORAL | 3 refills | Status: DC

## 2021-08-20 NOTE — Addendum Note (Signed)
Addended byDamita Dunnings D on: 08/20/2021 08:26 AM   Modules accepted: Orders

## 2021-09-03 ENCOUNTER — Telehealth: Payer: Self-pay | Admitting: Internal Medicine

## 2021-09-03 MED ORDER — SILDENAFIL CITRATE 20 MG PO TABS
ORAL_TABLET | ORAL | 3 refills | Status: DC
Start: 2021-09-03 — End: 2021-12-18

## 2021-09-03 NOTE — Telephone Encounter (Signed)
Medication: sildenafil (REVATIO) 20 MG tablet   Has the patient contacted their pharmacy? No. (If no, request that the patient contact the pharmacy for the refill.) (If yes, when and what did the pharmacy advise?)  Preferred Pharmacy (with phone number or street name): [facility] [place], [address]  [place], [place] [address]  Phone:  [phone]  Fax:  [phone]   Agent: Please be advised that RX refills may take up to 3 business days. We ask that you follow-up with your pharmacy.

## 2021-09-03 NOTE — Telephone Encounter (Signed)
Rx sent 

## 2021-09-06 ENCOUNTER — Other Ambulatory Visit: Payer: Self-pay

## 2021-09-06 ENCOUNTER — Ambulatory Visit (AMBULATORY_SURGERY_CENTER): Payer: Self-pay

## 2021-09-06 VITALS — Ht 73.0 in | Wt 178.0 lb

## 2021-09-06 DIAGNOSIS — Z86010 Personal history of colonic polyps: Secondary | ICD-10-CM

## 2021-09-06 MED ORDER — PLENVU 140 G PO SOLR: 1.0000 | ORAL | 0 refills | Status: DC

## 2021-09-06 NOTE — Progress Notes (Signed)
No egg or soy allergy known to patient  No issues known to pt with past sedation with any surgeries or procedures Patient denies ever being told they had issues or difficulty with intubation  No FH of Malignant Hyperthermia Pt is not on diet pills Pt is not on home 02  Pt is not on blood thinners  Pt denies issues with constipation  No A fib or A flutter Pt is fully vaccinated for Covid  Medicare Coupon given to pt in [facility] today and NO PA's for preps discussed with pt in [facility] today  Discussed with pt there will be an out-of-pocket cost for prep and that varies from $0 to 70 +  dollars - pt verbalized understanding  Due to the COVID-19 pandemic we are asking patients to follow certain guidelines in PV and the [place]   Pt aware of COVID protocols and [facility] guidelines

## 2021-09-10 ENCOUNTER — Other Ambulatory Visit: Payer: Self-pay

## 2021-09-10 ENCOUNTER — Ambulatory Visit (INDEPENDENT_AMBULATORY_CARE_PROVIDER_SITE_OTHER): Payer: Medicare Other | Admitting: Family Medicine

## 2021-09-10 ENCOUNTER — Encounter: Payer: Self-pay | Admitting: Family Medicine

## 2021-09-10 VITALS — BP 160/90 | HR 85 | Ht 73.0 in | Wt 180.8 lb

## 2021-09-10 DIAGNOSIS — M25562 Pain in left knee: Secondary | ICD-10-CM

## 2021-09-10 DIAGNOSIS — M545 Low back pain, unspecified: Secondary | ICD-10-CM

## 2021-09-10 DIAGNOSIS — G8929 Other chronic pain: Secondary | ICD-10-CM

## 2021-09-10 NOTE — Patient Instructions (Addendum)
Good to see you today.  Work on core strengthening at home.  Let me know if you need Tizanidine or a PT referral and I can prescribe those if needed.  Pilates if you would like to consider- [facility]: [phone]  Follow-up as needed .

## 2021-09-10 NOTE — Progress Notes (Signed)
   I, Wendy Poet, LAT, ATC, am serving as scribe for Dr. [name].  [name] is a 65 y.o. 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  He was last seen by Dr. Georgina Snell on 07/04/21 and had a L knee steroid injection.  Today, pt reports that his L knee is feeling good.  He does still have a dull pain at his L medial knee but notes that overall he feels "fine."  He states that he is able to squat w/ little discomfort.  He also reports recent low back pain and would like to discuss this as well.  Diagnostic testing: L knee XR- 07/04/21  Pertinent review of systems: No fevers or chills  Relevant historical information: History of lap colectomy 2014   Exam:  BP (!) 160/90 (BP Location: Right Arm, Patient Position: Sitting, Cuff Size: Normal)   Pulse 85   Ht 6\' 1"  (1.854 m)   Wt 180 lb 12.8 oz (82 kg)   SpO2 100%   BMI 23.85 kg/m  General: Well Developed, well nourished, and in no acute distress.   MSK: L-spine: Nontender midline.  Mildly tender palpation left lumbar paraspinal musculature. Normal lumbar motion.  Some pain with left lateral flexion. Lower extremity strength reflexes and sensation are intact.     Lab and Radiology Results  L-spine images visible on CT scan abdomen pelvis January 2014 personally and independently interpreted today. 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     Assessment and Plan: 64 y.o. male with left low back pain.  Pain was acute about 2 weeks ago and is now improving with conservative management strategies.  Plan to continue home exercise program and conservative management strategies including heating pad and exercise as tolerated.  Briefly reviewed home exercise program and recommend core strengthening.  Certainly could advance to formal physical therapy or prescription muscle relaxers.  We did discuss prescription muscle relaxers today and  decided to hold off on them for now.  Recheck back as needed.  Knee pain improved watchful waiting.   Discussed warning signs or symptoms. Please see discharge instructions. Patient expresses understanding.   The above documentation has been reviewed and is accurate and complete [name], M.D.

## 2021-09-11 ENCOUNTER — Telehealth: Payer: Self-pay | Admitting: Family Medicine

## 2021-09-11 DIAGNOSIS — M545 Low back pain, unspecified: Secondary | ICD-10-CM

## 2021-09-11 NOTE — Telephone Encounter (Signed)
Pt seen this week and discussed PT. Patient would like to start PT and is requesting Dr. Georgina Snell to order.

## 2021-09-13 NOTE — Telephone Encounter (Signed)
Sent MyChart msg to pt with referral info.

## 2021-09-13 NOTE — Telephone Encounter (Signed)
Physical therapy ordered to [place].

## 2021-09-20 ENCOUNTER — Ambulatory Visit (AMBULATORY_SURGERY_CENTER): Payer: Medicare Other | Admitting: Internal Medicine

## 2021-09-20 ENCOUNTER — Encounter: Payer: Self-pay | Admitting: Internal Medicine

## 2021-09-20 VITALS — BP 128/84 | HR 76 | Temp 96.6°F | Resp 10 | Ht 73.0 in | Wt 178.0 lb

## 2021-09-20 DIAGNOSIS — Z86010 Personal history of colonic polyps: Secondary | ICD-10-CM

## 2021-09-20 MED ORDER — SODIUM CHLORIDE 0.9 % IV SOLN: 500.0000 mL | Freq: Once | INTRAVENOUS | Status: DC

## 2021-09-20 NOTE — Patient Instructions (Signed)
Impression/Recommendations:  Diverticulosis and hemorrhoid handouts given to patient.  Repeat colonoscopy in 5 years surveillance.  Resume previous diet. Continue present medications.  YOU HAD AN ENDOSCOPIC PROCEDURE TODAY AT [facility]:   Refer to the procedure report that was given to you for any specific questions about what was found during the examination.  If the procedure report does not answer your questions, please call your gastroenterologist to clarify.  If you requested that your care partner not be given the details of your procedure findings, then the procedure report has been included in a sealed envelope for you to review at your convenience later.  YOU SHOULD EXPECT: Some feelings of bloating in the abdomen. Passage of more gas than usual.  Walking can help get rid of the air that was put into your GI tract during the procedure and reduce the bloating. If you had a lower endoscopy (such as a colonoscopy or flexible sigmoidoscopy) you may notice spotting of blood in your stool or on the toilet paper. If you underwent a bowel prep for your procedure, you may not have a normal bowel movement for a few days.  Please Note:  You might notice some irritation and congestion in your nose or some drainage.  This is from the oxygen used during your procedure.  There is no need for concern and it should clear up in a day or so.  SYMPTOMS TO REPORT IMMEDIATELY:  Following lower endoscopy (colonoscopy or flexible sigmoidoscopy):  Excessive amounts of blood in the stool  Significant tenderness or worsening of abdominal pains  Swelling of the abdomen that is new, acute  Fever of 100F or higher  For urgent or emergent issues, a gastroenterologist can be reached at any hour by calling [phone]. Do not use MyChart messaging for urgent concerns.    DIET:  We do recommend a small meal at first, but then you may proceed to your regular diet.  Drink plenty of fluids but  you should avoid alcoholic beverages for 24 hours.  ACTIVITY:  You should plan to take it easy for the rest of today and you should NOT DRIVE or use heavy machinery until tomorrow (because of the sedation medicines used during the test).    FOLLOW UP: Our staff will call the number listed on your records 48-72 hours following your procedure to check on you and address any questions or concerns that you may have regarding the information given to you following your procedure. If we do not reach you, we will leave a message.  We will attempt to reach you two times.  During this call, we will ask if you have developed any symptoms of COVID 19. If you develop any symptoms (ie: fever, flu-like symptoms, shortness of breath, cough etc.) before then, please call [phone].  If you test positive for Covid 19 in the 2 weeks post procedure, please call and report this information to [place].    If any biopsies were taken you will be contacted by phone or by letter within the next 1-3 weeks.  Please call us at [phone] if you have not heard about the biopsies in 3 weeks.    SIGNATURES/CONFIDENTIALITY: You and/or your care partner have signed paperwork which will be entered into your electronic medical record.  These signatures attest to the fact that that the information above on your After Visit Summary has been reviewed and is understood.  Full responsibility of the confidentiality of this discharge information lies with  you and/or your care-partner.

## 2021-09-20 NOTE — Progress Notes (Signed)
HISTORY OF PRESENT ILLNESS:  [name] is a 65 y.o. male who presents today for surveillance colonoscopy.  He has a history of multiple adenomatous colon polyps as well as advanced lesions (10 mm tubulovillous adenoma).  Previous examinations 2011, 2014, 2017.  No active complaints.  He does have a history of sigmoid colectomy for diverticular disease  REVIEW OF SYSTEMS:  All non-GI ROS negative.  Past Medical History:  Diagnosis Date   ADD (attention deficit disorder)    adult- per psych   Adenomatous colon polyp    Cancer ([place])    basal cell skin cancer    Diverticulitis    Perforated sigmoid diverticulitis    Fall 02/2009   fx left hip and wrist   Glaucoma    LEFT eye   Heart murmur    Echo 2003 "thickened and somehow myomateous MV, mild MR"   HOH (hard of hearing)    Hydrocele 2016   Large, symptomatic left hydrocele, planned hydrocelectomy in 10/2015 with Dr. Diona Fanti   Hyperlipidemia    on meds   Insomnia    per psych   Seasonal allergies    Wears glasses     Past Surgical History:  Procedure Laterality Date   COLONOSCOPY  2017   JP-MAC-suprep(exc)-TA and benign lymphoid -recall 5 yrs   HIP SURGERY Left 2011   ORIF femur   HYDROCELE EXCISION Left 11/02/2015   Procedure: HYDROCELECTOMY ADULT;  Surgeon: Franchot Gallo, MD;  Location: [facility];  Service: Urology;  Laterality: Left;   LAPAROSCOPIC SIGMOID COLECTOMY N/A 02/11/2013   Procedure: LAPAROSCOPIC SIGMOID COLECTOMY WITH MOBILIZATION SPLENIC FLEXURE;  Surgeon: Madilyn Hook, DO;  Location: [facility];  Service: General;  Laterality: N/A;   nose biopsy  2011   Dr Allyson Sabal, no cancer   POLYPECTOMY  2017   TA and benign lymphoid   WISDOM TOOTH EXTRACTION     WRIST SURGERY Left 2011    Social History [name]  reports that he has never smoked. He has never used smokeless tobacco. He reports that he does not currently use alcohol after a past usage of about 4.0 standard drinks per week. He  reports that he does not use drugs.  family history includes Breast cancer in his mother; Heart failure in his mother; Lung cancer in his father.  No Known Allergies     PHYSICAL EXAMINATION:  Vital signs: BP 112/84   Pulse 95   Temp (!) 96.6 F (35.9 C) (Skin)   Ht _0  (1.854 m)   Wt 178 lb (80.7 kg)   SpO2 96%   BMI 23.48 kg/m  General: Well-developed, well-nourished, no acute distress HEENT: Sclerae are anicteric, conjunctiva pink. Oral mucosa intact Lungs: Clear Heart: Regular Abdomen: soft, nontender, nondistended, no obvious ascites, no peritoneal signs, normal bowel sounds. No organomegaly. Extremities: No edema Psychiatric: alert and oriented x3. Cooperative     ASSESSMENT:  1.  History of multiple and advanced adenomatous colon polyps.  Due for surveillance   PLAN:   1.  Surveillance colonoscopy

## 2021-09-20 NOTE — Progress Notes (Signed)
Pt's states no medical or surgical changes since previsit or office visit. VS assessed by D.T 

## 2021-09-20 NOTE — Op Note (Signed)
[facility] Patient Name: Stephenson Cichy Procedure Date: 09/20/2021 1:46 PM MRN: [id number] Endoscopist: Docia Chuck. Henrene Pastor , MD Age: 65 Referring MD:  Date of Birth: [birth year] Gender: Male Account #: [id number] Procedure:                Colonoscopy Indications:              High risk colon cancer surveillance: Personal                            history of adenoma (10 mm or greater in size), High                            risk colon cancer surveillance: Personal history of                            adenoma with villous component, High risk colon                            cancer surveillance: Personal history of multiple                            (3 or more) adenomas. Previous examinations 2011,                            2014, 2017. Also, status post sigmoid colectomy for                            diverticular disease Medicines:                Monitored Anesthesia Care Procedure:                Pre-Anesthesia Assessment:                           - Prior to the procedure, a History and Physical                            was performed, and patient medications and                            allergies were reviewed. The patient's tolerance of                            previous anesthesia was also reviewed. The risks                            and benefits of the procedure and the sedation                            options and risks were discussed with the patient.                            All questions were answered, and informed consent  was obtained. Prior Anticoagulants: The patient has                            taken no previous anticoagulant or antiplatelet                            agents. ASA Grade Assessment: II - A patient with                            mild systemic disease. After reviewing the risks                            and benefits, the patient was deemed in                            satisfactory condition to undergo the  procedure.                           After obtaining informed consent, the colonoscope                            was passed under direct vision. Throughout the                            procedure, the patient's blood pressure, pulse, and                            oxygen saturations were monitored continuously. The                            Colonoscope was introduced through the anus and                            advanced to the the cecum, identified by                            appendiceal orifice and ileocecal valve. The                            ileocecal valve, appendiceal orifice, and rectum                            were photographed. The quality of the bowel                            preparation was excellent. The colonoscopy was                            performed without difficulty. The patient tolerated                            the procedure well. The bowel preparation used was  Prepopik via split dose instruction. Scope In: 2:04:33 PM Scope Out: 2:12:58 PM Scope Withdrawal Time: 0 hours 6 minutes 43 seconds  Total Procedure Duration: 0 hours 8 minutes 25 seconds  Findings:                 Multiple diverticula were found in the left colon                            and right colon.                           Internal hemorrhoids were found during retroflexion.                           Status post sigmoid colectomy. Anastomosis at                            approximately 20 cm from the anal verge. The exam                            was otherwise without abnormality on direct and                            retroflexion views. Complications:            No immediate complications. Estimated blood loss:                            None. Estimated Blood Loss:     Estimated blood loss: none. Impression:               - Diverticulosis in the left colon and in the right                            colon.                           - Internal  hemorrhoids. Status post sigmoid                            colectomy.                           - The examination was otherwise normal on direct                            and retroflexion views.                           - No specimens collected. Recommendation:           - Repeat colonoscopy in 5 years for surveillance.                           - Patient has a contact number available for                            emergencies. The signs and  symptoms of potential                            delayed complications were discussed with the                            patient. Return to normal activities tomorrow.                            Written discharge instructions were provided to the                            patient.                           - Resume previous diet.                           - Continue present medications. Docia Chuck. Henrene Pastor, MD 09/20/2021 2:17:47 PM This report has been signed electronically.

## 2021-09-20 NOTE — Progress Notes (Signed)
Report to PACU, RN, vss, BBS= Clear.  

## 2021-09-24 ENCOUNTER — Telehealth: Payer: Self-pay

## 2021-09-24 NOTE — Telephone Encounter (Signed)
Secondd attempt follow up call to pt, lm for pt to call if having any problems or questions,

## 2021-09-27 ENCOUNTER — Telehealth: Payer: Self-pay | Admitting: Internal Medicine

## 2021-09-27 MED ORDER — AMPHETAMINE-DEXTROAMPHET ER 30 MG PO CP24: 30.0000 mg | ORAL_CAPSULE | ORAL | 0 refills | Status: DC

## 2021-09-27 MED ORDER — AMPHETAMINE-DEXTROAMPHETAMINE 10 MG PO TABS: 10.0000 mg | ORAL_TABLET | Freq: Every day | ORAL | 0 refills | Status: DC | PRN

## 2021-09-27 NOTE — Telephone Encounter (Signed)
PDMP okay, Rx sent 

## 2021-09-27 NOTE — Telephone Encounter (Signed)
Medication:   amphetamine-dextroamphetamine (ADDERALL) 10 MG tablet      and  amphetamine-dextroamphetamine (ADDERALL XR) 30 MG 24 hr capsule   Has the patient contacted their pharmacy? No.  Preferred Pharmacy: [facility] [place], [address]  [place], [place] [address]  Phone:  [phone]  Fax:  [phone]

## 2021-09-27 NOTE — Telephone Encounter (Signed)
Requesting: adderall xr 30mg  and adderall 10mg  Contract: 06/27/2021 UDS: 06/27/2021 Last Visit: 07/31/2021 Next Visit: 06/27/2022 Last Refill on both: 08/16/2021 30 day supply and 0RF  Please Advise

## 2021-10-02 ENCOUNTER — Ambulatory Visit: Payer: Medicare Other | Attending: Internal Medicine

## 2021-10-02 ENCOUNTER — Other Ambulatory Visit (HOSPITAL_BASED_OUTPATIENT_CLINIC_OR_DEPARTMENT_OTHER): Payer: Self-pay

## 2021-10-02 DIAGNOSIS — Z23 Encounter for immunization: Secondary | ICD-10-CM

## 2021-10-02 MED ORDER — INFLUENZA VAC A&B SA ADJ QUAD 0.5 ML IM PRSY
PREFILLED_SYRINGE | INTRAMUSCULAR | 0 refills | Status: DC
  Filled 2021-10-02: qty 0.5, 1d supply, fill #0

## 2021-10-02 NOTE — Progress Notes (Signed)
   Covid-19 Vaccination Clinic  Name:  RUAL VERMEER    MRN: [id number] DOB: [birth year]  10/02/2021  Mr. Knauff was observed post Covid-19 immunization for 15 minutes without incident. He was provided with Vaccine Information Sheet and instruction to access the V-Safe system.   Mr. Kaeding was instructed to call 911 with any severe reactions post vaccine: Difficulty breathing  Swelling of face and throat  A fast heartbeat  A bad rash all over body  Dizziness and weakness   Immunizations Administered     Name Date Dose VIS Date Route   Pfizer Covid-19 Vaccine Bivalent Booster 10/02/2021 10:29 AM 0.3 mL 07/18/2021 Intramuscular   Manufacturer: [place]   Lot: [id number]   [place]: [phone]

## 2021-10-04 ENCOUNTER — Ambulatory Visit (INDEPENDENT_AMBULATORY_CARE_PROVIDER_SITE_OTHER): Payer: Medicare Other | Admitting: Rehabilitative and Restorative Service Providers"

## 2021-10-04 ENCOUNTER — Encounter: Payer: Self-pay | Admitting: Rehabilitative and Restorative Service Providers"

## 2021-10-04 ENCOUNTER — Other Ambulatory Visit: Payer: Self-pay

## 2021-10-04 DIAGNOSIS — R293 Abnormal posture: Secondary | ICD-10-CM | POA: Diagnosis not present

## 2021-10-04 DIAGNOSIS — M545 Low back pain, unspecified: Secondary | ICD-10-CM | POA: Diagnosis present

## 2021-10-04 DIAGNOSIS — R29898 Other symptoms and signs involving the musculoskeletal system: Secondary | ICD-10-CM | POA: Diagnosis not present

## 2021-10-04 NOTE — Patient Instructions (Signed)
Access Code: [email or web address] URL: [email or web address] Date: 10/04/2021 Prepared by: Gillermo Murdoch  Exercises Prone Press Up - 2 x daily - 7 x weekly - 1 sets - 10 reps - 2-3 sec hold Prone Press Up On Elbows - 2 x daily - 7 x weekly - 1 sets - 3 reps - 30 sec hold Standing Lumbar Extension - 2 x daily - 7 x weekly - 1 sets - 2-3 reps - 2-3 sec hold Prone Quadriceps Stretch with Strap - 2 x daily - 7 x weekly - 1 sets - 3 reps - 30 sec hold Hooklying Hamstring Stretch with Strap - 2 x daily - 7 x weekly - 1 sets - 3 reps - 30 sec hold Supine Piriformis Stretch with Leg Straight - 2 x daily - 7 x weekly - 1 sets - 3 reps - 30 sec hold Seated Quadratus Lumborum Stretch in Chair - 2 x daily - 7 x weekly - 1 sets - 3 reps - 30 sec hold Supine Transversus Abdominis Bracing with Pelvic Floor Contraction - 2 x daily - 7 x weekly - 1 sets - 10 reps - 10sec hold  Patient Education Biomedical scientist Office Posture TENS Unit Trigger Point Dry Needling

## 2021-10-04 NOTE — Therapy (Signed)
[place] [place], [place], [address] Phone: [phone]   Fax:  [phone]  Physical Therapy Evaluation  Patient Details  Name: [name] MRN: [id number] Date of Birth: [birth year] Referring Provider (PT): Dr [name]   Encounter Date: 10/04/2021   PT End of Session - 10/04/21 1210     Visit Number 1    Number of Visits 12    Date for PT Re-Evaluation 11/15/21    Progress Note Due on Visit 10    PT Start Time 1010    PT Stop Time 1102    PT Time Calculation (min) 52 min    Activity Tolerance Patient tolerated treatment well             Past Medical History:  Diagnosis Date   ADD (attention deficit disorder)    adult- per psych   Adenomatous colon polyp    Cancer ([place])    basal cell skin cancer    Diverticulitis    Perforated sigmoid diverticulitis    Fall 02/2009   fx left hip and wrist   Glaucoma    LEFT eye   Heart murmur    Echo 2003 "thickened and somehow myomateous MV, mild MR"   HOH (hard of hearing)    Hydrocele 2016   Large, symptomatic left hydrocele, planned hydrocelectomy in 10/2015 with Dr. Diona Fanti   Hyperlipidemia    on meds   Insomnia    per psych   Seasonal allergies    Wears glasses     Past Surgical History:  Procedure Laterality Date   COLONOSCOPY  2017   JP-MAC-suprep(exc)-TA and benign lymphoid -recall 5 yrs   HIP SURGERY Left 2011   ORIF femur   HYDROCELE EXCISION Left 11/02/2015   Procedure: HYDROCELECTOMY ADULT;  Surgeon: Franchot Gallo, MD;  Location: [facility];  Service: Urology;  Laterality: Left;   LAPAROSCOPIC SIGMOID COLECTOMY N/A 02/11/2013   Procedure: LAPAROSCOPIC SIGMOID COLECTOMY WITH MOBILIZATION SPLENIC FLEXURE;  Surgeon: Madilyn Hook, DO;  Location: [facility];  Service: General;  Laterality: N/A;   nose biopsy  2011   Dr Allyson Sabal, no cancer   POLYPECTOMY  2017   TA and benign lymphoid   WISDOM TOOTH EXTRACTION      WRIST SURGERY Left 2011    There were no vitals filed for this visit.    Subjective Assessment - 10/04/21 1012     Subjective Patient reports that he has had pain in the Lt LB and into the mid back area for the past 3-4 weeks. He played pickle ball and then drove ~ 5 hours. He has had some improvement in the past two weeks but continued pain. History of intermittent back pain over the past 20 yrs with episodes about one time a year and symptoms resolving in a week or two with ice and heat. Always on the Lt side.    Pertinent History Fell from a ladder ~ 10 yrs ago with fx Lt femur and Lt wrist with ORIF both; arthritis; abdominal sx ~ 3 yrs ago    Patient Stated Goals Get rid of the back pain and loosen the tight muscles in the back - avoid recurrent back pain    Currently in Pain? Yes    Pain Score 2     Pain Location Back    Pain Orientation Left;Lower;Mid    Pain Descriptors / Indicators Nagging;Tightness;Aching    Pain Type Acute pain    Pain Radiating  Towards slight tingle to posterior thigh to knee (did feel pain to foot - now resolved)    Pain Onset More than a month ago    Pain Frequency Intermittent    Aggravating Factors  driving; getting up and down    Pain Relieving Factors ice; heat; lying on sofa feet elevated                [facility] PT Assessment - 10/04/21 0001       Assessment   Medical Diagnosis Lt LBP    Referring Provider (PT) Dr [name]    Onset Date/Surgical Date 09/01/21    Hand Dominance Right    Next MD Visit PRN    Prior Therapy after Lt femur fx ~ 10 yrs ago      Precautions   Precautions None      Restrictions   Weight Bearing Restrictions No      Balance Screen   Has the patient fallen in the past 6 months No    Has the patient had a decrease in activity level because of a fear of falling?  No    Is the patient reluctant to leave their home because of a fear of falling?  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    Leisure golf 2 x/wk; driving range; grandkids; some household activities; sofa with legs elevated      Observation/Other Assessments   Focus on Therapeutic Outcomes (FOTO)  52      Sensation   Additional Comments intermittent tingling in the posterior Lt thigh      Posture/Postural Control   Posture Comments rounded in sitting with decreased lumbar lordosis; standing - shoudlers rounded and head forward; decresaed lumbar lordosis; Lt hip forward; Lt knee in slight flexion      AROM   Lumbar Flexion 80% without bending knees    Lumbar Extension 30%    Lumbar - Right Side Bend 70% pinch Rt LB LB    Lumbar - Left Side Bend 75% pull in the Rt LB    Lumbar - Right Rotation 30%    Lumbar - Left Rotation 35%      Strength   Overall Strength Comments WFL's for functional activities not tested resistively      Flexibility   Hamstrings tight Lt 55 deg; Rt 65 deg    Quadriceps tight Lt 100 deg; Rt 110 deg    ITB tight Lt > Rt    Piriformis very tight Lt - mild Rt      Palpation   Spinal mobility hypomobility lumbar and thoracic spine    SI assessment  Lt pelvis fwd compared to Rt    Palpation comment tight Lt hip flexors; piriformis; gluts; Ql; lats; lumbar and thoracic paraspinals                        Objective measurements completed on examination: See above findings.       [facility] Adult PT Treatment/Exercise - 10/04/21 0001       Self-Care   Self-Care Other Self-Care  Comments    Other Self-Care Comments  education re sitting posture and alignment      Therapeutic Activites    Therapeutic Activities Other Therapeutic Activities    Other Therapeutic Activities myofacial ball release  work standing lumbar and posterior hip      Lumbar Exercises: Stretches   Passive Hamstring Stretch Left;2 reps;30 seconds    Passive Hamstring Stretch Limitations supine with strap    Hip Flexor Stretch Limitations add    Standing Extension Limitations add    Prone on Elbows Stretch 1 rep;30 seconds    Press Ups 10 reps    Press Ups Limitations 2-3 sec hold through available pain free range    Quad Stretch Left;2 reps;30 seconds    Quad Stretch Limitations prone with strap    Piriformis Stretch Left;3 reps;30 seconds    Piriformis Stretch Limitations supine travell                     PT Education - 10/04/21 1101     Education Details HEP POC DN posture; office ergonomics    Person(s) Educated Patient    Methods Explanation;Demonstration;Tactile cues;Verbal cues;Handout    Comprehension Verbalized understanding;Returned demonstration;Verbal cues required;Tactile cues required                 PT Long Term Goals - 10/04/21 1217       PT LONG TERM GOAL #1   Title Decrease pain in the LB and Lt LE with patient to reports no pain with functional and recreational activities    Time 6    Period Weeks    Status New    Target Date 11/15/21      PT LONG TERM GOAL #2   Title Increase LB and LE mobility with patient to demonstrate increased tissue extensibility in hip and LE - SLR to 75 deg bilat    Time 6    Period Weeks    Status New    Target Date 11/15/21      PT LONG TERM GOAL #3   Title Patient to demonstrate proper posture and alignment with sitting, standing and for transitional movements    Time 6    Period Weeks    Status New    Target Date 11/15/21      PT LONG TERM GOAL #4   Title Independent in HEP (including aquatic program as indicated)    Time 6    Period Weeks    Status New    Target Date 11/15/21      PT LONG TERM GOAL #5   Title Improve functional limitation score to 71    Time 6    Period Weeks    Status New    Target Date  11/15/21                    Plan - 10/04/21 1211     Clinical Impression Statement Patient presents with ~ 4 wk history of Lt LB abd LE pain after playing golf and then increased with playing pickle ball. Patient has a history of chronic Lt knee pain following fx of Lt femur qirh ORIF ~ 10 yrs ago due to a fall. Patient has abnormal posture in sitting and standing; limited trunk and LE mobility; core weakness; muscular tightness through the Lt QL/lats/gluts/piriformis/quads/hip flexors; limited functional and recreational activities; pain on a daily basis. Patient will benefit from PT to address problems identified.    Stability/Clinical Decision Making Stable/Uncomplicated  Clinical Decision Making Low    Rehab Potential Good    PT Frequency 2x / week    PT Duration 6 weeks    PT Treatment/Interventions ADLs/Self Care Home Management;Aquatic Therapy;Cryotherapy;Electrical Stimulation;Moist Heat;Ultrasound;Functional mobility training;Therapeutic activities;Therapeutic exercise;Balance training;Neuromuscular re-education;Patient/family education;Manual techniques;Passive range of motion;Dry needling;Taping    PT Next Visit Plan review HEP and myofacial ball relesae work; trial of DN to areas of muscular tightness; core stabilization and strengthening; back care education    PT [place]             Patient will benefit from skilled therapeutic intervention in order to improve the following deficits and impairments:  Decreased range of motion, Decreased activity tolerance, Pain, Decreased balance, Hypomobility, Impaired flexibility, Improper body mechanics, Decreased mobility, Decreased strength, Impaired sensation, Postural dysfunction  Visit Diagnosis: Acute left-sided low back pain without sciatica  Other symptoms and signs involving the musculoskeletal system  Abnormal posture     Problem List Patient Active Problem List   Diagnosis Date Noted    Erectile dysfunction 09/19/2020   Abnormality of gait 02/13/2017   PCP NOTES >>>>>>>>>>>>>>>>>>>>>>>>>> 59/47/0761   Folliculitis 51/83/4373   Heart murmur 12/23/2014   Annual physical exam 12/22/2013   Snoring 12/22/2013   Joint pain--feet pain 12/22/2013   Diverticulitis s/p lap colectomy [id number] 02/13/2013   ADD (attention deficit disorder)    Insomnia    Fatigue 07/15/2011    Aideen Fenster Nilda Simmer, PT, MPH  10/04/2021, 12:23 PM  [facility] [place] [address] [place], [place], [address] Phone: [phone]   Fax:  [phone]  Name: [name] MRN: [id number] Date of Birth: 1956-09-28

## 2021-10-08 ENCOUNTER — Other Ambulatory Visit: Payer: Self-pay

## 2021-10-08 ENCOUNTER — Ambulatory Visit (INDEPENDENT_AMBULATORY_CARE_PROVIDER_SITE_OTHER): Payer: Medicare Other | Admitting: Rehabilitative and Restorative Service Providers"

## 2021-10-08 ENCOUNTER — Encounter: Payer: Self-pay | Admitting: Rehabilitative and Restorative Service Providers"

## 2021-10-08 DIAGNOSIS — R29898 Other symptoms and signs involving the musculoskeletal system: Secondary | ICD-10-CM

## 2021-10-08 DIAGNOSIS — M545 Low back pain, unspecified: Secondary | ICD-10-CM

## 2021-10-08 DIAGNOSIS — R293 Abnormal posture: Secondary | ICD-10-CM

## 2021-10-08 NOTE — Patient Instructions (Signed)
Trigger Point Dry Needling  What is Trigger Point Dry Needling (DN)? DN is a physical therapy technique used to treat muscle pain and dysfunction. Specifically, DN helps deactivate muscle trigger points (muscle knots).  A thin filiform needle is used to penetrate the skin and stimulate the underlying trigger point. The goal is for a local twitch response (LTR) to occur and for the trigger point to relax. No medication of any kind is injected during the procedure.   What Does Trigger Point Dry Needling Feel Like?  The procedure feels different for each individual patient. Some patients report that they do not actually feel the needle enter the skin and overall the process is not painful. Very mild bleeding may occur. However, many patients feel a deep cramping in the muscle in which the needle was inserted. This is the local twitch response.   How Will I feel after the treatment? Soreness is normal, and the onset of soreness may not occur for a few hours. Typically this soreness does not last longer than two days.  Bruising is uncommon, however; ice can be used to decrease any possible bruising.  In rare cases feeling tired or nauseous after the treatment is normal. In addition, your symptoms may get worse before they get better, this period will typically not last longer than 24 hours.   What Can I do After My Treatment? Increase your hydration by drinking more water for the next 24 hours. You may place ice or heat on the areas treated that have become sore, however, do not use heat on inflamed or bruised areas. Heat often brings more relief post needling. You can continue your regular activities, but vigorous activity is not recommended initially after the treatment for 24 hours. DN is best combined with other physical therapy such as strengthening, stretching, and other therapies.    Access Code: [email or web address] URL: [email or web address] Date: 10/08/2021 Prepared by: Gillermo Murdoch  Exercises Prone Press Up - 2 x daily - 7 x weekly - 1 sets - 10 reps - 2-3 sec hold Prone Press Up On Elbows - 2 x daily - 7 x weekly - 1 sets - 3 reps - 30 sec hold Standing Lumbar Extension - 2 x daily - 7 x weekly - 1 sets - 2-3 reps - 2-3 sec hold Prone Quadriceps Stretch with Strap - 2 x daily - 7 x weekly - 1 sets - 3 reps - 30 sec hold Hooklying Hamstring Stretch with Strap - 2 x daily - 7 x weekly - 1 sets - 3 reps - 30 sec hold Supine Piriformis Stretch with Leg Straight - 2 x daily - 7 x weekly - 1 sets - 3 reps - 30 sec hold Seated Quadratus Lumborum Stretch in Chair - 2 x daily - 7 x weekly - 1 sets - 3 reps - 30 sec hold Supine Transversus Abdominis Bracing with Pelvic Floor Contraction - 2 x daily - 7 x weekly - 1 sets - 10 reps - 10sec hold Hip Flexor Stretch at Edge of Bed - 2 x daily - 7 x weekly - 1 sets - 3 reps - 30 sec hold

## 2021-10-08 NOTE — Therapy (Signed)
[place], [place], [address] Phone: [phone]   Fax:  [phone]  Physical Therapy Treatment  Patient Details  Name: CODI KERTZ MRN: [id number] Date of Birth: 1956/07/11 Referring Provider (PT): Dr [name]   Encounter Date: 10/08/2021   PT End of Session - 10/08/21 1013     Visit Number 2    Number of Visits 12    Date for PT Re-Evaluation 11/15/21    PT Start Time 1012    PT Stop Time 1100    PT Time Calculation (min) 48 min    Activity Tolerance Patient tolerated treatment well             Past Medical History:  Diagnosis Date   ADD (attention deficit disorder)    adult- per psych   Adenomatous colon polyp    Cancer ([place])    basal cell skin cancer    Diverticulitis    Perforated sigmoid diverticulitis    Fall 02/2009   fx left hip and wrist   Glaucoma    LEFT eye   Heart murmur    Echo 2003 "thickened and somehow myomateous MV, mild MR"   HOH (hard of hearing)    Hydrocele 2016   Large, symptomatic left hydrocele, planned hydrocelectomy in 10/2015 with Dr. Diona Fanti   Hyperlipidemia    on meds   Insomnia    per psych   Seasonal allergies    Wears glasses     Past Surgical History:  Procedure Laterality Date   COLONOSCOPY  2017   JP-MAC-suprep(exc)-TA and benign lymphoid -recall 5 yrs   HIP SURGERY Left 2011   ORIF femur   HYDROCELE EXCISION Left 11/02/2015   Procedure: HYDROCELECTOMY ADULT;  Surgeon: Franchot Gallo, MD;  Location: [facility];  Service: Urology;  Laterality: Left;   LAPAROSCOPIC SIGMOID COLECTOMY N/A 02/11/2013   Procedure: LAPAROSCOPIC SIGMOID COLECTOMY WITH MOBILIZATION SPLENIC FLEXURE;  Surgeon: Madilyn Hook, DO;  Location: [facility];  Service: General;  Laterality: N/A;   nose biopsy  2011   Dr Allyson Sabal, no cancer   POLYPECTOMY  2017   TA and benign lymphoid   WISDOM TOOTH EXTRACTION     WRIST SURGERY Left 2011    There  were no vitals filed for this visit.   Subjective Assessment - 10/08/21 1014     Subjective Dannel reports that he has been doing exercises but has not noticed a difference in the Lt LBP. Had a massage last week and that felt good but did not change symptoms.    Currently in Pain? Yes    Pain Score 2     Pain Location Back    Pain Orientation Left;Lower;Mid    Pain Descriptors / Indicators Nagging;Tightness;Aching    Pain Type Acute pain                               [facility] Adult PT Treatment/Exercise - 10/08/21 0001       Lumbar Exercises: Stretches   Passive Hamstring Stretch Left;2 reps;30 seconds    Passive Hamstring Stretch Limitations supine with strap    Hip Flexor Stretch Left;Right;2 reps;30 seconds    Hip Flexor Stretch Limitations supine modified Thomas    Standing Extension 2 reps;5 seconds    Prone on Elbows Stretch 1 rep;30 seconds    Press Ups 10 reps    Press Ups Limitations 2-3 sec hold  through available pain free range    Quad Stretch Left;2 reps;30 seconds    Quad Stretch Limitations prone with strap    Piriformis Stretch Left;3 reps;30 seconds    Piriformis Stretch Limitations supine travell      Lumbar Exercises: Aerobic   Nustep L6 x 5 min L/UE's      Moist Heat Therapy   Number Minutes Moist Heat 10 Minutes    Moist Heat Location Lumbar Spine;Hip      Electrical Stimulation   Electrical Stimulation Location Lt QL and piriformis    Electrical Stimulation Action mAmp x 5 min; microcurrent x 5 min    Electrical Stimulation Parameters to tolerance    Electrical Stimulation Goals Tone;Pain      Manual Therapy   Manual therapy comments skilled palpation to assess response to DN and manual work    Joint Mobilization PA mobs Lt GT    Soft tissue mobilization deep tissue work through the [employer] and lats into the Lt posterior hip - gluts/piriformis    Myofascial Release posterior Lt hip              Trigger Point Dry Needling -  10/08/21 0001     Consent Given? Yes    Education Handout Provided Yes    Dry Needling Comments Lt    Electrical Stimulation Performed with Dry Needling Yes    Gluteus Medius Response Palpable increased muscle length    Gluteus Maximus Response Palpable increased muscle length    Piriformis Response Palpable increased muscle length    Quadratus Lumborum Response Palpable increased muscle length                   PT Education - 10/08/21 1027     Education Details HEP DN    Person(s) Educated Patient    Methods Explanation;Demonstration;Tactile cues;Verbal cues;Handout    Comprehension Verbalized understanding;Returned demonstration;Verbal cues required;Tactile cues required                 PT Long Term Goals - 10/04/21 1217       PT LONG TERM GOAL #1   Title Decrease pain in the LB and Lt LE with patient to reports no pain with functional and recreational activities    Time 6    Period Weeks    Status New    Target Date 11/15/21      PT LONG TERM GOAL #2   Title Increase LB and LE mobility with patient to demonstrate increased tissue extensibility in hip and LE - SLR to 75 deg bilat    Time 6    Period Weeks    Status New    Target Date 11/15/21      PT LONG TERM GOAL #3   Title Patient to demonstrate proper posture and alignment with sitting, standing and for transitional movements    Time 6    Period Weeks    Status New    Target Date 11/15/21      PT LONG TERM GOAL #4   Title Independent in HEP (including aquatic program as indicated)    Time 6    Period Weeks    Status New    Target Date 11/15/21      PT LONG TERM GOAL #5   Title Improve functional limitation score to 71    Time 6    Period Weeks    Status New    Target Date 11/15/21  Plan - 10/08/21 1020     Clinical Impression Statement Patient has ~ 5 wk history of Lt LB and LE pain after golf/pickle ball. Hx of fx Lt femur and Lt knee pain. Reviewed  exercise and added stretch for hip flexors. Pt tolerated trial of DN well. Will continue with HEP.    Rehab Potential Good    PT Frequency 2x / week    PT Duration 6 weeks    PT Treatment/Interventions ADLs/Self Care Home Management;Aquatic Therapy;Cryotherapy;Electrical Stimulation;Moist Heat;Ultrasound;Functional mobility training;Therapeutic activities;Therapeutic exercise;Balance training;Neuromuscular re-education;Patient/family education;Manual techniques;Passive range of motion;Dry needling;Taping    PT Next Visit Plan review HEP and myofacial ball relesae work; assess response to trial of DN to areas of muscular tightness in Lt QL/posterior hip; core stabilization and strengthening; back care education    PT [place] and Agree with Plan of Care Patient             Patient will benefit from skilled therapeutic intervention in order to improve the following deficits and impairments:     Visit Diagnosis: Acute left-sided low back pain without sciatica  Other symptoms and signs involving the musculoskeletal system  Abnormal posture     Problem List Patient Active Problem List   Diagnosis Date Noted   Erectile dysfunction 09/19/2020   Abnormality of gait 02/13/2017   PCP NOTES >>>>>>>>>>>>>>>>>>>>>>>>>> 29/56/2130   Folliculitis 86/57/8469   Heart murmur 12/23/2014   Annual physical exam 12/22/2013   Snoring 12/22/2013   Joint pain--feet pain 12/22/2013   Diverticulitis s/p lap colectomy [id number] 02/13/2013   ADD (attention deficit disorder)    Insomnia    Fatigue 07/15/2011    Lashaya Kienitz Nilda Simmer, PT, MPH  10/08/2021, 10:58 AM  [facility] [place], [place], [address] Phone: [phone]   Fax:  [phone]  Name: SERAPIO EDELSON MRN: [id number] Date of Birth: 09-09-1956

## 2021-10-10 ENCOUNTER — Telehealth: Payer: Self-pay | Admitting: Internal Medicine

## 2021-10-10 ENCOUNTER — Ambulatory Visit (INDEPENDENT_AMBULATORY_CARE_PROVIDER_SITE_OTHER): Payer: Medicare Other | Admitting: Rehabilitative and Restorative Service Providers"

## 2021-10-10 ENCOUNTER — Encounter: Payer: Self-pay | Admitting: Rehabilitative and Restorative Service Providers"

## 2021-10-10 ENCOUNTER — Other Ambulatory Visit: Payer: Self-pay

## 2021-10-10 DIAGNOSIS — R293 Abnormal posture: Secondary | ICD-10-CM

## 2021-10-10 DIAGNOSIS — R29898 Other symptoms and signs involving the musculoskeletal system: Secondary | ICD-10-CM | POA: Diagnosis not present

## 2021-10-10 DIAGNOSIS — M545 Low back pain, unspecified: Secondary | ICD-10-CM | POA: Diagnosis present

## 2021-10-10 MED ORDER — TRAZODONE HCL 100 MG PO TABS: ORAL_TABLET | ORAL | 3 refills | Status: DC

## 2021-10-10 NOTE — Patient Instructions (Signed)
Access Code: [email or web address] URL: [email or web address] Date: 10/10/2021 Prepared by: Gillermo Murdoch  Exercises Prone Press Up - 2 x daily - 7 x weekly - 1 sets - 10 reps - 2-3 sec hold Prone Press Up On Elbows - 2 x daily - 7 x weekly - 1 sets - 3 reps - 30 sec hold Standing Lumbar Extension - 2 x daily - 7 x weekly - 1 sets - 2-3 reps - 2-3 sec hold Prone Quadriceps Stretch with Strap - 2 x daily - 7 x weekly - 1 sets - 3 reps - 30 sec hold Hooklying Hamstring Stretch with Strap - 2 x daily - 7 x weekly - 1 sets - 3 reps - 30 sec hold Supine Piriformis Stretch with Leg Straight - 2 x daily - 7 x weekly - 1 sets - 3 reps - 30 sec hold Seated Quadratus Lumborum Stretch in Chair - 2 x daily - 7 x weekly - 1 sets - 3 reps - 30 sec hold Supine Transversus Abdominis Bracing with Pelvic Floor Contraction - 2 x daily - 7 x weekly - 1 sets - 10 reps - 10sec hold Hip Flexor Stretch at Edge of Bed - 2 x daily - 7 x weekly - 1 sets - 3 reps - 30 sec hold Gastroc Stretch on Wall - 2 x daily - 7 x weekly - 1 sets - 3 reps - 30 sec hold Soleus Stretch on Wall - 2 x daily - 7 x weekly - 1 sets - 3 reps - 30 sec hold Half Kneeling Hip Flexor Stretch with Sidebend - 1 x daily - 7 x weekly - 1 sets - 3 reps - 30 sec hold

## 2021-10-10 NOTE — Telephone Encounter (Signed)
Rx sent 

## 2021-10-10 NOTE — Therapy (Signed)
[place], [place], [address] Phone: [phone]   Fax:  [phone]  Physical Therapy Treatment  Patient Details  Name: [name] MRN: [id number] Date of Birth: [birth year] Referring Provider (PT): Dr [name]   Encounter Date: 10/10/2021   PT End of Session - 10/10/21 0935     Visit Number 3    Number of Visits 12    Date for PT Re-Evaluation 11/15/21    Progress Note Due on Visit 10    PT Start Time 0930    PT Stop Time 1018    PT Time Calculation (min) 48 min    Activity Tolerance Patient tolerated treatment well             Past Medical History:  Diagnosis Date   ADD (attention deficit disorder)    adult- per psych   Adenomatous colon polyp    Cancer ([place])    basal cell skin cancer    Diverticulitis    Perforated sigmoid diverticulitis    Fall 02/2009   fx left hip and wrist   Glaucoma    LEFT eye   Heart murmur    Echo 2003 "thickened and somehow myomateous MV, mild MR"   HOH (hard of hearing)    Hydrocele 2016   Large, symptomatic left hydrocele, planned hydrocelectomy in 10/2015 with Dr. Diona Fanti   Hyperlipidemia    on meds   Insomnia    per psych   Seasonal allergies    Wears glasses     Past Surgical History:  Procedure Laterality Date   COLONOSCOPY  2017   JP-MAC-suprep(exc)-TA and benign lymphoid -recall 5 yrs   HIP SURGERY Left 2011   ORIF femur   HYDROCELE EXCISION Left 11/02/2015   Procedure: HYDROCELECTOMY ADULT;  Surgeon: Franchot Gallo, MD;  Location: [facility];  Service: Urology;  Laterality: Left;   LAPAROSCOPIC SIGMOID COLECTOMY N/A 02/11/2013   Procedure: LAPAROSCOPIC SIGMOID COLECTOMY WITH MOBILIZATION SPLENIC FLEXURE;  Surgeon: Madilyn Hook, DO;  Location: [facility];  Service: General;  Laterality: N/A;   nose biopsy  2011   Dr Allyson Sabal, no cancer   POLYPECTOMY  2017   TA and benign lymphoid   WISDOM TOOTH EXTRACTION      WRIST SURGERY Left 2011    There were no vitals filed for this visit.   Subjective Assessment - 10/10/21 0931     Subjective DN was OK - seems to have loosenedhim up a little. Feels he is making incremental progress. Working on his exercises and can tell maybe a little difference.    Currently in Pain? Yes    Pain Score 2     Pain Location Back    Pain Orientation Left;Lower;Mid    Pain Descriptors / Indicators Tightness;Nagging;Aching    Pain Radiating Towards having only intermittent, infrequent having tingling in the Lt posterior thigh to knee                               [facility] Adult PT Treatment/Exercise - 10/10/21 0001       Lumbar Exercises: Stretches   Passive Hamstring Stretch Left;2 reps;30 seconds    Passive Hamstring Stretch Limitations supine with strap    Hip Flexor Stretch Left;Right;2 reps;30 seconds    Hip Flexor Stretch Limitations supine modified Thomas and sitting    Standing Extension 2 reps;5 seconds    Prone on Elbows Stretch  1 rep;30 seconds    Quad Stretch Left;Right;1 rep;20 seconds    Quad Stretch Limitations PT assist    Piriformis Stretch Left;3 reps;30 seconds    Piriformis Stretch Limitations supine travell pt applying counter pressure Lt hip flexors    Gastroc Stretch Left;2 reps;30 seconds;Right;1 rep    Gastroc Stretch Limitations soleus stretch 30 sec x 1 reps Lt LE    Other Lumbar Stretch Exercise lateral trunk flexion in hip flexor position sitting reaching to Rt with Lt UE over head 30 sec hold x 2 reps      Lumbar Exercises: Aerobic   Recumbent Bike L2 x 5 min      Moist Heat Therapy   Number Minutes Moist Heat 10 Minutes    Moist Heat Location Lumbar Spine;Hip      Electrical Stimulation   Electrical Stimulation Location Lt QL and lumbar paraspinals    Electrical Stimulation Action MAmp x 5 min; microcurrent x 5 min    Electrical Stimulation Parameters to tolerance    Electrical Stimulation Goals Tone;Pain       Manual Therapy   Manual therapy comments skilled palpation to assess response to DN and manual work    Joint Mobilization PA mobs Lt GT    Soft tissue mobilization deep tissue work through the [employer] and lats into the Lt posterior hip - gluts/piriformis; lateral hamstrings and ITB    Myofascial Release posterior Lt hip    Passive ROM IR/ER Lt hip pt prone hip extended, knee flexed              Trigger Point Dry Needling - 10/10/21 0001     Consent Given? Yes    Education Handout Provided Previously provided    Dry Needling Comments Lt    Electrical Stimulation Performed with Dry Needling Yes    Hamstring Response Palpable increased muscle length    Gluteus Medius Response Palpable increased muscle length    Gluteus Maximus Response Palpable increased muscle length    Piriformis Response Palpable increased muscle length    Tensor Fascia Lata Response Palpable increased muscle length    Quadratus Lumborum Response Palpable increased muscle length                   PT Education - 10/10/21 0946     Education Details HEP    Person(s) Educated Patient    Methods Explanation;Demonstration;Tactile cues;Verbal cues;Handout    Comprehension Verbalized understanding;Returned demonstration;Verbal cues required;Tactile cues required                 PT Long Term Goals - 10/04/21 1217       PT LONG TERM GOAL #1   Title Decrease pain in the LB and Lt LE with patient to reports no pain with functional and recreational activities    Time 6    Period Weeks    Status New    Target Date 11/15/21      PT LONG TERM GOAL #2   Title Increase LB and LE mobility with patient to demonstrate increased tissue extensibility in hip and LE - SLR to 75 deg bilat    Time 6    Period Weeks    Status New    Target Date 11/15/21      PT LONG TERM GOAL #3   Title Patient to demonstrate proper posture and alignment with sitting, standing and for transitional movements    Time 6     Period Weeks  Status New    Target Date 11/15/21      PT LONG TERM GOAL #4   Title Independent in HEP (including aquatic program as indicated)    Time 6    Period Weeks    Status New    Target Date 11/15/21      PT LONG TERM GOAL #5   Title Improve functional limitation score to 71    Time 6    Period Weeks    Status New    Target Date 11/15/21                   Plan - 10/10/21 4580     Clinical Impression Statement Patient reports gradual progress with stretching at home. Positive response to DN and initial exercise program. Continued with DN and manual work today. Patient jumpy with DN. Reviewed and added exercises. Note improving mobility in Lt hip rotation. Continues to have increased tightness Lt compared to Rt LB and LE.    Rehab Potential Good    PT Frequency 2x / week    PT Duration 6 weeks    PT Treatment/Interventions ADLs/Self Care Home Management;Aquatic Therapy;Cryotherapy;Electrical Stimulation;Moist Heat;Ultrasound;Functional mobility training;Therapeutic activities;Therapeutic exercise;Balance training;Neuromuscular re-education;Patient/family education;Manual techniques;Passive range of motion;Dry needling;Taping    PT Next Visit Plan review HEP and myofacial ball relesae work; continue DN to areas of muscular tightness in Lt QL/posterior hip/thigh; core stabilization and strengthening; back care education -    [place] and Agree with Plan of Care Patient             Patient will benefit from skilled therapeutic intervention in order to improve the following deficits and impairments:     Visit Diagnosis: Acute left-sided low back pain without sciatica  Other symptoms and signs involving the musculoskeletal system  Abnormal posture     Problem List Patient Active Problem List   Diagnosis Date Noted   Erectile dysfunction 09/19/2020   Abnormality of gait 02/13/2017   PCP NOTES >>>>>>>>>>>>>>>>>>>>>>>>>>  99/83/3825   Folliculitis 05/39/7673   Heart murmur 12/23/2014   Annual physical exam 12/22/2013   Snoring 12/22/2013   Joint pain--feet pain 12/22/2013   Diverticulitis s/p lap colectomy [id number] 02/13/2013   ADD (attention deficit disorder)    Insomnia    Fatigue 07/15/2011    Nickalus Thornsberry Nilda Simmer, PT, MPH  10/10/2021, 10:23 AM  [facility] [place], [place], [address] Phone: [phone]   Fax:  [phone]  Name: [name] MRN: [id number] Date of Birth: 1956-04-22

## 2021-10-10 NOTE — Telephone Encounter (Signed)
Medication: traZODone (DESYREL) 100 MG tablet   Has the patient contacted their pharmacy? Yes.    Preferred Pharmacy: [facility] [place] [employer], [place]  [place], [place] [address]  Phone:  [phone]  Fax:  [phone]

## 2021-10-15 ENCOUNTER — Encounter: Payer: Self-pay | Admitting: Rehabilitative and Restorative Service Providers"

## 2021-10-15 ENCOUNTER — Other Ambulatory Visit: Payer: Self-pay

## 2021-10-15 ENCOUNTER — Ambulatory Visit (INDEPENDENT_AMBULATORY_CARE_PROVIDER_SITE_OTHER): Payer: Medicare Other | Admitting: Rehabilitative and Restorative Service Providers"

## 2021-10-15 DIAGNOSIS — M545 Low back pain, unspecified: Secondary | ICD-10-CM | POA: Diagnosis present

## 2021-10-15 DIAGNOSIS — R29898 Other symptoms and signs involving the musculoskeletal system: Secondary | ICD-10-CM

## 2021-10-15 DIAGNOSIS — R293 Abnormal posture: Secondary | ICD-10-CM | POA: Diagnosis not present

## 2021-10-15 NOTE — Patient Instructions (Signed)
Access Code: [email or web address] URL: [email or web address] Date: 10/15/2021 Prepared by: Gillermo Murdoch  Exercises Prone Press Up - 2 x daily - 7 x weekly - 1 sets - 10 reps - 2-3 sec hold Prone Press Up On Elbows - 2 x daily - 7 x weekly - 1 sets - 3 reps - 30 sec hold Standing Lumbar Extension - 2 x daily - 7 x weekly - 1 sets - 2-3 reps - 2-3 sec hold Prone Quadriceps Stretch with Strap - 2 x daily - 7 x weekly - 1 sets - 3 reps - 30 sec hold Hooklying Hamstring Stretch with Strap - 2 x daily - 7 x weekly - 1 sets - 3 reps - 30 sec hold Supine Piriformis Stretch with Leg Straight - 2 x daily - 7 x weekly - 1 sets - 3 reps - 30 sec hold Seated Quadratus Lumborum Stretch in Chair - 2 x daily - 7 x weekly - 1 sets - 3 reps - 30 sec hold Supine Transversus Abdominis Bracing with Pelvic Floor Contraction - 2 x daily - 7 x weekly - 1 sets - 10 reps - 10sec hold Hip Flexor Stretch at Edge of Bed - 2 x daily - 7 x weekly - 1 sets - 3 reps - 30 sec hold Gastroc Stretch on Wall - 2 x daily - 7 x weekly - 1 sets - 3 reps - 30 sec hold Soleus Stretch on Wall - 2 x daily - 7 x weekly - 1 sets - 3 reps - 30 sec hold Half Kneeling Hip Flexor Stretch with Sidebend - 1 x daily - 7 x weekly - 1 sets - 3 reps - 30 sec hold Hip Adductors and Hamstring Stretch with Strap - 2 x daily - 7 x weekly - 1 sets - 3 reps - 30 sec hold

## 2021-10-15 NOTE — Therapy (Signed)
[place], [place], [address] Phone: [phone]   Fax:  [phone]  Physical Therapy Treatment  Patient Details  Name: [name] MRN: [id number] Date of Birth: [birth year] Referring Provider (PT): Dr [name]   Encounter Date: 10/15/2021   PT End of Session - 10/15/21 0930     Visit Number 4    Number of Visits 12    Date for PT Re-Evaluation 11/15/21    Progress Note Due on Visit 10    PT Start Time 0928    PT Stop Time 1017    PT Time Calculation (min) 49 min    Activity Tolerance Patient tolerated treatment well             Past Medical History:  Diagnosis Date   ADD (attention deficit disorder)    adult- per psych   Adenomatous colon polyp    Cancer ([place])    basal cell skin cancer    Diverticulitis    Perforated sigmoid diverticulitis    Fall 02/2009   fx left hip and wrist   Glaucoma    LEFT eye   Heart murmur    Echo 2003 "thickened and somehow myomateous MV, mild MR"   HOH (hard of hearing)    Hydrocele 2016   Large, symptomatic left hydrocele, planned hydrocelectomy in 10/2015 with Dr. Diona Fanti   Hyperlipidemia    on meds   Insomnia    per psych   Seasonal allergies    Wears glasses     Past Surgical History:  Procedure Laterality Date   COLONOSCOPY  2017   JP-MAC-suprep(exc)-TA and benign lymphoid -recall 5 yrs   HIP SURGERY Left 2011   ORIF femur   HYDROCELE EXCISION Left 11/02/2015   Procedure: HYDROCELECTOMY ADULT;  Surgeon: Franchot Gallo, MD;  Location: [facility];  Service: Urology;  Laterality: Left;   LAPAROSCOPIC SIGMOID COLECTOMY N/A 02/11/2013   Procedure: LAPAROSCOPIC SIGMOID COLECTOMY WITH MOBILIZATION SPLENIC FLEXURE;  Surgeon: Madilyn Hook, DO;  Location: [facility];  Service: General;  Laterality: N/A;   nose biopsy  2011   Dr Allyson Sabal, no cancer   POLYPECTOMY  2017   TA and benign lymphoid   WISDOM TOOTH EXTRACTION      WRIST SURGERY Left 2011    There were no vitals filed for this visit.   Subjective Assessment - 10/15/21 0931     Subjective Not to tight from the DN. It has helped everything. Still has pain in the crotch area which is increased with some of the exercises. Played golf Saturday and had no pain. Stretched afterward. Pain is still 2-3/10 - intensity of pain has not changed. Frequency and duration have decreased. Pain is improving and he is looser.    Currently in Pain? Yes    Pain Score 2     Pain Location Back    Pain Orientation Left;Lower;Mid    Pain Descriptors / Indicators Tightness;Nagging;Aching    Pain Type Acute pain    Pain Onset More than a month ago    Pain Frequency Intermittent                [facility] PT Assessment - 10/15/21 0001       Assessment   Medical Diagnosis Lt LBP    Referring Provider (PT) Dr [name]    Onset Date/Surgical Date 09/01/21    Hand Dominance Right    Next MD Visit PRN    Prior  Therapy after Lt femur fx ~ 10 yrs ago      Flexibility   Hamstrings tight Lt 65 deg; Rt 75 deg    Quadriceps tight Lt 100 deg; Rt 110 deg    ITB tight Lt > Rt    Piriformis tight Lt - mild Rt      Palpation   Palpation comment tight Lt adductors; hip flexors; piriformis; gluts; Ql; lats; lumbar and thoracic paraspinals                           [facility] Adult PT Treatment/Exercise - 10/15/21 0001       Therapeutic Activites    Other Therapeutic Activities myofacial ball release work prone hip flexors      Lumbar Exercises: Stretches   Other Lumbar Stretch Exercise hip adductor stretch supine with strap 30 sec x 3 reps      Lumbar Exercises: Aerobic   Nustep L6 x 5 min L/UE's    Other Aerobic Exercise walking laps in gym x 5; backward walking to elongate hip flexors x 20 ft x 5 reps      Moist Heat Therapy   Number Minutes Moist Heat 10 Minutes    Moist Heat Location Lumbar Spine;Hip      Electrical Stimulation   Electrical  Stimulation Location Lt hip adductors    Electrical Stimulation Action mAmp x 5 min; microcurrent x 5 min    Electrical Stimulation Parameters to tolerance    Electrical Stimulation Goals Tone;Pain      Manual Therapy   Manual therapy comments skilled palpation to assess response to DN and manual work    Soft tissue mobilization deep tissue work Lt adductors and hip flexors              Trigger Point Dry Needling - 10/15/21 0001     Consent Given? Yes    Education Handout Provided Previously provided    Dry Needling Comments Lt    Electrical Stimulation Performed with Dry Needling Yes    Adductor Response Palpable increased muscle length;Twitch response elicited                   PT Education - 10/15/21 1000     Education Details HEP; myofacial ball release work hip flexors    Person(s) Educated Patient    Methods Explanation;Demonstration;Tactile cues;Verbal cues;Handout    Comprehension Returned demonstration;Verbal cues required;Verbalized understanding;Tactile cues required                 PT Long Term Goals - 10/04/21 1217       PT LONG TERM GOAL #1   Title Decrease pain in the LB and Lt LE with patient to reports no pain with functional and recreational activities    Time 6    Period Weeks    Status New    Target Date 11/15/21      PT LONG TERM GOAL #2   Title Increase LB and LE mobility with patient to demonstrate increased tissue extensibility in hip and LE - SLR to 75 deg bilat    Time 6    Period Weeks    Status New    Target Date 11/15/21      PT LONG TERM GOAL #3   Title Patient to demonstrate proper posture and alignment with sitting, standing and for transitional movements    Time 6    Period Weeks    Status New  Target Date 11/15/21      PT LONG TERM GOAL #4   Title Independent in HEP (including aquatic program as indicated)    Time 6    Period Weeks    Status New    Target Date 11/15/21      PT LONG TERM GOAL #5    Title Improve functional limitation score to 71    Time 6    Period Weeks    Status New    Target Date 11/15/21                   Plan - 10/15/21 0946     Clinical Impression Statement Continued improvement with decreased tightness and tenderness in the Lt LE and LB. Good response to DN and HEP. Patient has palpable tightness in Lt hip adductors. Patient is progressing well with rehab.    Rehab Potential Good    PT Frequency 2x / week    PT Duration 6 weeks    PT Treatment/Interventions ADLs/Self Care Home Management;Aquatic Therapy;Cryotherapy;Electrical Stimulation;Moist Heat;Ultrasound;Functional mobility training;Therapeutic activities;Therapeutic exercise;Balance training;Neuromuscular re-education;Patient/family education;Manual techniques;Passive range of motion;Dry needling;Taping    PT Next Visit Plan review HEP and myofacial ball relesae work; continue DN to areas of muscular tightness in Lt QL/posterior hip/thigh; core stabilization and strengthening; back care education -    [place] and Agree with Plan of Care Patient             Patient will benefit from skilled therapeutic intervention in order to improve the following deficits and impairments:     Visit Diagnosis: Acute left-sided low back pain without sciatica  Other symptoms and signs involving the musculoskeletal system  Abnormal posture     Problem List Patient Active Problem List   Diagnosis Date Noted   Erectile dysfunction 09/19/2020   Abnormality of gait 02/13/2017   PCP NOTES >>>>>>>>>>>>>>>>>>>>>>>>>> 19/41/7408   Folliculitis 14/48/1856   Heart murmur 12/23/2014   Annual physical exam 12/22/2013   Snoring 12/22/2013   Joint pain--feet pain 12/22/2013   Diverticulitis s/p lap colectomy [id number] 02/13/2013   ADD (attention deficit disorder)    Insomnia    Fatigue 07/15/2011    Leovardo Thoman Nilda Simmer, PT, MPH  10/15/2021, 10:11 AM  [facility] [place], [place], [address] Phone: [phone]   Fax:  [phone]  Name: [name] MRN: [id number] Date of Birth: [birth year]

## 2021-10-18 ENCOUNTER — Other Ambulatory Visit: Payer: Self-pay

## 2021-10-18 ENCOUNTER — Ambulatory Visit (INDEPENDENT_AMBULATORY_CARE_PROVIDER_SITE_OTHER): Payer: Medicare Other | Admitting: Rehabilitative and Restorative Service Providers"

## 2021-10-18 ENCOUNTER — Encounter: Payer: Self-pay | Admitting: Rehabilitative and Restorative Service Providers"

## 2021-10-18 DIAGNOSIS — M545 Low back pain, unspecified: Secondary | ICD-10-CM | POA: Diagnosis present

## 2021-10-18 DIAGNOSIS — R293 Abnormal posture: Secondary | ICD-10-CM | POA: Diagnosis not present

## 2021-10-18 DIAGNOSIS — R29898 Other symptoms and signs involving the musculoskeletal system: Secondary | ICD-10-CM

## 2021-10-18 NOTE — Therapy (Signed)
[name], [place], [address] Phone: [phone]   Fax:  [phone]  Physical Therapy Treatment  Patient Details  Name: [name] MRN: [id number] Date of Birth: 12-21-1955 Referring Provider (PT): Dr [name]   Encounter Date: 10/18/2021   PT End of Session - 10/18/21 0932     Visit Number 5    Number of Visits 12    Date for PT Re-Evaluation 11/15/21    Progress Note Due on Visit 10    PT Start Time 0930    PT Stop Time 1018    PT Time Calculation (min) 48 min    Activity Tolerance Patient tolerated treatment well             Past Medical History:  Diagnosis Date   ADD (attention deficit disorder)    adult- per psych   Adenomatous colon polyp    Cancer ([place])    basal cell skin cancer    Diverticulitis    Perforated sigmoid diverticulitis    Fall 02/2009   fx left hip and wrist   Glaucoma    LEFT eye   Heart murmur    Echo 2003 "thickened and somehow myomateous MV, mild MR"   HOH (hard of hearing)    Hydrocele 2016   Large, symptomatic left hydrocele, planned hydrocelectomy in 10/2015 with Dr. Diona Fanti   Hyperlipidemia    on meds   Insomnia    per psych   Seasonal allergies    Wears glasses     Past Surgical History:  Procedure Laterality Date   COLONOSCOPY  2017   JP-MAC-suprep(exc)-TA and benign lymphoid -recall 5 yrs   HIP SURGERY Left 2011   ORIF femur   HYDROCELE EXCISION Left 11/02/2015   Procedure: HYDROCELECTOMY ADULT;  Surgeon: Franchot Gallo, MD;  Location: [facility];  Service: Urology;  Laterality: Left;   LAPAROSCOPIC SIGMOID COLECTOMY N/A 02/11/2013   Procedure: LAPAROSCOPIC SIGMOID COLECTOMY WITH MOBILIZATION SPLENIC FLEXURE;  Surgeon: Madilyn Hook, DO;  Location: [facility];  Service: General;  Laterality: N/A;   nose biopsy  2011   Dr Allyson Sabal, no cancer   POLYPECTOMY  2017   TA and benign lymphoid   WISDOM TOOTH EXTRACTION      WRIST SURGERY Left 2011    There were no vitals filed for this visit.   Subjective Assessment - 10/18/21 0933     Subjective Loosening up. Not too sore in the inner thigh from DN. Still has tightness in the inner thigh(adductors) and has some tightness in the outer thigh as well. Can "feel my back again" - not happy.    Currently in Pain? Yes    Pain Score 2     Pain Location Back    Pain Orientation Left;Lower;Mid    Pain Descriptors / Indicators Dull;Tightness;Nagging    Pain Type Acute pain                               [facility] Adult PT Treatment/Exercise - 10/18/21 0001       Lumbar Exercises: Stretches   ITB Stretch Left;3 reps;30 seconds    ITB Stretch Limitations supine with strap    Other Lumbar Stretch Exercise hip adductor stretch supine with strap 30 sec x 3 reps      Lumbar Exercises: Aerobic   Nustep L6 x 5 min L/UE's    Other Aerobic Exercise walking laps  in gym x 5; backward walking to elongate hip flexors x 20 ft x 5 reps      Lumbar Exercises: Standing   Wall Slides 10 reps    Wall Slides Limitations 10 sec hold      Lumbar Exercises: Seated   Sit to Stand 10 reps    Sit to Stand Limitations VC to engage core      Moist Heat Therapy   Number Minutes Moist Heat 10 Minutes    Moist Heat Location Lumbar Spine;Hip      Electrical Stimulation   Electrical Stimulation Location Lt ITB    Electrical Stimulation Action mAmp x 5 min; microcurrent x 5 min    Electrical Stimulation Parameters to tolerance    Electrical Stimulation Goals Tone;Pain      Manual Therapy   Manual therapy comments skilled palpation to assess response to DN and manual work    Soft tissue mobilization deep IASTM Lt TFL/ITB              Trigger Point Dry Needling - 10/18/21 0001     Consent Given? Yes    Education Handout Provided Previously provided    Dry Needling Comments Lt    Electrical Stimulation Performed with Dry Needling Yes    Tensor Fascia Lata  Response Palpable increased muscle length                   PT Education - 10/18/21 1003     Education Details HEP    Person(s) Educated Patient    Methods Explanation;Demonstration;Tactile cues;Verbal cues;Handout    Comprehension Verbalized understanding;Returned demonstration;Verbal cues required;Tactile cues required                 PT Long Term Goals - 10/04/21 1217       PT LONG TERM GOAL #1   Title Decrease pain in the LB and Lt LE with patient to reports no pain with functional and recreational activities    Time 6    Period Weeks    Status New    Target Date 11/15/21      PT LONG TERM GOAL #2   Title Increase LB and LE mobility with patient to demonstrate increased tissue extensibility in hip and LE - SLR to 75 deg bilat    Time 6    Period Weeks    Status New    Target Date 11/15/21      PT LONG TERM GOAL #3   Title Patient to demonstrate proper posture and alignment with sitting, standing and for transitional movements    Time 6    Period Weeks    Status New    Target Date 11/15/21      PT LONG TERM GOAL #4   Title Independent in HEP (including aquatic program as indicated)    Time 6    Period Weeks    Status New    Target Date 11/15/21      PT LONG TERM GOAL #5   Title Improve functional limitation score to 71    Time 6    Period Weeks    Status New    Target Date 11/15/21                   Plan - 10/18/21 0935     Clinical Impression Statement Continued mild LBP and tightness through the Lt > Rt LE. Good increase in mobility and movement with DN and stretching. Note tightness in the TFL/ITB.  Needs to work on improving core stability.    Rehab Potential Good    PT Frequency 2x / week    PT Duration 6 weeks    PT Treatment/Interventions ADLs/Self Care Home Management;Aquatic Therapy;Cryotherapy;Electrical Stimulation;Moist Heat;Ultrasound;Functional mobility training;Therapeutic activities;Therapeutic exercise;Balance  training;Neuromuscular re-education;Patient/family education;Manual techniques;Passive range of motion;Dry needling;Taping    PT Next Visit Plan review HEP and myofacial ball relesae work; continue DN to areas of muscular tightness in Lt QL/posterior hip/thigh; core stabilization and strengthening; back care education -    [place] and Agree with Plan of Care Patient             Patient will benefit from skilled therapeutic intervention in order to improve the following deficits and impairments:     Visit Diagnosis: Acute left-sided low back pain without sciatica  Other symptoms and signs involving the musculoskeletal system  Abnormal posture     Problem List Patient Active Problem List   Diagnosis Date Noted   Erectile dysfunction 09/19/2020   Abnormality of gait 02/13/2017   PCP NOTES >>>>>>>>>>>>>>>>>>>>>>>>>> 58/25/1898   Folliculitis 42/08/3127   Heart murmur 12/23/2014   Annual physical exam 12/22/2013   Snoring 12/22/2013   Joint pain--feet pain 12/22/2013   Diverticulitis s/p lap colectomy [id number] 02/13/2013   ADD (attention deficit disorder)    Insomnia    Fatigue 07/15/2011    Demontre Padin Nilda Simmer, PT, MPH  10/18/2021, 12:40 PM  [facility] [place], [place], [address] Phone: [phone]   Fax:  [phone]  Name: [name] MRN: [id number] Date of Birth: Dec 21, 1955

## 2021-10-18 NOTE — Patient Instructions (Signed)
Access Code: [email or web address] URL: [email or web address] Date: 10/18/2021 Prepared by: Gillermo Murdoch  Exercises Prone Press Up - 2 x daily - 7 x weekly - 1 sets - 10 reps - 2-3 sec hold Prone Press Up On Elbows - 2 x daily - 7 x weekly - 1 sets - 3 reps - 30 sec hold Standing Lumbar Extension - 2 x daily - 7 x weekly - 1 sets - 2-3 reps - 2-3 sec hold Prone Quadriceps Stretch with Strap - 2 x daily - 7 x weekly - 1 sets - 3 reps - 30 sec hold Hooklying Hamstring Stretch with Strap - 2 x daily - 7 x weekly - 1 sets - 3 reps - 30 sec hold Supine Piriformis Stretch with Leg Straight - 2 x daily - 7 x weekly - 1 sets - 3 reps - 30 sec hold Seated Quadratus Lumborum Stretch in Chair - 2 x daily - 7 x weekly - 1 sets - 3 reps - 30 sec hold Supine Transversus Abdominis Bracing with Pelvic Floor Contraction - 2 x daily - 7 x weekly - 1 sets - 10 reps - 10sec hold Hip Flexor Stretch at Edge of Bed - 2 x daily - 7 x weekly - 1 sets - 3 reps - 30 sec hold Gastroc Stretch on Wall - 2 x daily - 7 x weekly - 1 sets - 3 reps - 30 sec hold Soleus Stretch on Wall - 2 x daily - 7 x weekly - 1 sets - 3 reps - 30 sec hold Half Kneeling Hip Flexor Stretch with Sidebend - 1 x daily - 7 x weekly - 1 sets - 3 reps - 30 sec hold Hip Adductors and Hamstring Stretch with Strap - 2 x daily - 7 x weekly - 1 sets - 3 reps - 30 sec hold Wall Quarter Squat - 2 x daily - 7 x weekly - 1-2 sets - 10 reps - 5-10 sec hold Sit to Stand - 2 x daily - 7 x weekly - 1 sets - 10 reps - 3-5 sec hold

## 2021-10-19 ENCOUNTER — Other Ambulatory Visit (HOSPITAL_BASED_OUTPATIENT_CLINIC_OR_DEPARTMENT_OTHER): Payer: Self-pay

## 2021-10-19 MED ORDER — PFIZER COVID-19 VAC BIVALENT 30 MCG/0.3ML IM SUSP
INTRAMUSCULAR | 0 refills | Status: DC
  Filled 2021-10-19: qty 0.3, 1d supply, fill #0

## 2021-10-22 ENCOUNTER — Encounter: Payer: Medicare Other | Admitting: Rehabilitative and Restorative Service Providers"

## 2021-10-23 ENCOUNTER — Ambulatory Visit (INDEPENDENT_AMBULATORY_CARE_PROVIDER_SITE_OTHER): Payer: Medicare Other | Admitting: Rehabilitative and Restorative Service Providers"

## 2021-10-23 ENCOUNTER — Other Ambulatory Visit: Payer: Self-pay

## 2021-10-23 ENCOUNTER — Encounter: Payer: Self-pay | Admitting: Rehabilitative and Restorative Service Providers"

## 2021-10-23 DIAGNOSIS — R29898 Other symptoms and signs involving the musculoskeletal system: Secondary | ICD-10-CM

## 2021-10-23 DIAGNOSIS — M545 Low back pain, unspecified: Secondary | ICD-10-CM | POA: Diagnosis present

## 2021-10-23 DIAGNOSIS — R293 Abnormal posture: Secondary | ICD-10-CM

## 2021-10-23 NOTE — Therapy (Signed)
[place], [place], [address] Phone: [phone]   Fax:  [phone]  Physical Therapy Treatment  Patient Details  Name: [name] MRN: [id number] Date of Birth: 06/05/1956 Referring Provider (PT): Dr [name]   Encounter Date: 10/23/2021   PT End of Session - 10/23/21 1106     Visit Number 6    Number of Visits 12    Progress Note Due on Visit 10    PT Start Time 1104    PT Stop Time 5462    PT Time Calculation (min) 49 min    Activity Tolerance Patient tolerated treatment well             Past Medical History:  Diagnosis Date   ADD (attention deficit disorder)    adult- per psych   Adenomatous colon polyp    Cancer ([place])    basal cell skin cancer    Diverticulitis    Perforated sigmoid diverticulitis    Fall 02/2009   fx left hip and wrist   Glaucoma    LEFT eye   Heart murmur    Echo 2003 "thickened and somehow myomateous MV, mild MR"   HOH (hard of hearing)    Hydrocele 2016   Large, symptomatic left hydrocele, planned hydrocelectomy in 10/2015 with Dr. Diona Fanti   Hyperlipidemia    on meds   Insomnia    per psych   Seasonal allergies    Wears glasses     Past Surgical History:  Procedure Laterality Date   COLONOSCOPY  2017   JP-MAC-suprep(exc)-TA and benign lymphoid -recall 5 yrs   HIP SURGERY Left 2011   ORIF femur   HYDROCELE EXCISION Left 11/02/2015   Procedure: HYDROCELECTOMY ADULT;  Surgeon: Franchot Gallo, MD;  Location: [facility];  Service: Urology;  Laterality: Left;   LAPAROSCOPIC SIGMOID COLECTOMY N/A 02/11/2013   Procedure: LAPAROSCOPIC SIGMOID COLECTOMY WITH MOBILIZATION SPLENIC FLEXURE;  Surgeon: Madilyn Hook, DO;  Location: [facility];  Service: General;  Laterality: N/A;   nose biopsy  2011   Dr Allyson Sabal, no cancer   POLYPECTOMY  2017   TA and benign lymphoid   WISDOM TOOTH EXTRACTION     WRIST SURGERY Left 2011    There were  no vitals filed for this visit.   Subjective Assessment - 10/23/21 1106     Subjective Patient reports that his back is feeling better. Now pain seems to be more in the Lt hip.    Currently in Pain? Yes    Pain Score 1     Pain Location Hip    Pain Orientation Left    Pain Descriptors / Indicators Tightness;Sore    Pain Type Acute pain    Pain Radiating Towards no tingling in the Lt posterior thigh to knee    Pain Onset More than a month ago    Pain Frequency Intermittent    Aggravating Factors  long drive    Pain Relieving Factors heat; lying on sofa with feet elevated                [facility] PT Assessment - 10/23/21 0001       Assessment   Medical Diagnosis Lt LBP    Referring Provider (PT) Dr [name]    Onset Date/Surgical Date 09/01/21    Hand Dominance Right    Next MD Visit PRN    Prior Therapy after Lt femur fx ~ 10 yrs ago  [facility]   Lumbar Flexion 80% without bending knees    Lumbar Extension 35%    Lumbar - Right Side Bend 70% pinch Rt LB LB    Lumbar - Left Side Bend 75% pull in the Rt LB    Lumbar - Right Rotation 35%    Lumbar - Left Rotation 35%      Palpation   Palpation comment tight Lt gluts/TFL; adductors; hip flexors; piriformis; gluts; Ql; lats; lumbar and thoracic paraspinals                           [facility] Adult PT Treatment/Exercise - 10/23/21 0001       Lumbar Exercises: Aerobic   Nustep L6 x 7 min L/UE's    Other Aerobic Exercise walking laps in gym x 5; backward walking to elongate hip flexors x 20 ft x 5 reps      Lumbar Exercises: Standing   Side Lunge Limitations side steps to Lt/Rt x 60 ft each side    Wall Slides 10 reps    Wall Slides Limitations holding 10# KB for press out at squat position    Other Standing Lumbar Exercises antirotation green TB x 10 each side      Lumbar Exercises: Seated   Sit to Stand 10 reps    Sit to Stand Limitations holding 10# KB at chest      Electrical Stimulation    Electrical Stimulation Location Lt ITB    Electrical Stimulation Action mAmp x 5 min; microcurrent x 5 min    Electrical Stimulation Parameters to tolerance    Electrical Stimulation Goals Tone;Pain      Manual Therapy   Manual therapy comments skilled palpation to assess response to DN and manual work    Soft tissue mobilization deep tissue work through the Lt posterior hip to lateral thigh in glut and TFL    Myofascial Release posterior Lt hip    Passive ROM IR/ER Lt hip pt prone hip extended, knee flexed              Trigger Point Dry Needling - 10/23/21 0001     Consent Given? Yes    Education Handout Provided Previously provided    Dry Needling Comments Lt    Electrical Stimulation Performed with Dry Needling Yes    Gluteus Minimus Response Palpable increased muscle length;Twitch response elicited    Gluteus Maximus Response Palpable increased muscle length;Twitch response elicited    Tensor Fascia Lata Response Palpable increased muscle length;Twitch response elicited                   PT Education - 10/23/21 1125     Education Details HEP    Person(s) Educated Patient    Methods Explanation;Demonstration;Tactile cues;Verbal cues;Handout    Comprehension Verbalized understanding;Returned demonstration;Verbal cues required;Tactile cues required                 PT Long Term Goals - 10/04/21 1217       PT LONG TERM GOAL #1   Title Decrease pain in the LB and Lt LE with patient to reports no pain with functional and recreational activities    Time 6    Period Weeks    Status New    Target Date 11/15/21      PT LONG TERM GOAL #2   Title Increase LB and LE mobility with patient to demonstrate increased tissue extensibility in hip and LE -  SLR to 75 deg bilat    Time 6    Period Weeks    Status New    Target Date 11/15/21      PT LONG TERM GOAL #3   Title Patient to demonstrate proper posture and alignment with sitting, standing and for  transitional movements    Time 6    Period Weeks    Status New    Target Date 11/15/21      PT LONG TERM GOAL #4   Title Independent in HEP (including aquatic program as indicated)    Time 6    Period Weeks    Status New    Target Date 11/15/21      PT LONG TERM GOAL #5   Title Improve functional limitation score to 71    Time 6    Period Weeks    Status New    Target Date 11/15/21                   Plan - 10/23/21 1109     Clinical Impression Statement Resolving LBP - continued discomfort, discribed as tightness and soreness in the Lt hip and thigh. Patient continues to be consistent with HEP. Progressing with tissue extensibility and ROM. Added core stabilization exercises in clinic and to home program.    Rehab Potential Good    PT Frequency 2x / week    PT Duration 6 weeks    PT Treatment/Interventions ADLs/Self Care Home Management;Aquatic Therapy;Cryotherapy;Electrical Stimulation;Moist Heat;Ultrasound;Functional mobility training;Therapeutic activities;Therapeutic exercise;Balance training;Neuromuscular re-education;Patient/family education;Manual techniques;Passive range of motion;Dry needling;Taping    PT Next Visit Plan review HEP and myofacial ball relesae work; continue DN to areas of muscular tightness in Lt QL/posterior hip/thigh; core stabilization and strengthening; back care education -    [place] and Agree with Plan of Care Patient             Patient will benefit from skilled therapeutic intervention in order to improve the following deficits and impairments:     Visit Diagnosis: Acute left-sided low back pain without sciatica  Other symptoms and signs involving the musculoskeletal system  Abnormal posture     Problem List Patient Active Problem List   Diagnosis Date Noted   Erectile dysfunction 09/19/2020   Abnormality of gait 02/13/2017   PCP NOTES >>>>>>>>>>>>>>>>>>>>>>>>>> 36/62/9476    Folliculitis 54/65/0354   Heart murmur 12/23/2014   Annual physical exam 12/22/2013   Snoring 12/22/2013   Joint pain--feet pain 12/22/2013   Diverticulitis s/p lap colectomy [id number] 02/13/2013   ADD (attention deficit disorder)    Insomnia    Fatigue 07/15/2011    Orpha Dain Nilda Simmer, PT, MPH  10/23/2021, 11:59 AM  [facility] [place], [place], [address] Phone: [phone]   Fax:  [phone]  Name: SAKARI ALKHATIB MRN: [id number] Date of Birth: 10/26/1956

## 2021-10-23 NOTE — Patient Instructions (Signed)
Access Code: [email or web address] URL: [email or web address] Date: 10/23/2021 Prepared by: Gillermo Murdoch  Exercises Prone Press Up - 2 x daily - 7 x weekly - 1 sets - 10 reps - 2-3 sec hold Prone Press Up On Elbows - 2 x daily - 7 x weekly - 1 sets - 3 reps - 30 sec hold Standing Lumbar Extension - 2 x daily - 7 x weekly - 1 sets - 2-3 reps - 2-3 sec hold Prone Quadriceps Stretch with Strap - 2 x daily - 7 x weekly - 1 sets - 3 reps - 30 sec hold Hooklying Hamstring Stretch with Strap - 2 x daily - 7 x weekly - 1 sets - 3 reps - 30 sec hold Supine Piriformis Stretch with Leg Straight - 2 x daily - 7 x weekly - 1 sets - 3 reps - 30 sec hold Seated Quadratus Lumborum Stretch in Chair - 2 x daily - 7 x weekly - 1 sets - 3 reps - 30 sec hold Supine Transversus Abdominis Bracing with Pelvic Floor Contraction - 2 x daily - 7 x weekly - 1 sets - 10 reps - 10sec hold Hip Flexor Stretch at Edge of Bed - 2 x daily - 7 x weekly - 1 sets - 3 reps - 30 sec hold Gastroc Stretch on Wall - 2 x daily - 7 x weekly - 1 sets - 3 reps - 30 sec hold Soleus Stretch on Wall - 2 x daily - 7 x weekly - 1 sets - 3 reps - 30 sec hold Half Kneeling Hip Flexor Stretch with Sidebend - 1 x daily - 7 x weekly - 1 sets - 3 reps - 30 sec hold Hip Adductors and Hamstring Stretch with Strap - 1 x daily - 7 x weekly - 1 sets - 3 reps - 30 sec hold Wall Quarter Squat - 1 x daily - 7 x weekly - 1-2 sets - 10 reps - 5-10 sec hold Sit to Stand - 1 x daily - 7 x weekly - 1 sets - 10 reps - 3-5 sec hold Anti-Rotation Lateral Stepping with Press - 1 x daily - 7 x weekly - 1-2 sets - 10 reps - 2-3 sec hold

## 2021-10-25 ENCOUNTER — Ambulatory Visit (INDEPENDENT_AMBULATORY_CARE_PROVIDER_SITE_OTHER): Payer: Medicare Other | Admitting: Rehabilitative and Restorative Service Providers"

## 2021-10-25 ENCOUNTER — Other Ambulatory Visit: Payer: Self-pay

## 2021-10-25 ENCOUNTER — Encounter: Payer: Self-pay | Admitting: Rehabilitative and Restorative Service Providers"

## 2021-10-25 DIAGNOSIS — R293 Abnormal posture: Secondary | ICD-10-CM

## 2021-10-25 DIAGNOSIS — M545 Low back pain, unspecified: Secondary | ICD-10-CM | POA: Diagnosis present

## 2021-10-25 DIAGNOSIS — R29898 Other symptoms and signs involving the musculoskeletal system: Secondary | ICD-10-CM

## 2021-10-25 NOTE — Therapy (Signed)
[place], [place], [address] Phone: [phone]   Fax:  [phone]  Physical Therapy Treatment  Patient Details  Name: TASEEN MARASIGAN MRN: [id number] Date of Birth: 1956-11-02 Referring Provider (PT): Dr [name]   Encounter Date: 10/25/2021   PT End of Session - 10/25/21 0927     Visit Number 7    Number of Visits 12    Date for PT Re-Evaluation 11/15/21    Progress Note Due on Visit 10    PT Start Time 0928    PT Stop Time 1016    PT Time Calculation (min) 48 min    Activity Tolerance Patient tolerated treatment well             Past Medical History:  Diagnosis Date   ADD (attention deficit disorder)    adult- per psych   Adenomatous colon polyp    Cancer ([place])    basal cell skin cancer    Diverticulitis    Perforated sigmoid diverticulitis    Fall 02/2009   fx left hip and wrist   Glaucoma    LEFT eye   Heart murmur    Echo 2003 "thickened and somehow myomateous MV, mild MR"   HOH (hard of hearing)    Hydrocele 2016   Large, symptomatic left hydrocele, planned hydrocelectomy in 10/2015 with Dr. Diona Fanti   Hyperlipidemia    on meds   Insomnia    per psych   Seasonal allergies    Wears glasses     Past Surgical History:  Procedure Laterality Date   COLONOSCOPY  2017   JP-MAC-suprep(exc)-TA and benign lymphoid -recall 5 yrs   HIP SURGERY Left 2011   ORIF femur   HYDROCELE EXCISION Left 11/02/2015   Procedure: HYDROCELECTOMY ADULT;  Surgeon: Franchot Gallo, MD;  Location: [facility];  Service: Urology;  Laterality: Left;   LAPAROSCOPIC SIGMOID COLECTOMY N/A 02/11/2013   Procedure: LAPAROSCOPIC SIGMOID COLECTOMY WITH MOBILIZATION SPLENIC FLEXURE;  Surgeon: Madilyn Hook, DO;  Location: [facility];  Service: General;  Laterality: N/A;   nose biopsy  2011   Dr Allyson Sabal, no cancer   POLYPECTOMY  2017   TA and benign lymphoid   WISDOM TOOTH EXTRACTION      WRIST SURGERY Left 2011    There were no vitals filed for this visit.   Subjective Assessment - 10/25/21 0928     Subjective Continued prpogress. Some soreness but feeling looser. Has continued tightness and pain in the Lt hip and thigh. Working with ball release and exercises at home.    Currently in Pain? Yes    Pain Score 1     Pain Location Hip    Pain Orientation Left    Pain Descriptors / Indicators Tightness;Sore    Pain Type Acute pain;Chronic pain    Pain Radiating Towards a "little" tingling in the side of the Lt LE  - intermittent symptoms    Pain Onset More than a month ago                               [facility] Adult PT Treatment/Exercise - 10/25/21 0001       Lumbar Exercises: Aerobic   Nustep L6 x 6 min L/UE's    Other Aerobic Exercise walking laps in gym x 5; backward walking to elongate hip flexors x 20 ft x 5 reps  Lumbar Exercises: Standing   Side Lunge Limitations side steps to Lt/Rt x 60 ft x 2 reps each side green TB above knees    Other Standing Lumbar Exercises antirotation green TB x 10 each side      Lumbar Exercises: Supine   Clam 10 reps;3 seconds    Clam Limitations green TB above knees alternating LE's      Moist Heat Therapy   Number Minutes Moist Heat 10 Minutes    Moist Heat Location Lumbar Spine;Hip      Electrical Stimulation   Electrical Stimulation Location Lt piriformis; ITB    Electrical Stimulation Action mAmp x 5 min; microcurrent x 5 min    Electrical Stimulation Parameters to tolerance    Electrical Stimulation Goals Tone;Pain      Manual Therapy   Manual therapy comments skilled palpation to assess response to DN and manual work    Soft tissue mobilization deep tissue work through the [place] posterior hip to lateral thigh in glut and TFL    Myofascial Release posterior Lt hip    Passive ROM IR/ER Lt hip pt prone hip extended, knee flexed - contract relax into ER              Trigger Point Dry Needling  - 10/25/21 0001     Consent Given? Yes    Education Handout Provided Previously provided    Dry Needling Comments Lt    Electrical Stimulation Performed with Dry Needling Yes    Gluteus Minimus Response Palpable increased muscle length;Twitch response elicited    Gluteus Medius Response Palpable increased muscle length    Gluteus Maximus Response Palpable increased muscle length    Tensor Fascia Lata Response Palpable increased muscle length;Twitch response elicited                   PT Education - 10/25/21 0948     Education Details HEP    Person(s) Educated Patient    Methods Explanation;Demonstration;Tactile cues;Verbal cues;Handout    Comprehension Verbalized understanding;Returned demonstration;Verbal cues required;Tactile cues required                 PT Long Term Goals - 10/04/21 1217       PT LONG TERM GOAL #1   Title Decrease pain in the LB and Lt LE with patient to reports no pain with functional and recreational activities    Time 6    Period Weeks    Status New    Target Date 11/15/21      PT LONG TERM GOAL #2   Title Increase LB and LE mobility with patient to demonstrate increased tissue extensibility in hip and LE - SLR to 75 deg bilat    Time 6    Period Weeks    Status New    Target Date 11/15/21      PT LONG TERM GOAL #3   Title Patient to demonstrate proper posture and alignment with sitting, standing and for transitional movements    Time 6    Period Weeks    Status New    Target Date 11/15/21      PT LONG TERM GOAL #4   Title Independent in HEP (including aquatic program as indicated)    Time 6    Period Weeks    Status New    Target Date 11/15/21      PT LONG TERM GOAL #5   Title Improve functional limitation score to 71    Time 6  Period Weeks    Status New    Target Date 11/15/21                   Plan - 10/25/21 0930     Clinical Impression Statement Back pain improved. Tightness, soreness, pain  continue in the Lt hip and thigh. Decreased palpable tightness noted. Good response to DN and manual work. Patient is consistent with his HEP. Continue with core stabilization and strengthening.    Rehab Potential Good    PT Frequency 2x / week    PT Duration 6 weeks    PT Treatment/Interventions ADLs/Self Care Home Management;Aquatic Therapy;Cryotherapy;Electrical Stimulation;Moist Heat;Ultrasound;Functional mobility training;Therapeutic activities;Therapeutic exercise;Balance training;Neuromuscular re-education;Patient/family education;Manual techniques;Passive range of motion;Dry needling;Taping    PT Next Visit Plan review HEP and myofacial ball release work; continue DN to areas of muscular tightness in Lt QL/posterior hip/thigh; core stabilization and strengthening; back care education -    [place] and Agree with Plan of Care Patient             Patient will benefit from skilled therapeutic intervention in order to improve the following deficits and impairments:     Visit Diagnosis: Acute left-sided low back pain without sciatica  Other symptoms and signs involving the musculoskeletal system  Abnormal posture     Problem List Patient Active Problem List   Diagnosis Date Noted   Erectile dysfunction 09/19/2020   Abnormality of gait 02/13/2017   PCP NOTES >>>>>>>>>>>>>>>>>>>>>>>>>> 03/49/1791   Folliculitis 50/56/9794   Heart murmur 12/23/2014   Annual physical exam 12/22/2013   Snoring 12/22/2013   Joint pain--feet pain 12/22/2013   Diverticulitis s/p lap colectomy [id number] 02/13/2013   ADD (attention deficit disorder)    Insomnia    Fatigue 07/15/2011    Nycholas Rayner Nilda Simmer, PT, MPH 10/25/2021, 10:16 AM  [facility] [place], [place], [address] Phone: [phone]   Fax:  [phone]  Name: BENIAH MAGNAN MRN: [id number] Date of Birth: May 26, 1956

## 2021-10-25 NOTE — Patient Instructions (Signed)
Access Code: [email or web address] URL: [email or web address] Date: 10/25/2021 Prepared by: Gillermo Murdoch  Exercises Prone Press Up - 2 x daily - 7 x weekly - 1 sets - 10 reps - 2-3 sec hold Prone Press Up On Elbows - 2 x daily - 7 x weekly - 1 sets - 3 reps - 30 sec hold Standing Lumbar Extension - 2 x daily - 7 x weekly - 1 sets - 2-3 reps - 2-3 sec hold Prone Quadriceps Stretch with Strap - 2 x daily - 7 x weekly - 1 sets - 3 reps - 30 sec hold Hooklying Hamstring Stretch with Strap - 2 x daily - 7 x weekly - 1 sets - 3 reps - 30 sec hold Supine Piriformis Stretch with Leg Straight - 2 x daily - 7 x weekly - 1 sets - 3 reps - 30 sec hold Seated Quadratus Lumborum Stretch in Chair - 2 x daily - 7 x weekly - 1 sets - 3 reps - 30 sec hold Supine Transversus Abdominis Bracing with Pelvic Floor Contraction - 2 x daily - 7 x weekly - 1 sets - 10 reps - 10sec hold Hip Flexor Stretch at Edge of Bed - 2 x daily - 7 x weekly - 1 sets - 3 reps - 30 sec hold Gastroc Stretch on Wall - 2 x daily - 7 x weekly - 1 sets - 3 reps - 30 sec hold Soleus Stretch on Wall - 2 x daily - 7 x weekly - 1 sets - 3 reps - 30 sec hold Half Kneeling Hip Flexor Stretch with Sidebend - 1 x daily - 7 x weekly - 1 sets - 3 reps - 30 sec hold Hip Adductors and Hamstring Stretch with Strap - 1 x daily - 7 x weekly - 1 sets - 3 reps - 30 sec hold Wall Quarter Squat - 1 x daily - 7 x weekly - 1-2 sets - 10 reps - 5-10 sec hold Sit to Stand - 1 x daily - 7 x weekly - 1 sets - 10 reps - 3-5 sec hold Anti-Rotation Lateral Stepping with Press - 1 x daily - 7 x weekly - 1-2 sets - 10 reps - 2-3 sec hold Side Stepping with Resistance at Thighs - 1 x daily - 7 x weekly Hooklying Isometric Clamshell - 2 x daily - 7 x weekly - 1 sets - 10 reps - 3 sec hold

## 2021-11-01 ENCOUNTER — Other Ambulatory Visit: Payer: Self-pay

## 2021-11-01 ENCOUNTER — Ambulatory Visit (INDEPENDENT_AMBULATORY_CARE_PROVIDER_SITE_OTHER): Payer: Medicare Other | Admitting: Rehabilitative and Restorative Service Providers"

## 2021-11-01 ENCOUNTER — Encounter: Payer: Self-pay | Admitting: Rehabilitative and Restorative Service Providers"

## 2021-11-01 DIAGNOSIS — R29898 Other symptoms and signs involving the musculoskeletal system: Secondary | ICD-10-CM | POA: Diagnosis not present

## 2021-11-01 DIAGNOSIS — M545 Low back pain, unspecified: Secondary | ICD-10-CM

## 2021-11-01 DIAGNOSIS — R293 Abnormal posture: Secondary | ICD-10-CM | POA: Diagnosis not present

## 2021-11-01 NOTE — Therapy (Signed)
[place], [place], [address] Phone: [phone]   Fax:  [phone]  Physical Therapy Treatment  Patient Details  Name: [name] MRN: [id number] Date of Birth: 1956-10-24 Referring Provider (PT): Dr [name]   Encounter Date: 11/01/2021   PT End of Session - 11/01/21 0929     Visit Number 8    Number of Visits 12    Date for PT Re-Evaluation 11/15/21    Progress Note Due on Visit 10    PT Start Time 0927    PT Stop Time 1017    PT Time Calculation (min) 50 min    Activity Tolerance Patient tolerated treatment well             Past Medical History:  Diagnosis Date   ADD (attention deficit disorder)    adult- per psych   Adenomatous colon polyp    Cancer ([place])    basal cell skin cancer    Diverticulitis    Perforated sigmoid diverticulitis    Fall 02/2009   fx left hip and wrist   Glaucoma    LEFT eye   Heart murmur    Echo 2003 "thickened and somehow myomateous MV, mild MR"   HOH (hard of hearing)    Hydrocele 2016   Large, symptomatic left hydrocele, planned hydrocelectomy in 10/2015 with Dr. Diona Fanti   Hyperlipidemia    on meds   Insomnia    per psych   Seasonal allergies    Wears glasses     Past Surgical History:  Procedure Laterality Date   COLONOSCOPY  2017   JP-MAC-suprep(exc)-TA and benign lymphoid -recall 5 yrs   HIP SURGERY Left 2011   ORIF femur   HYDROCELE EXCISION Left 11/02/2015   Procedure: HYDROCELECTOMY ADULT;  Surgeon: Franchot Gallo, MD;  Location: [facility];  Service: Urology;  Laterality: Left;   LAPAROSCOPIC SIGMOID COLECTOMY N/A 02/11/2013   Procedure: LAPAROSCOPIC SIGMOID COLECTOMY WITH MOBILIZATION SPLENIC FLEXURE;  Surgeon: Madilyn Hook, DO;  Location: [facility];  Service: General;  Laterality: N/A;   nose biopsy  2011   Dr Allyson Sabal, no cancer   POLYPECTOMY  2017   TA and benign lymphoid   WISDOM TOOTH EXTRACTION      WRIST SURGERY Left 2011    There were no vitals filed for this visit.   Subjective Assessment - 11/01/21 0930     Subjective Patient reports that he "can feel his leg more". Just sore. Lt thigh - back and hip are "much better. It's moved down"    Currently in Pain? Yes    Pain Score 2     Pain Location Leg    Pain Orientation Left    Pain Descriptors / Indicators Tightness;Sore    Pain Type Acute pain;Chronic pain                [facility] PT Assessment - 11/01/21 0001       Assessment   Medical Diagnosis Lt LBP    Referring Provider (PT) Dr [name]    Onset Date/Surgical Date 09/01/21    Hand Dominance Right    Next MD Visit PRN    Prior Therapy after Lt femur fx ~ 10 yrs ago      Flexibility   Hamstrings tight Lt 70 deg; Rt 80 deg    Quadriceps tight Lt 110 deg; Rt 115 deg    ITB tight Lt > Rt    Piriformis tight  Lt - mild Rt      Palpation   Palpation comment tight Lt gluts/TFL/hamstrings; adductors; hip flexors; piriformis; gluts; Ql; lats; lumbar and thoracic paraspinals                           [facility] Adult PT Treatment/Exercise - 11/01/21 0001       Lumbar Exercises: Stretches   Passive Hamstring Stretch Left;3 reps;30 seconds    Passive Hamstring Stretch Limitations supine with strap; slight aduction and IR    ITB Stretch Left;3 reps;30 seconds    ITB Stretch Limitations supine with strap      Lumbar Exercises: Aerobic   Nustep L6 x 8 min L/UE's    Other Aerobic Exercise walking laps in gym x 5; backward walking to elongate hip flexors x 20 ft x 5 reps      Lumbar Exercises: Standing   Side Lunge Limitations side steps to Lt/Rt x 60 ft x 2 reps each side green TB above knees    Other Standing Lumbar Exercises antirotation blue TB x 10 each side    Other Standing Lumbar Exercises sports cord step back x 10 reps; side steps x 5 each side      Moist Heat Therapy   Number Minutes Moist Heat 10 Minutes    Moist Heat Location Hip;Knee    posterior thigh     Electrical Stimulation   Electrical Stimulation Location Lt hamstring    Electrical Stimulation Action mAmp x 5 min; microcurrent x 5 min    Electrical Stimulation Parameters to tolerance    Electrical Stimulation Goals Tone;Pain      Manual Therapy   Manual therapy comments skilled palpation to assess response to DN and manual work    Joint Mobilization PA mobs Lt GT    Soft tissue mobilization deep tissue work through the Lt posterior hip to lateral thigh in glut and TFL    Myofascial Release posterior Lt thigh    Passive ROM IR/ER Lt hip pt prone hip extended, knee flexed - contract relax into ER              Trigger Point Dry Needling - 11/01/21 0001     Consent Given? Yes    Education Handout Provided Previously provided    Dry Needling Comments Lt    Electrical Stimulation Performed with Dry Needling Yes    Hamstring Response Palpable increased muscle length                        PT Long Term Goals - 10/04/21 1217       PT LONG TERM GOAL #1   Title Decrease pain in the LB and Lt LE with patient to reports no pain with functional and recreational activities    Time 6    Period Weeks    Status New    Target Date 11/15/21      PT LONG TERM GOAL #2   Title Increase LB and LE mobility with patient to demonstrate increased tissue extensibility in hip and LE - SLR to 75 deg bilat    Time 6    Period Weeks    Status New    Target Date 11/15/21      PT LONG TERM GOAL #3   Title Patient to demonstrate proper posture and alignment with sitting, standing and for transitional movements    Time 6    Period Weeks  Status New    Target Date 11/15/21      PT LONG TERM GOAL #4   Title Independent in HEP (including aquatic program as indicated)    Time 6    Period Weeks    Status New    Target Date 11/15/21      PT LONG TERM GOAL #5   Title Improve functional limitation score to 71    Time 6    Period Weeks    Status New     Target Date 11/15/21                   Plan - 11/01/21 0932     Clinical Impression Statement Good improvement in the LB and Lt hip. Continued pain - tightness and soreness in the Lt LE. He is working on [employer]. Continues to respond well to DN, manual work and stretching. Note increasing tissue extensibility through Lt LE - continued tightness in the hamstrings/ITB/posterior hip.    Rehab Potential Good    PT Frequency 2x / week    PT Duration 6 weeks    PT Treatment/Interventions ADLs/Self Care Home Management;Aquatic Therapy;Cryotherapy;Electrical Stimulation;Moist Heat;Ultrasound;Functional mobility training;Therapeutic activities;Therapeutic exercise;Balance training;Neuromuscular re-education;Patient/family education;Manual techniques;Passive range of motion;Dry needling;Taping    PT Next Visit Plan review HEP and myofacial ball release work; continue DN to areas of muscular tightness in Lt QL/posterior hip/thigh; core stabilization and strengthening; back care education -    [place] and Agree with Plan of Care Patient             Patient will benefit from skilled therapeutic intervention in order to improve the following deficits and impairments:     Visit Diagnosis: Acute left-sided low back pain without sciatica  Other symptoms and signs involving the musculoskeletal system  Abnormal posture     Problem List Patient Active Problem List   Diagnosis Date Noted   Erectile dysfunction 09/19/2020   Abnormality of gait 02/13/2017   PCP NOTES >>>>>>>>>>>>>>>>>>>>>>>>>> 67/61/9509   Folliculitis 32/67/1245   Heart murmur 12/23/2014   Annual physical exam 12/22/2013   Snoring 12/22/2013   Joint pain--feet pain 12/22/2013   Diverticulitis s/p lap colectomy [id number] 02/13/2013   ADD (attention deficit disorder)    Insomnia    Fatigue 07/15/2011    Laraya Pestka Nilda Simmer, PT, MPH  11/01/2021, 10:14 AM  [facility] [place], [place], [address] Phone: [phone]   Fax:  [phone]  Name: [name] MRN: [id number] Date of Birth: 1955/11/25

## 2021-11-06 ENCOUNTER — Encounter: Payer: Self-pay | Admitting: Rehabilitative and Restorative Service Providers"

## 2021-11-06 ENCOUNTER — Other Ambulatory Visit: Payer: Self-pay

## 2021-11-06 ENCOUNTER — Ambulatory Visit (INDEPENDENT_AMBULATORY_CARE_PROVIDER_SITE_OTHER): Payer: Medicare Other | Admitting: Rehabilitative and Restorative Service Providers"

## 2021-11-06 DIAGNOSIS — R293 Abnormal posture: Secondary | ICD-10-CM | POA: Diagnosis not present

## 2021-11-06 DIAGNOSIS — M545 Low back pain, unspecified: Secondary | ICD-10-CM | POA: Diagnosis present

## 2021-11-06 DIAGNOSIS — R29898 Other symptoms and signs involving the musculoskeletal system: Secondary | ICD-10-CM

## 2021-11-06 NOTE — Therapy (Signed)
[place], [place], [address] Phone: [phone]   Fax:  [phone]  Physical Therapy Treatment  Patient Details  Name: [name] MRN: [id number] Date of Birth: [birth year] Referring Provider (PT): Dr [name]   Encounter Date: 11/06/2021   PT End of Session - 11/06/21 1013     Visit Number 9    Number of Visits 12    Date for PT Re-Evaluation 11/15/21    Progress Note Due on Visit 10    PT Start Time 1007    PT Stop Time 1100    PT Time Calculation (min) 53 min    Activity Tolerance Patient tolerated treatment well             Past Medical History:  Diagnosis Date   ADD (attention deficit disorder)    adult- per psych   Adenomatous colon polyp    Cancer ([place])    basal cell skin cancer    Diverticulitis    Perforated sigmoid diverticulitis    Fall 02/2009   fx left hip and wrist   Glaucoma    LEFT eye   Heart murmur    Echo 2003 "thickened and somehow myomateous MV, mild MR"   HOH (hard of hearing)    Hydrocele 2016   Large, symptomatic left hydrocele, planned hydrocelectomy in 10/2015 with Dr. Diona Fanti   Hyperlipidemia    on meds   Insomnia    per psych   Seasonal allergies    Wears glasses     Past Surgical History:  Procedure Laterality Date   COLONOSCOPY  2017   JP-MAC-suprep(exc)-TA and benign lymphoid -recall 5 yrs   HIP SURGERY Left 2011   ORIF femur   HYDROCELE EXCISION Left 11/02/2015   Procedure: HYDROCELECTOMY ADULT;  Surgeon: Franchot Gallo, MD;  Location[facility];  Service: Urology;  Laterality: Left;   LAPAROSCOPIC SIGMOID COLECTOMY N/A 02/11/2013   Procedure: LAPAROSCOPIC SIGMOID COLECTOMY WITH MOBILIZATION SPLENIC FLEXURE;  Surgeon: Madilyn Hook, DO;  Location: [facility];  Service: General;  Laterality: N/A;   nose biopsy  2011   Dr Allyson Sabal, no cancer   POLYPECTOMY  2017   TA and benign lymphoid   WISDOM TOOTH EXTRACTION      WRIST SURGERY Left 2011    There were no vitals filed for this visit.   Subjective Assessment - 11/06/21 1020     Subjective Patient reports that he is improving. but still has some tightness in the Lt hip and low back. A little pain. Played golf yesterday and played pretty well.    Currently in Pain? Yes    Pain Score 2     Pain Location Hip    Pain Orientation Left    Pain Descriptors / Indicators Tightness;Discomfort    Pain Type Acute pain;Chronic pain                [facility] PT Assessment - 11/06/21 0001       Assessment   Medical Diagnosis Lt LBP    Referring Provider (PT) Dr [name]    Onset Date/Surgical Date 09/01/21    Hand Dominance Right    Next MD Visit PRN    Prior Therapy after Lt femur fx ~ 10 yrs ago      Palpation   Palpation comment tight Lt gluts/TFL/hamstrings; adductors; hip flexors; piriformis; gluts; Ql; lats; lumbar and thoracic paraspinals  [facility] [place] Adult PT Treatment/Exercise - 11/06/21 0001       Lumbar Exercises: Stretches   ITB Stretch Left;3 reps;30 seconds    ITB Stretch Limitations supine with strap    Piriformis Stretch Left;3 reps;30 seconds    Piriformis Stretch Limitations supine travell pt applying counter pressure Lt hip flexors      Lumbar Exercises: Aerobic   Nustep L6 x 12 min L/UE's    Other Aerobic Exercise walking laps in gym x 5; backward walking to elongate hip flexors x 20 ft x 5 reps      Lumbar Exercises: Standing   Side Lunge Limitations side steps to Lt/Rt x 60 ft x 2 reps each side green TB above knees      Moist Heat Therapy   Number Minutes Moist Heat 10 Minutes    Moist Heat Location Hip;Knee   posterior thigh     Electrical Stimulation   Electrical Stimulation Location QL; glut min/med/max    Electrical Stimulation Action mAmp x 5 min; microcurrent x 5 min    Electrical Stimulation Parameters to tolerance    Electrical Stimulation Goals Tone;Pain      Manual  Therapy   Manual therapy comments skilled palpation to assess response to DN and manual work    Joint Mobilization PA mobs Lt GT    Soft tissue mobilization deep tissue work through the Lt posterior hip in glut and QL    Myofascial Release posterior Lt thigh    Passive ROM IR/ER Lt hip pt prone hip extended, knee flexed - contract relax into ER              Trigger Point Dry Needling - 11/06/21 0001     Consent Given? Yes    Education Handout Provided Previously provided    Dry Needling Comments Lt    Electrical Stimulation Performed with Dry Needling Yes    Gluteus Minimus Response Palpable increased muscle length;Twitch response elicited    Gluteus Medius Response Palpable increased muscle length;Twitch response elicited    Gluteus Maximus Response Palpable increased muscle length;Twitch response elicited    Quadratus Lumborum Response Palpable increased muscle length;Twitch response elicited                        PT Long Term Goals - 10/04/21 1217       PT LONG TERM GOAL #1   Title Decrease pain in the LB and Lt LE with patient to reports no pain with functional and recreational activities    Time 6    Period Weeks    Status New    Target Date 11/15/21      PT LONG TERM GOAL #2   Title Increase LB and LE mobility with patient to demonstrate increased tissue extensibility in hip and LE - SLR to 75 deg bilat    Time 6    Period Weeks    Status New    Target Date 11/15/21      PT LONG TERM GOAL #3   Title Patient to demonstrate proper posture and alignment with sitting, standing and for transitional movements    Time 6    Period Weeks    Status New    Target Date 11/15/21      PT LONG TERM GOAL #4   Title Independent in HEP (including aquatic program as indicated)    Time 6    Period Weeks    Status New    Target Date  11/15/21      PT LONG TERM GOAL #5   Title Improve functional limitation score to 71    Time 6    Period Weeks    Status New     Target Date 11/15/21                   Plan - 11/06/21 1033     Clinical Impression Statement Continued gradual progress with decreasing pain and muscular tightness. Patient has persistent pain and tightness in the Lt QL and gluts. Good response to DN and manual work followed by exercise. Golf swing is improving with better follow through with swing.    Rehab Potential Good    PT Frequency 2x / week    PT Duration 6 weeks    PT Treatment/Interventions ADLs/Self Care Home Management;Aquatic Therapy;Cryotherapy;Electrical Stimulation;Moist Heat;Ultrasound;Functional mobility training;Therapeutic activities;Therapeutic exercise;Balance training;Neuromuscular re-education;Patient/family education;Manual techniques;Passive range of motion;Dry needling;Taping    PT Next Visit Plan review HEP and myofacial ball release work; continue DN to areas of muscular tightness in Lt QL/posterior hip/thigh; core stabilization and strengthening; back care education -    [place] and Agree with Plan of Care Patient             Patient will benefit from skilled therapeutic intervention in order to improve the following deficits and impairments:     Visit Diagnosis: Acute left-sided low back pain without sciatica  Other symptoms and signs involving the musculoskeletal system  Abnormal posture     Problem List Patient Active Problem List   Diagnosis Date Noted   Erectile dysfunction 09/19/2020   Abnormality of gait 02/13/2017   PCP NOTES >>>>>>>>>>>>>>>>>>>>>>>>>> 32/99/2426   Folliculitis 83/41/9622   Heart murmur 12/23/2014   Annual physical exam 12/22/2013   Snoring 12/22/2013   Joint pain--feet pain 12/22/2013   Diverticulitis s/p lap colectomy [id number] 02/13/2013   ADD (attention deficit disorder)    Insomnia    Fatigue 07/15/2011    [name], PT, MPH  11/06/2021, 10:55 AM  [facility] [place], [place], [address] Phone: [phone]   Fax:  [phone]  Name: [name] MRN: [id number] Date of Birth: [birth year]

## 2021-11-08 ENCOUNTER — Encounter: Payer: Medicare Other | Admitting: Rehabilitative and Restorative Service Providers"

## 2021-11-12 ENCOUNTER — Other Ambulatory Visit: Payer: Self-pay | Admitting: Internal Medicine

## 2021-11-21 ENCOUNTER — Encounter: Payer: Medicare Other | Admitting: Rehabilitative and Restorative Service Providers"

## 2021-11-22 ENCOUNTER — Telehealth: Payer: Self-pay | Admitting: Internal Medicine

## 2021-11-22 MED ORDER — AMPHETAMINE-DEXTROAMPHETAMINE 10 MG PO TABS: 10.0000 mg | ORAL_TABLET | Freq: Every day | ORAL | 0 refills | Status: DC | PRN

## 2021-11-22 MED ORDER — AMPHETAMINE-DEXTROAMPHET ER 30 MG PO CP24
30.0000 mg | ORAL_CAPSULE | ORAL | 0 refills | Status: DC
Start: 2021-11-22 — End: 2022-02-18

## 2021-11-22 NOTE — Telephone Encounter (Signed)
Medication: amphetamine-dextroamphetamine (ADDERALL XR) 30 MG 24 hr capsule  amphetamine-dextroamphetamine (ADDERALL) 10 MG tablet  Has the patient contacted their pharmacy? No.   Preferred Pharmacy:    [id number] - [place], [place] - [address], [place] [address] Phone: [phone]  Fax: [phone] 

## 2021-11-22 NOTE — Telephone Encounter (Signed)
Requesting: Adderall XR 30mg  and Adderall 10mg   Contract: 06/27/2021 UDS: 06/27/2021 Last Visit: 07/31/2021 Next Visit: 06/27/2022 Last Refill on Adderall XR: 09/27/2021 #30 and 0RF (x2) Last Refill on Adderall: 09/27/2021 #30 and 0RF (x2)  Please Advise

## 2021-11-22 NOTE — Telephone Encounter (Signed)
PDMP okay, Rx sent x3

## 2021-11-26 ENCOUNTER — Ambulatory Visit: Payer: Medicare Other | Attending: Family Medicine | Admitting: Rehabilitative and Restorative Service Providers"

## 2021-11-26 ENCOUNTER — Encounter: Payer: Self-pay | Admitting: Rehabilitative and Restorative Service Providers"

## 2021-11-26 ENCOUNTER — Other Ambulatory Visit: Payer: Self-pay

## 2021-11-26 DIAGNOSIS — R293 Abnormal posture: Secondary | ICD-10-CM | POA: Diagnosis not present

## 2021-11-26 DIAGNOSIS — R29898 Other symptoms and signs involving the musculoskeletal system: Secondary | ICD-10-CM | POA: Diagnosis not present

## 2021-11-26 DIAGNOSIS — M545 Low back pain, unspecified: Secondary | ICD-10-CM | POA: Insufficient documentation

## 2021-11-26 NOTE — Therapy (Signed)
[place], [place], [address] Phone: [phone]   Fax:  [phone]  Physical Therapy Treatment, Recertification and Medicare 10th visit Note   Patient Details  Name: [name] MRN: [id number] Date of Birth: 1956/09/21 Referring Provider (PT): Dr [name]   Encounter Date: 11/26/2021   PT End of Session - 11/26/21 1020     Visit Number 10    Number of Visits 18    Date for PT Re-Evaluation 01/07/22    Progress Note Due on Visit 33    PT Start Time 1018    PT Stop Time 1100    PT Time Calculation (min) 42 min    Activity Tolerance Patient tolerated treatment well             Past Medical History:  Diagnosis Date   ADD (attention deficit disorder)    adult- per psych   Adenomatous colon polyp    Cancer ([place])    basal cell skin cancer    Diverticulitis    Perforated sigmoid diverticulitis    Fall 02/2009   fx left hip and wrist   Glaucoma    LEFT eye   Heart murmur    Echo 2003 "thickened and somehow myomateous MV, mild MR"   HOH (hard of hearing)    Hydrocele 2016   Large, symptomatic left hydrocele, planned hydrocelectomy in 10/2015 with Dr. Diona Fanti   Hyperlipidemia    on meds   Insomnia    per psych   Seasonal allergies    Wears glasses     Past Surgical History:  Procedure Laterality Date   COLONOSCOPY  2017   JP-MAC-suprep(exc)-TA and benign lymphoid -recall 5 yrs   HIP SURGERY Left 2011   ORIF femur   HYDROCELE EXCISION Left 11/02/2015   Procedure: HYDROCELECTOMY ADULT;  Surgeon: Franchot Gallo, MD;  Location: [facility];  Service: Urology;  Laterality: Left;   LAPAROSCOPIC SIGMOID COLECTOMY N/A 02/11/2013   Procedure: LAPAROSCOPIC SIGMOID COLECTOMY WITH MOBILIZATION SPLENIC FLEXURE;  Surgeon: Madilyn Hook, DO;  Location: [facility];  Service: General;  Laterality: N/A;   nose biopsy  2011   Dr Allyson Sabal, no cancer   POLYPECTOMY  2017   TA and  benign lymphoid   WISDOM TOOTH EXTRACTION     WRIST SURGERY Left 2011    There were no vitals filed for this visit.   Subjective Assessment - 11/26/21 1020     Subjective Patient reports that he is no longer having constant pain. He may have a little tightness in the Lt hip and inner thigh. He is playing golf and continues to do with his exercises. Pleaed with progress.    Patient Stated Goals Get rid of the back pain and loosen the tight muscles in the back - avoid recurrent back pain    Currently in Pain? No/denies    Pain Score 0-No pain    Pain Location Hip    Pain Orientation Left    Pain Descriptors / Indicators Tightness    Pain Type Chronic pain                [facility] PT Assessment - 11/26/21 0001       Assessment   Medical Diagnosis Lt LBP    Referring Provider (PT) Dr [name]    Onset Date/Surgical Date 09/01/21    Hand Dominance Right    Next MD Visit PRN    Prior Therapy after Lt femur  fx ~ 10 yrs ago      Observation/Other Assessments   Focus on Therapeutic Outcomes (FOTO)  83      AROM   Lumbar Flexion 85%    Lumbar Extension 55%    Lumbar - Right Side Bend 80%    Lumbar - Left Side Bend 80%    Lumbar - Right Rotation 55%    Lumbar - Left Rotation 60%      Flexibility   Hamstrings tight Lt 75 deg; Rt 80 deg    Quadriceps tight Lt 110 deg; Rt 115 deg    ITB tight Lt > Rt    Piriformis tight Lt - mild Rt      Palpation   Palpation comment tight Lt gluts/TFL/hamstrings; adductors; hip flexors; piriformis; gluts; Ql; lats; lumbar and thoracic paraspinals                           [facility] Adult PT Treatment/Exercise - 11/26/21 0001       Lumbar Exercises: Stretches   Passive Hamstring Stretch Left;3 reps;30 seconds    Passive Hamstring Stretch Limitations supine with strap; slight aduction and IR    Hip Flexor Stretch Left;Right;2 reps;30 seconds    Hip Flexor Stretch Limitations supine modified Thomas and sitting    Piriformis  Stretch Left;3 reps;30 seconds    Piriformis Stretch Limitations supine travell pt applying counter pressure Lt hip flexors      Lumbar Exercises: Standing   Side Lunge Limitations side steps to Lt/Rt x 60 ft x 2 reps each side green TB above knees    Other Standing Lumbar Exercises antirotation blue TB x 10 each side    Other Standing Lumbar Exercises sports cord step back x 10 reps; side steps x 5 each side      Lumbar Exercises: Seated   Sit to Stand 10 reps    Sit to Stand Limitations holding 10# KB at chest      Moist Heat Therapy   Number Minutes Moist Heat 10 Minutes    Moist Heat Location Hip;Knee   posterior thigh     Electrical Stimulation   Electrical Stimulation Location anterior hip    Electrical Stimulation Action mAmp x 5 min microcurrent x 5 min    Electrical Stimulation Parameters to tolerance    Electrical Stimulation Goals Tone;Pain      Manual Therapy   Manual therapy comments skilled palpation to assess response to DN and manual work    Soft tissue mobilization deep tissue work through the Lt anterior hip in hip flexors and quads    Passive ROM IR/ER Lt hip pt prone hip extended, knee flexed - contract relax into ER              Trigger Point Dry Needling - 11/26/21 0001     Consent Given? Yes    Education Handout Provided Previously provided    Dry Needling Comments Lt    Electrical Stimulation Performed with Dry Needling Yes    Quadriceps Response Palpable increased muscle length                        PT Long Term Goals - 11/26/21 1034       PT LONG TERM GOAL #1   Title Decrease pain in the LB and Lt LE with patient to reports no pain with functional and recreational activities    Time 6  Period Weeks    Status Achieved      PT LONG TERM GOAL #2   Title Increase LB and LE mobility with patient to demonstrate increased tissue extensibility in hip and LE - SLR to 75 deg bilat    Time 6    Period Weeks    Status On-going     Target Date 01/07/22      PT LONG TERM GOAL #3   Title Patient to demonstrate proper posture and alignment with sitting, standing and for transitional movements    Time 6    Period Weeks    Status On-going    Target Date 01/07/22      PT LONG TERM GOAL #4   Title Independent in HEP (including aquatic program as indicated)    Time 6    Period Weeks    Status On-going    Target Date 01/07/22      PT LONG TERM GOAL #5   Title Improve functional limitation score to 71    Time 6    Period Weeks    Status Achieved                   Plan - 11/26/21 1038     Clinical Impression Statement Progressing well toward stated goals of therapy. Patient reports minimal to no pain with functional and recreatioinal activities. He has accomplished part of rehab goals and is working well toward remainded of goals patient will benefit from continued treatment to achieve maximum rehab potential.    Rehab Potential Good    PT Frequency 1x / week    PT Duration 6 weeks    PT Treatment/Interventions ADLs/Self Care Home Management;Aquatic Therapy;Cryotherapy;Electrical Stimulation;Moist Heat;Ultrasound;Functional mobility training;Therapeutic activities;Therapeutic exercise;Balance training;Neuromuscular re-education;Patient/family education;Manual techniques;Passive range of motion;Dry needling;Taping    PT Next Visit Plan review HEP and myofacial ball release work; continue DN to areas of muscular tightness in Lt QL/posterior hip/thigh; core stabilization and strengthening; back care education -    [place] and Agree with Plan of Care Patient             Patient will benefit from skilled therapeutic intervention in order to improve the following deficits and impairments:     Visit Diagnosis: Acute left-sided low back pain without sciatica  Other symptoms and signs involving the musculoskeletal system  Abnormal posture     Problem List Patient  Active Problem List   Diagnosis Date Noted   Erectile dysfunction 09/19/2020   Abnormality of gait 02/13/2017   PCP NOTES >>>>>>>>>>>>>>>>>>>>>>>>>> 12/18/4386   Folliculitis 87/57/9728   Heart murmur 12/23/2014   Annual physical exam 12/22/2013   Snoring 12/22/2013   Joint pain--feet pain 12/22/2013   Diverticulitis s/p lap colectomy [id number] 02/13/2013   ADD (attention deficit disorder)    Insomnia    Fatigue 07/15/2011    Nalaysia Manganiello Nilda Simmer, PT, MPH  11/26/2021, 10:53 AM  [facility] [place] [address] [place], [place], [address] Phone: [phone]   Fax:  [phone]  Name: LOYALTY BRASHIER MRN: [id number] Date of Birth: 30-Apr-1956

## 2021-12-05 ENCOUNTER — Other Ambulatory Visit: Payer: Self-pay

## 2021-12-05 ENCOUNTER — Encounter: Payer: Self-pay | Admitting: Rehabilitative and Restorative Service Providers"

## 2021-12-05 ENCOUNTER — Ambulatory Visit: Payer: Medicare Other | Admitting: Rehabilitative and Restorative Service Providers"

## 2021-12-05 DIAGNOSIS — M545 Low back pain, unspecified: Secondary | ICD-10-CM | POA: Diagnosis not present

## 2021-12-05 DIAGNOSIS — R29898 Other symptoms and signs involving the musculoskeletal system: Secondary | ICD-10-CM

## 2021-12-05 DIAGNOSIS — R293 Abnormal posture: Secondary | ICD-10-CM

## 2021-12-05 NOTE — Therapy (Signed)
[place], [place], [address] Phone: [phone]   Fax:  [phone]  Physical Therapy Treatment  Patient Details  Name: [name] MRN: [id number] Date of Birth: 1956-07-16 Referring Provider ([name]): Dr [name]   Encounter Date: 12/05/2021   [name] End of Session - 12/05/21 1017     Visit Number 11    Number of Visits 18    Date for [name] Re-Evaluation 01/07/22    Progress Note Due on Visit 20    [name] Start Time 1015    [name] Stop Time 1105    [name] Time Calculation (min) 50 min    Activity Tolerance Patient tolerated treatment well             Past Medical History:  Diagnosis Date   ADD (attention deficit disorder)    adult- per psych   Adenomatous colon polyp    Cancer ([place])    basal cell skin cancer    Diverticulitis    Perforated sigmoid diverticulitis    Fall 02/2009   fx left hip and wrist   Glaucoma    LEFT eye   Heart murmur    Echo 2003 "thickened and somehow myomateous MV, mild MR"   HOH (hard of hearing)    Hydrocele 2016   Large, symptomatic left hydrocele, planned hydrocelectomy in 10/2015 with Dr. Diona Fanti   Hyperlipidemia    on meds   Insomnia    per psych   Seasonal allergies    Wears glasses     Past Surgical History:  Procedure Laterality Date   COLONOSCOPY  2017   JP-MAC-suprep(exc)-TA and benign lymphoid -recall 5 yrs   HIP SURGERY Left 2011   ORIF femur   HYDROCELE EXCISION Left 11/02/2015   Procedure: HYDROCELECTOMY ADULT;  Surgeon: Franchot Gallo, MD;  Location: [facility];  Service: Urology;  Laterality: Left;   LAPAROSCOPIC SIGMOID COLECTOMY N/A 02/11/2013   Procedure: LAPAROSCOPIC SIGMOID COLECTOMY WITH MOBILIZATION SPLENIC FLEXURE;  Surgeon: Madilyn Hook, DO;  Location: [facility];  Service: General;  Laterality: N/A;   nose biopsy  2011   Dr Allyson Sabal, no cancer   POLYPECTOMY  2017   TA and benign lymphoid   WISDOM TOOTH EXTRACTION      WRIST SURGERY Left 2011    There were no vitals filed for this visit.   Subjective Assessment - 12/05/21 1018     Subjective Patient reports that he continues to improve. He is walking 9 holes of golf carrying his golf bag. He is still doing his exercises. Still has some tightness and "aching" in the Lt hip and leg.    Currently in Pain? No/denies    Pain Score 0-No pain    Pain Location Hip    Pain Orientation Left    Pain Descriptors / Indicators Aching;Tightness    Pain Type Chronic pain    Pain Onset More than a month ago    Pain Frequency Intermittent    Aggravating Factors  long sitting    Pain Relieving Factors heat; stretching; lying on sofa with feet elevated                [facility] [name] Assessment - 12/05/21 0001       Assessment   Medical Diagnosis Lt LBP    Referring Provider ([name]) Dr [name]    Onset Date/Surgical Date 09/01/21    Hand Dominance Right    Next MD Visit PRN  Prior Therapy after Lt femur fx ~ 10 yrs ago      Palpation   Palpation comment decreasing tight Lt gluts/TFL/hamstrings; adductors; hip flexors; piriformis; gluts; Ql; lats; lumbar and thoracic paraspinals                           [facility] Adult [name] Treatment/Exercise - 12/05/21 0001       Lumbar Exercises: Stretches   ITB Stretch Left;3 reps;30 seconds    ITB Stretch Limitations supine with strap    Piriformis Stretch Left;3 reps;30 seconds    Piriformis Stretch Limitations supine travell [name] applying counter pressure Lt hip flexors      Lumbar Exercises: Aerobic   Nustep L6 x 7 min L/UE's    Other Aerobic Exercise walking laps in gym x 5; backward walking to elongate hip flexors x 20 ft x 5 reps      Lumbar Exercises: Standing   Side Lunge Limitations side steps to Lt/Rt x 60 ft x 2 reps each side green TB above knees    Other Standing Lumbar Exercises antirotation blue TB x 10 each side      Moist Heat Therapy   Number Minutes Moist Heat 10 Minutes    Moist  Heat Location Hip;Knee   posterior thigh     Electrical Stimulation   Electrical Stimulation Location posterior hip and ITB/hamstring    Electrical Stimulation Action mAmp x 5 min; microcurrent x 5 min    Electrical Stimulation Parameters to tolerance    Electrical Stimulation Goals Tone;Pain      Manual Therapy   Manual therapy comments skilled palpation to assess response to DN and manual work    Joint Mobilization PA mobs Lt GT    Soft tissue mobilization deep tissue work through the Lt anterior hip in hip flexors and quads    Myofascial Release posterior hip    Passive ROM IR/ER Lt hip [name] prone hip extended, knee flexed - contract relax into ER              Trigger Point Dry Needling - 12/05/21 0001     Consent Given? Yes    Education Handout Provided Previously provided    Dry Needling Comments Lt    Electrical Stimulation Performed with Dry Needling Yes    Hamstring Response Palpable increased muscle length    Gluteus Minimus Response Palpable increased muscle length;Twitch response elicited    Gluteus Maximus Response Palpable increased muscle length;Twitch response elicited    Tensor Fascia Lata Response Palpable increased muscle length                        [name] Long Term Goals - 11/26/21 1034       [name] LONG TERM GOAL #1   Title Decrease pain in the LB and Lt LE with patient to reports no pain with functional and recreational activities    Time 6    Period Weeks    Status Achieved      [name] LONG TERM GOAL #2   Title Increase LB and LE mobility with patient to demonstrate increased tissue extensibility in hip and LE - SLR to 75 deg bilat    Time 6    Period Weeks    Status On-going    Target Date 01/07/22      [name] LONG TERM GOAL #3   Title Patient to demonstrate proper posture and alignment with sitting, standing  and for transitional movements    Time 6    Period Weeks    Status On-going    Target Date 01/07/22      [name] LONG TERM GOAL #4    Title Independent in HEP (including aquatic program as indicated)    Time 6    Period Weeks    Status On-going    Target Date 01/07/22      [name] LONG TERM GOAL #5   Title Improve functional limitation score to 71    Time 6    Period Weeks    Status Achieved                   Plan - 12/05/21 1040     Clinical Impression Statement Patient continues to report improvement with muscle ache and increased physical activity. He has decreased palpable tightness in Lt posterior hip. Good response to DN and manual work with exercises and home program. Patient is progressing well toward stated goals of therapy. he will benefit from continued treatment to further resolve symptoms and reach maximum rehab potential. Will try to decrease frequency to 1x/wk and assess patient's response to change in frequency.    Rehab Potential Good    [name] Frequency 1x / week    [name] Duration 6 weeks    [name] Treatment/Interventions ADLs/Self Care Home Management;Aquatic Therapy;Cryotherapy;Electrical Stimulation;Moist Heat;Ultrasound;Functional mobility training;Therapeutic activities;Therapeutic exercise;Balance training;Neuromuscular re-education;Patient/family education;Manual techniques;Passive range of motion;Dry needling;Taping    [name] Next Visit Plan review HEP and myofacial ball release work; continue DN to areas of muscular tightness in Lt QL/posterior hip/thigh; core stabilization and strengthening; back care education -    [place] and Agree with Plan of Care Patient             Patient will benefit from skilled therapeutic intervention in order to improve the following deficits and impairments:     Visit Diagnosis: Acute left-sided low back pain without sciatica  Other symptoms and signs involving the musculoskeletal system  Abnormal posture     Problem List Patient Active Problem List   Diagnosis Date Noted   Erectile dysfunction 09/19/2020   Abnormality  of gait 02/13/2017   PCP NOTES >>>>>>>>>>>>>>>>>>>>>>>>>> 16/08/9603   Folliculitis 54/07/8118   Heart murmur 12/23/2014   Annual physical exam 12/22/2013   Snoring 12/22/2013   Joint pain--feet pain 12/22/2013   Diverticulitis s/p lap colectomy [id number] 02/13/2013   ADD (attention deficit disorder)    Insomnia    Fatigue 07/15/2011    [name], [name], [name]  12/05/2021, 10:56 AM  [facility] [place], [place], [address] Phone: [phone]   Fax:  [phone]  Name: [name] MRN: [id number] Date of Birth: 10-Jun-1956

## 2021-12-12 ENCOUNTER — Ambulatory Visit: Payer: Medicare Other | Admitting: Rehabilitative and Restorative Service Providers"

## 2021-12-12 ENCOUNTER — Other Ambulatory Visit: Payer: Self-pay

## 2021-12-12 ENCOUNTER — Encounter: Payer: Self-pay | Admitting: Rehabilitative and Restorative Service Providers"

## 2021-12-12 DIAGNOSIS — M545 Low back pain, unspecified: Secondary | ICD-10-CM | POA: Diagnosis not present

## 2021-12-12 DIAGNOSIS — R29898 Other symptoms and signs involving the musculoskeletal system: Secondary | ICD-10-CM

## 2021-12-12 DIAGNOSIS — R293 Abnormal posture: Secondary | ICD-10-CM

## 2021-12-12 NOTE — Patient Instructions (Signed)
Access Code: [email or web address] URL: [email or web address] Date: 12/12/2021 Prepared by: Gillermo Murdoch  Exercises Prone Press Up - 2 x daily - 7 x weekly - 1 sets - 10 reps - 2-3 sec hold Prone Press Up On Elbows - 2 x daily - 7 x weekly - 1 sets - 3 reps - 30 sec hold Standing Lumbar Extension - 2 x daily - 7 x weekly - 1 sets - 2-3 reps - 2-3 sec hold Prone Quadriceps Stretch with Strap - 2 x daily - 7 x weekly - 1 sets - 3 reps - 30 sec hold Hooklying Hamstring Stretch with Strap - 2 x daily - 7 x weekly - 1 sets - 3 reps - 30 sec hold Supine Piriformis Stretch with Leg Straight - 2 x daily - 7 x weekly - 1 sets - 3 reps - 30 sec hold Seated Quadratus Lumborum Stretch in Chair - 2 x daily - 7 x weekly - 1 sets - 3 reps - 30 sec hold Supine Transversus Abdominis Bracing with Pelvic Floor Contraction - 2 x daily - 7 x weekly - 1 sets - 10 reps - 10sec hold Hip Flexor Stretch at Edge of Bed - 2 x daily - 7 x weekly - 1 sets - 3 reps - 30 sec hold Gastroc Stretch on Wall - 2 x daily - 7 x weekly - 1 sets - 3 reps - 30 sec hold Soleus Stretch on Wall - 2 x daily - 7 x weekly - 1 sets - 3 reps - 30 sec hold Half Kneeling Hip Flexor Stretch with Sidebend - 1 x daily - 7 x weekly - 1 sets - 3 reps - 30 sec hold Hip Adductors and Hamstring Stretch with Strap - 1 x daily - 7 x weekly - 1 sets - 3 reps - 30 sec hold Wall Quarter Squat - 1 x daily - 7 x weekly - 1-2 sets - 10 reps - 5-10 sec hold Sit to Stand - 1 x daily - 7 x weekly - 1 sets - 10 reps - 3-5 sec hold Anti-Rotation Lateral Stepping with Press - 1 x daily - 7 x weekly - 1-2 sets - 10 reps - 2-3 sec hold Side Stepping with Resistance at Thighs - 1 x daily - 7 x weekly Hooklying Isometric Clamshell - 2 x daily - 7 x weekly - 1 sets - 10 reps - 3 sec hold Seated Hip External Rotation Stretch - 2 x daily - 7 x weekly - 1 sets - 3 reps - 30 sec hold

## 2021-12-12 NOTE — Therapy (Signed)
[place], [place], [address] Phone: [phone]   Fax:  [phone]  Physical Therapy Treatment  Patient Details  Name: [name] MRN: [id number] Date of Birth: 1956/09/07 Referring Provider ([name]): Dr [name]   Encounter Date: 12/12/2021   [name] End of Session - 12/12/21 1021     Visit Number 12    Number of Visits 18    Date for [name] Re-Evaluation 01/07/22    Progress Note Due on Visit 20    [name] Start Time 1015    [name] Stop Time 1103    [name] Time Calculation (min) 48 min    Activity Tolerance Patient tolerated treatment well             Past Medical History:  Diagnosis Date   ADD (attention deficit disorder)    adult- per psych   Adenomatous colon polyp    Cancer ([place])    basal cell skin cancer    Diverticulitis    Perforated sigmoid diverticulitis    Fall 02/2009   fx left hip and wrist   Glaucoma    LEFT eye   Heart murmur    Echo 2003 "thickened and somehow myomateous MV, mild MR"   HOH (hard of hearing)    Hydrocele 2016   Large, symptomatic left hydrocele, planned hydrocelectomy in 10/2015 with Dr. Diona Fanti   Hyperlipidemia    on meds   Insomnia    per psych   Seasonal allergies    Wears glasses     Past Surgical History:  Procedure Laterality Date   COLONOSCOPY  2017   JP-MAC-suprep(exc)-TA and benign lymphoid -recall 5 yrs   HIP SURGERY Left 2011   ORIF femur   HYDROCELE EXCISION Left 11/02/2015   Procedure: HYDROCELECTOMY ADULT;  Surgeon: Franchot Gallo, MD;  Location[facility];  Service: Urology;  Laterality: Left;   LAPAROSCOPIC SIGMOID COLECTOMY N/A 02/11/2013   Procedure: LAPAROSCOPIC SIGMOID COLECTOMY WITH MOBILIZATION SPLENIC FLEXURE;  Surgeon: Madilyn Hook, DO;  Location: [facility];  Service: General;  Laterality: N/A;   nose biopsy  2011   Dr Allyson Sabal, no cancer   POLYPECTOMY  2017   TA and benign lymphoid   WISDOM TOOTH EXTRACTION      WRIST SURGERY Left 2011    There were no vitals filed for this visit.   Subjective Assessment - 12/12/21 1023     Subjective Patient reports that he continues to improve. He is walking 18 holes of golf  and 9 holes the following day carrying his golf bag. He did OK - just some soreness. He is still doing his exercises. Still has some soreness and tightness in the Lt hip and leg.    Currently in Pain? No/denies    Pain Score 0-No pain    Pain Location Hip    Pain Orientation Left    Pain Descriptors / Indicators Sore    Pain Type Chronic pain                [facility] [name] Assessment - 12/12/21 0001       Assessment   Medical Diagnosis Lt LBP    Referring Provider ([name]) Dr [name]    Onset Date/Surgical Date 09/01/21    Hand Dominance Right    Next MD Visit PRN    Prior Therapy after Lt femur fx ~ 10 yrs ago      Palpation   Palpation comment decreasing tight Lt gluts/TFL/hamstrings;  adductors; hip flexors; piriformis; gluts; Ql; lats; lumbar and thoracic paraspinals                           [facility] Adult [name] Treatment/Exercise - 12/12/21 0001       Lumbar Exercises: Stretches   Passive Hamstring Stretch Left;3 reps;30 seconds    Passive Hamstring Stretch Limitations supine with strap; slight aduction and IR    Prone on Elbows Stretch 3 reps;30 seconds    Press Ups 10 reps    Press Ups Limitations 2-3 sec pause    Quad Stretch Left;3 reps;30 seconds    Quad Stretch Limitations prone with strap    Figure 4 Stretch 3 reps;30 seconds;With overpressure;Seated    Figure 4 Stretch Limitations sitting VC to maintain upright posture      Lumbar Exercises: Aerobic   Nustep L7 x 8 min L/UE's      Moist Heat Therapy   Number Minutes Moist Heat 10 Minutes    Moist Heat Location Hip;Knee   posterior thigh     Electrical Stimulation   Electrical Stimulation Location posterior hip and ITB/hamstring    Electrical Stimulation Action mAmp x 5 min; microcurrent x 5  min    Electrical Stimulation Parameters to tolerance    Electrical Stimulation Goals Tone;Pain      Manual Therapy   Manual therapy comments skilled palpation to assess response to DN and manual work    Joint Mobilization PA mobs Lt GT    Soft tissue mobilization deep tissue work through the Lt anterior hip in hip flexors and quads    Myofascial Release posterior hip    Passive ROM IR/ER Lt hip [name] prone hip extended, knee flexed - contract relax into ER              Trigger Point Dry Needling - 12/12/21 0001     Consent Given? Yes    Education Handout Provided Previously provided    Dry Needling Comments Lt    Electrical Stimulation Performed with Dry Needling Yes    Gluteus Minimus Response Palpable increased muscle length;Twitch response elicited    Gluteus Maximus Response Palpable increased muscle length;Twitch response elicited    Piriformis Response Palpable increased muscle length;Twitch response elicited                   [name] Education - 12/12/21 1030     Education Details HEP    Person(s) Educated Patient    Methods Explanation;Demonstration;Tactile cues;Verbal cues;Handout    Comprehension Verbalized understanding;Returned demonstration;Verbal cues required;Tactile cues required                 [name] Long Term Goals - 11/26/21 1034       [name] LONG TERM GOAL #1   Title Decrease pain in the LB and Lt LE with patient to reports no pain with functional and recreational activities    Time 6    Period Weeks    Status Achieved      [name] LONG TERM GOAL #2   Title Increase LB and LE mobility with patient to demonstrate increased tissue extensibility in hip and LE - SLR to 75 deg bilat    Time 6    Period Weeks    Status On-going    Target Date 01/07/22      [name] LONG TERM GOAL #3   Title Patient to demonstrate proper posture and alignment with sitting, standing and for transitional movements  Time 6    Period Weeks    Status On-going    Target Date  01/07/22      [name] LONG TERM GOAL #4   Title Independent in HEP (including aquatic program as indicated)    Time 6    Period Weeks    Status On-going    Target Date 01/07/22      [name] LONG TERM GOAL #5   Title Improve functional limitation score to 71    Time 6    Period Weeks    Status Achieved                   Plan - 12/12/21 1026     Clinical Impression Statement Continued functional improvement with minimal pain. Patient has some soreness from various activities. Continued tightness in hip rotators. Added sitting stretch for ER with good improvement in mobility. Patient is progressing well with rehab.    Rehab Potential Good    [name] Frequency 1x / week    [name] Duration 6 weeks    [name] Treatment/Interventions ADLs/Self Care Home Management;Aquatic Therapy;Cryotherapy;Electrical Stimulation;Moist Heat;Ultrasound;Functional mobility training;Therapeutic activities;Therapeutic exercise;Balance training;Neuromuscular re-education;Patient/family education;Manual techniques;Passive range of motion;Dry needling;Taping    [name] Next Visit Plan review HEP and myofacial ball release work; continue DN to areas of muscular tightness in Lt QL/posterior hip/thigh; core stabilization and strengthening; back care education -    [place] and Agree with Plan of Care Patient             Patient will benefit from skilled therapeutic intervention in order to improve the following deficits and impairments:     Visit Diagnosis: Acute left-sided low back pain without sciatica  Other symptoms and signs involving the musculoskeletal system  Abnormal posture     Problem List Patient Active Problem List   Diagnosis Date Noted   Erectile dysfunction 09/19/2020   Abnormality of gait 02/13/2017   PCP NOTES >>>>>>>>>>>>>>>>>>>>>>>>>> 48/54/6270   Folliculitis 35/00/9381   Heart murmur 12/23/2014   Annual physical exam 12/22/2013   Snoring 12/22/2013   Joint  pain--feet pain 12/22/2013   Diverticulitis s/p lap colectomy [id number] 02/13/2013   ADD (attention deficit disorder)    Insomnia    Fatigue 07/15/2011    [name], [name], [name]  12/12/2021, 10:48 AM  [facility] [place], [place], [address] Phone: [phone]   Fax:  [phone]  Name: [name] MRN: [id number] Date of Birth: 1956-08-29

## 2021-12-18 ENCOUNTER — Other Ambulatory Visit: Payer: Self-pay

## 2021-12-18 ENCOUNTER — Ambulatory Visit: Payer: Medicare Other | Admitting: Rehabilitative and Restorative Service Providers"

## 2021-12-18 ENCOUNTER — Other Ambulatory Visit: Payer: Self-pay | Admitting: Internal Medicine

## 2021-12-18 ENCOUNTER — Encounter: Payer: Self-pay | Admitting: Rehabilitative and Restorative Service Providers"

## 2021-12-18 DIAGNOSIS — M545 Low back pain, unspecified: Secondary | ICD-10-CM

## 2021-12-18 DIAGNOSIS — R293 Abnormal posture: Secondary | ICD-10-CM

## 2021-12-18 DIAGNOSIS — R29898 Other symptoms and signs involving the musculoskeletal system: Secondary | ICD-10-CM

## 2021-12-18 NOTE — Therapy (Signed)
[place], [place], [address] Phone: [phone]   Fax:  [phone]  Physical Therapy Treatment  Patient Details  Name: [name] MRN: [id number] Date of Birth: September 23, 1956 Referring Provider (PT): Dr [name]   Encounter Date: 12/18/2021   PT End of Session - 12/18/21 0932     Visit Number 13    Number of Visits 18    Date for PT Re-Evaluation 01/07/22    Progress Note Due on Visit 20    PT Start Time 0930    PT Stop Time 1018    PT Time Calculation (min) 48 min    Activity Tolerance Patient tolerated treatment well             Past Medical History:  Diagnosis Date   ADD (attention deficit disorder)    adult- per psych   Adenomatous colon polyp    Cancer ([place])    basal cell skin cancer    Diverticulitis    Perforated sigmoid diverticulitis    Fall 02/2009   fx left hip and wrist   Glaucoma    LEFT eye   Heart murmur    Echo 2003 "thickened and somehow myomateous MV, mild MR"   HOH (hard of hearing)    Hydrocele 2016   Large, symptomatic left hydrocele, planned hydrocelectomy in 10/2015 with Dr. Diona Fanti   Hyperlipidemia    on meds   Insomnia    per psych   Seasonal allergies    Wears glasses     Past Surgical History:  Procedure Laterality Date   COLONOSCOPY  2017   JP-MAC-suprep(exc)-TA and benign lymphoid -recall 5 yrs   HIP SURGERY Left 2011   ORIF femur   HYDROCELE EXCISION Left 11/02/2015   Procedure: HYDROCELECTOMY ADULT;  Surgeon: Franchot Gallo, MD;  Location: [facility];  Service: Urology;  Laterality: Left;   LAPAROSCOPIC SIGMOID COLECTOMY N/A 02/11/2013   Procedure: LAPAROSCOPIC SIGMOID COLECTOMY WITH MOBILIZATION SPLENIC FLEXURE;  Surgeon: Madilyn Hook, DO;  Location: [facility];  Service: General;  Laterality: N/A;   nose biopsy  2011   Dr Allyson Sabal, no cancer   POLYPECTOMY  2017   TA and benign lymphoid   WISDOM TOOTH EXTRACTION      WRIST SURGERY Left 2011    There were no vitals filed for this visit.   Subjective Assessment - 12/18/21 0933     Subjective Lt hip "just feels tight". Not painful. Just tight. Can do more things than he used to do. Still has some soreness and stiffness.    Currently in Pain? No/denies    Pain Score 0-No pain    Pain Location Hip    Pain Orientation Left    Pain Descriptors / Indicators Sore    Pain Type Chronic pain                [facility] PT Assessment - 12/18/21 0001       Assessment   Medical Diagnosis Lt LBP    Referring Provider (PT) Dr [name]    Onset Date/Surgical Date 09/01/21    Hand Dominance Right    Next MD Visit PRN    Prior Therapy after Lt femur fx ~ 10 yrs ago      Flexibility   Hamstrings tight Lt 75 deg; Rt 80 deg    Quadriceps tight Lt 110 deg; Rt 115 deg    ITB tight Lt > Rt    Piriformis tight  Lt - mild Rt      Palpation   Palpation comment decreasing tight Lt gluts/TFL/hamstrings; adductors; hip flexors; piriformis; gluts; Ql; lats; lumbar and thoracic paraspinals                           [facility] Adult PT Treatment/Exercise - 12/18/21 0001       Therapeutic Activites    Lifting deadlift from 8 inch stool 20# KB x 10 reps x 2 sets with VC for hinged hip      Lumbar Exercises: Stretches   Sports administrator Left;3 reps;30 seconds    Quad Stretch Limitations prone with strap    Figure 4 Stretch 3 reps;30 seconds;With overpressure;Seated    Figure 4 Stretch Limitations sitting      Lumbar Exercises: Aerobic   Nustep L7 x 8 min L/UE's    Other Aerobic Exercise walking laps in gym x 5; backward walking to elongate hip flexors x 20 ft x 5 reps      Lumbar Exercises: Standing   Other Standing Lumbar Exercises antirotation blue TB x 10 each side      Lumbar Exercises: Seated   Sit to Stand 10 reps      Moist Heat Therapy   Number Minutes Moist Heat 10 Minutes    Moist Heat Location Hip;Knee   posterior thigh     Electrical  Stimulation   Electrical Stimulation Location posterior hip and ITB/hamstring    Electrical Stimulation Action mAmp x 8 min    Electrical Stimulation Parameters to tolerance    Electrical Stimulation Goals Tone;Pain      Manual Therapy   Manual therapy comments skilled palpation to assess response to DN and manual work    Joint Mobilization PA mobs Lt GT    Soft tissue mobilization deep tissue work through the Lt anterior hip in hip flexors and quads    Myofascial Release posterior hip    Passive ROM IR/ER Lt hip pt prone hip extended, knee flexed - contract relax into ER              Trigger Point Dry Needling - 12/18/21 0001     Consent Given? Yes    Education Handout Provided Previously provided    Dry Needling Comments Lt    Electrical Stimulation Performed with Dry Needling Yes    E-stim with Dry Needling Details mAmp x 8 min    Gluteus Minimus Response Palpable increased muscle length;Twitch response elicited    Gluteus Medius Response Palpable increased muscle length;Twitch response elicited    Gluteus Maximus Response Palpable increased muscle length;Twitch response elicited    Piriformis Response Palpable increased muscle length;Twitch response elicited                   PT Education - 12/18/21 0949     Education Details HEP    Person(s) Educated Patient    Methods Explanation;Demonstration;Tactile cues;Verbal cues;Handout    Comprehension Verbalized understanding;Returned demonstration;Verbal cues required;Tactile cues required                 PT Long Term Goals - 11/26/21 1034       PT LONG TERM GOAL #1   Title Decrease pain in the LB and Lt LE with patient to reports no pain with functional and recreational activities    Time 6    Period Weeks    Status Achieved      PT LONG TERM GOAL #2  Title Increase LB and LE mobility with patient to demonstrate increased tissue extensibility in hip and LE - SLR to 75 deg bilat    Time 6    Period  Weeks    Status On-going    Target Date 01/07/22      PT LONG TERM GOAL #3   Title Patient to demonstrate proper posture and alignment with sitting, standing and for transitional movements    Time 6    Period Weeks    Status On-going    Target Date 01/07/22      PT LONG TERM GOAL #4   Title Independent in HEP (including aquatic program as indicated)    Time 6    Period Weeks    Status On-going    Target Date 01/07/22      PT LONG TERM GOAL #5   Title Improve functional limitation score to 71    Time 6    Period Weeks    Status Achieved                   Plan - 12/18/21 0935     Clinical Impression Statement Minimal to no pain in the LB or Lt hip. added hinged hip modified dead lift to improve core stability. Patient reports continued soreness and stiffness in the hip. He is working on exercises at home and has increased physical activity. Progressing well toward stated goals of therapy.    Rehab Potential Good    PT Frequency 1x / week    PT Duration 6 weeks    PT Treatment/Interventions ADLs/Self Care Home Management;Aquatic Therapy;Cryotherapy;Electrical Stimulation;Moist Heat;Ultrasound;Functional mobility training;Therapeutic activities;Therapeutic exercise;Balance training;Neuromuscular re-education;Patient/family education;Manual techniques;Passive range of motion;Dry needling;Taping    PT Next Visit Plan review and progress HEP and myofacial ball release work; continue DN to areas of muscular tightness in Lt QL/posterior hip/thigh; core stabilization and strengthening; back care education -    [place] and Agree with Plan of Care Patient             Patient will benefit from skilled therapeutic intervention in order to improve the following deficits and impairments:     Visit Diagnosis: Acute left-sided low back pain without sciatica  Other symptoms and signs involving the musculoskeletal system  Abnormal  posture     Problem List Patient Active Problem List   Diagnosis Date Noted   Erectile dysfunction 09/19/2020   Abnormality of gait 02/13/2017   PCP NOTES >>>>>>>>>>>>>>>>>>>>>>>>>> 21/30/8657   Folliculitis 84/69/6295   Heart murmur 12/23/2014   Annual physical exam 12/22/2013   Snoring 12/22/2013   Joint pain--feet pain 12/22/2013   Diverticulitis s/p lap colectomy [id number] 02/13/2013   ADD (attention deficit disorder)    Insomnia    Fatigue 07/15/2011    [place] Nilda Simmer, PT, MPH  12/18/2021, 10:03 AM  [facility] [place], [place], [address] Phone: [phone]   Fax:  [phone]  Name: [name] MRN: [id number] Date of Birth: [birth year]

## 2021-12-18 NOTE — Patient Instructions (Signed)
Access Code: [email or web address] URL: [email or web address] Date: 12/18/2021 Prepared by: Gillermo Murdoch  Exercises Prone Press Up - 2 x daily - 7 x weekly - 1 sets - 10 reps - 2-3 sec hold Prone Press Up On Elbows - 2 x daily - 7 x weekly - 1 sets - 3 reps - 30 sec hold Standing Lumbar Extension - 2 x daily - 7 x weekly - 1 sets - 2-3 reps - 2-3 sec hold Prone Quadriceps Stretch with Strap - 2 x daily - 7 x weekly - 1 sets - 3 reps - 30 sec hold Hooklying Hamstring Stretch with Strap - 2 x daily - 7 x weekly - 1 sets - 3 reps - 30 sec hold Supine Piriformis Stretch with Leg Straight - 2 x daily - 7 x weekly - 1 sets - 3 reps - 30 sec hold Seated Quadratus Lumborum Stretch in Chair - 2 x daily - 7 x weekly - 1 sets - 3 reps - 30 sec hold Supine Transversus Abdominis Bracing with Pelvic Floor Contraction - 2 x daily - 7 x weekly - 1 sets - 10 reps - 10sec hold Hip Flexor Stretch at Edge of Bed - 2 x daily - 7 x weekly - 1 sets - 3 reps - 30 sec hold Gastroc Stretch on Wall - 2 x daily - 7 x weekly - 1 sets - 3 reps - 30 sec hold Soleus Stretch on Wall - 2 x daily - 7 x weekly - 1 sets - 3 reps - 30 sec hold Half Kneeling Hip Flexor Stretch with Sidebend - 1 x daily - 7 x weekly - 1 sets - 3 reps - 30 sec hold Hip Adductors and Hamstring Stretch with Strap - 1 x daily - 7 x weekly - 1 sets - 3 reps - 30 sec hold Wall Quarter Squat - 1 x daily - 7 x weekly - 1-2 sets - 10 reps - 5-10 sec hold Sit to Stand - 1 x daily - 7 x weekly - 1 sets - 10 reps - 3-5 sec hold Anti-Rotation Lateral Stepping with Press - 1 x daily - 7 x weekly - 1-2 sets - 10 reps - 2-3 sec hold Side Stepping with Resistance at Thighs - 1 x daily - 7 x weekly Hooklying Isometric Clamshell - 2 x daily - 7 x weekly - 1 sets - 10 reps - 3 sec hold Seated Hip External Rotation Stretch - 2 x daily - 7 x weekly - 1 sets - 3 reps - 30 sec hold Modified Deadlift with Pelvic Contraction - 1 x daily - 7 x weekly - 1 sets - 10 reps

## 2021-12-23 ENCOUNTER — Encounter: Payer: Self-pay | Admitting: Internal Medicine

## 2021-12-23 DIAGNOSIS — Z8249 Family history of ischemic heart disease and other diseases of the circulatory system: Secondary | ICD-10-CM

## 2021-12-25 ENCOUNTER — Encounter: Payer: Self-pay | Admitting: Rehabilitative and Restorative Service Providers"

## 2021-12-25 ENCOUNTER — Ambulatory Visit: Payer: Medicare Other | Attending: Family Medicine | Admitting: Rehabilitative and Restorative Service Providers"

## 2021-12-25 ENCOUNTER — Other Ambulatory Visit: Payer: Self-pay

## 2021-12-25 DIAGNOSIS — M545 Low back pain, unspecified: Secondary | ICD-10-CM | POA: Diagnosis not present

## 2021-12-25 DIAGNOSIS — R293 Abnormal posture: Secondary | ICD-10-CM | POA: Insufficient documentation

## 2021-12-25 DIAGNOSIS — R29898 Other symptoms and signs involving the musculoskeletal system: Secondary | ICD-10-CM | POA: Diagnosis present

## 2021-12-25 NOTE — Therapy (Signed)
°[facility] °[address] °[place], [place], [address] °Phone: [phone]   Fax:  [phone] ° °Physical Therapy Treatment and Discharge Summary ° °PHYSICAL THERAPY DISCHARGE SUMMARY ° °Visits from Start of Care: 14 ° °Current functional level related to goals / functional outcomes: °See progress note for discharge status  °  °Remaining deficits: °No known deficits  °  °Education / Equipment: °HEP ° °Patient agrees to discharge. Patient goals were met. Patient is being discharged due to meeting the stated rehab goals. ° °Celyn P. Holt PT, MPH °12/25/21 10:07 AM ° ° °Patient Details  °Name: [name] °MRN: [id number] °Date of Birth: 02/23/1956 °Referring Provider (PT): Dr Evan Corey ° ° °Encounter Date: 12/25/2021 ° ° PT End of Session - 12/25/21 0932   ° ° Visit Number 14   ° Number of Visits 18   ° Date for PT Re-Evaluation 01/07/22   ° Progress Note Due on Visit 20   ° PT Start Time 0930   ° PT Stop Time 1019   ° PT Time Calculation (min) 49 min   ° Activity Tolerance Patient tolerated treatment well   ° °  °  ° °  ° ° °Past Medical History:  °Diagnosis Date  ° ADD (attention deficit disorder)   ° adult- per psych  ° Adenomatous colon polyp   ° Cancer ([facility])   ° basal cell skin cancer   ° Diverticulitis   ° Perforated sigmoid diverticulitis   ° Fall 02/2009  ° fx left hip and wrist  ° Glaucoma   ° LEFT eye  ° Heart murmur   ° Echo 2003 "thickened and somehow myomateous MV, mild MR"  ° HOH (hard of hearing)   ° Hydrocele 2016  ° Large, symptomatic left hydrocele, planned hydrocelectomy in 10/2015 with Dr. Dahlstedt  ° Hyperlipidemia   ° on meds  ° Insomnia   ° per psych  ° Seasonal allergies   ° Wears glasses   ° ° °Past Surgical History:  °Procedure Laterality Date  ° COLONOSCOPY  2017  ° JP-MAC-suprep(exc)-TA and benign lymphoid -recall 5 yrs  ° HIP SURGERY Left 2011  ° ORIF femur  ° HYDROCELE EXCISION Left 11/02/2015  ° Procedure: HYDROCELECTOMY ADULT;   Surgeon: Stephen Dahlstedt, MD;  Location: [facility];  Service: Urology;  Laterality: Left;  ° LAPAROSCOPIC SIGMOID COLECTOMY N/A 02/11/2013  ° Procedure: LAPAROSCOPIC SIGMOID COLECTOMY WITH MOBILIZATION SPLENIC FLEXURE;  Surgeon: Brian Layton, DO;  Location: [facility];  Service: General;  Laterality: N/A;  ° nose biopsy  2011  ° Dr Lupton, no cancer  ° POLYPECTOMY  2017  ° TA and benign lymphoid  ° WISDOM TOOTH EXTRACTION    ° WRIST SURGERY Left 2011  ° ° °There were no vitals filed for this visit. ° ° Subjective Assessment - 12/25/21 0933   ° ° Subjective Doing well with minimal to no pain. The Lt leg is tighter than the Rt in some motions. Doing everything he wants and needs to do.   ° Currently in Pain? No/denies   ° Pain Score 0-No pain   ° Pain Location Hip   ° Pain Orientation Left   ° °  °  ° °  ° ° ° ° ° [facility] PT Assessment - 12/25/21 0001   ° °  ° Assessment  ° Medical Diagnosis Lt LBP   ° Referring Provider (PT) Dr Evan Corey   ° Onset Date/Surgical Date 09/01/21   °   Hand Dominance Right    Next MD Visit PRN    Prior Therapy after Lt femur fx ~ 10 yrs ago      AROM   Lumbar Flexion 95%    Lumbar Extension 60%    Lumbar - Right Side Bend 90%    Lumbar - Left Side Bend 90%    Lumbar - Right Rotation 55%    Lumbar - Left Rotation 60%      Flexibility   Hamstrings tight Lt 75 deg; Rt 80 deg    Quadriceps tight Lt 110 deg; Rt 115 deg    ITB tight Lt > Rt    Piriformis tight Lt - mild Rt      Palpation   Palpation comment decreasing tight Lt gluts/TFL/hamstrings; adductors; hip flexors; piriformis; gluts; Ql; lats; lumbar and thoracic paraspinals                           [facility] Adult PT Treatment/Exercise - 12/25/21 0001       Neuro Re-ed    Neuro Re-ed Details  postural correction encouraging thoracic extension      Lumbar Exercises: Stretches   Quad Stretch Left;3 reps;30 seconds    Quad Stretch Limitations prone with strap    Other Lumbar Stretch  Exercise thoracic stretch in sitting working on trunk rotation and extension with coregeous ball      Lumbar Exercises: Aerobic   Nustep L7 x 8 min L/UE's      Lumbar Exercises: Standing   Other Standing Lumbar Exercises antirotation blue TB x 10 each side      Lumbar Exercises: Seated   Sit to Stand 10 reps      Moist Heat Therapy   Number Minutes Moist Heat 10 Minutes    Moist Heat Location Hip;Lumbar Spine      Electrical Stimulation   Electrical Stimulation Location posterior and lateral hip    Electrical Stimulation Action mAmp x 8 min    Electrical Stimulation Parameters to tolerance    Electrical Stimulation Goals Tone;Pain      Manual Therapy   Manual therapy comments skilled palpation to assess response to DN and manual work    Joint Mobilization PA mobs Lt GT    Soft tissue mobilization deep tissue work through the Lt anterior hip in hip flexors and quads    Myofascial Release posterior hip    Passive ROM IR/ER Lt hip pt prone hip extended, knee flexed - contract relax into ER              Trigger Point Dry Needling - 12/25/21 0001     Consent Given? Yes    Education Handout Provided Previously provided    Dry Needling Comments Lt    Electrical Stimulation Performed with Dry Needling Yes    E-stim with Dry Needling Details mAmp x 8 min    Gluteus Minimus Response Palpable increased muscle length;Twitch response elicited    Gluteus Medius Response Palpable increased muscle length;Twitch response elicited    Gluteus Maximus Response Palpable increased muscle length;Twitch response elicited    Tensor Fascia Lata Response Palpable increased muscle length;Twitch response elicited                   PT Education - 12/25/21 0949     Education Details HEP    Person(s) Educated Patient    Methods Explanation;Demonstration;Tactile cues;Verbal cues;Handout    Comprehension Verbalized understanding;Returned demonstration;Verbal cues required;Tactile cues  required   ° °  °  ° °  ° ° ° ° ° ° PT Long Term Goals - 12/25/21 0944   ° °  ° PT LONG TERM GOAL #1  ° Title Decrease pain in the LB and Lt LE with patient to reports no pain with functional and recreational activities   ° Time 6   ° Status Achieved   ° Target Date 11/15/21   °  ° PT LONG TERM GOAL #2  ° Title Increase LB and LE mobility with patient to demonstrate increased tissue extensibility in hip and LE - SLR to 75 deg bilat   ° Time 6   ° Period Weeks   ° Status Achieved   °  ° PT LONG TERM GOAL #3  ° Title Patient to demonstrate proper posture and alignment with sitting, standing and for transitional movements   ° Time 6   ° Period Weeks   ° Status Achieved   ° Target Date 01/07/22   °  ° PT LONG TERM GOAL #4  ° Title Independent in HEP (including aquatic program as indicated)   ° Time 6   ° Period Weeks   ° Status Achieved   ° Target Date 01/07/22   °  ° PT LONG TERM GOAL #5  ° Title Improve functional limitation score to 71   ° Time 6   ° Period Weeks   ° Status Achieved   ° Target Date 11/15/21   ° °  °  ° °  ° ° ° ° ° ° ° ° Plan - 12/25/21 0934   ° ° Clinical Impression Statement No significant pain or discomfort. Patient has returned to all normal functional and recreational activities without difficulty. Some persistent tightness in the Lt LE. Patient is continuing with HEP. He has minimal tightness to palpation. Goals of therapy have been accomplished. Patient will continue with independent HEP and callwith any questions or problems.   ° Rehab Potential Good   ° PT Frequency 1x / week   ° PT Duration 6 weeks   ° PT Treatment/Interventions ADLs/Self Care Home Management;Aquatic Therapy;Cryotherapy;Electrical Stimulation;Moist Heat;Ultrasound;Functional mobility training;Therapeutic activities;Therapeutic exercise;Balance training;Neuromuscular re-education;Patient/family education;Manual techniques;Passive range of motion;Dry needling;Taping   ° PT Next Visit Plan review and progress HEP and myofacial  ball release work; continue DN to areas of muscular tightness in Lt QL/posterior hip/thigh; core stabilization and strengthening; back care education -   ° PT Home Exercise Plan [email or web address]   ° Consulted and Agree with Plan of Care Patient   ° °  °  ° °  ° ° °Patient will benefit from skilled therapeutic intervention in order to improve the following deficits and impairments:    ° °Visit Diagnosis: °Acute left-sided low back pain without sciatica ° °Other symptoms and signs involving the musculoskeletal system ° °Abnormal posture ° ° ° ° °Problem List °Patient Active Problem List  ° Diagnosis Date Noted  ° Erectile dysfunction 09/19/2020  ° Abnormality of gait 02/13/2017  ° PCP NOTES >>>>>>>>>>>>>>>>>>>>>>>>>> 01/03/2016  ° Folliculitis 07/04/2015  ° Heart murmur 12/23/2014  ° Annual physical exam 12/22/2013  ° Snoring 12/22/2013  ° Joint pain--feet pain 12/22/2013  ° Diverticulitis s/p lap colectomy Mar2014 02/13/2013  ° ADD (attention deficit disorder)   ° Insomnia   ° Fatigue 07/15/2011  ° ° °Celyn P Holt, PT, MPH  °12/25/2021, 10:18 am ° °[place] °[facility] °[address] °[place], [place], [address] °Phone: [phone]   Fax:    [phone]  Name: [name] MRN: [id number] Date of Birth: 1956/02/12

## 2021-12-25 NOTE — Patient Instructions (Signed)
Access Code: [email or web address] URL: [email or web address] Date: 12/25/2021 Prepared by: Gillermo Murdoch  Exercises Prone Press Up - 2 x daily - 7 x weekly - 1 sets - 10 reps - 2-3 sec hold Prone Press Up On Elbows - 2 x daily - 7 x weekly - 1 sets - 3 reps - 30 sec hold Standing Lumbar Extension - 2 x daily - 7 x weekly - 1 sets - 2-3 reps - 2-3 sec hold Prone Quadriceps Stretch with Strap - 2 x daily - 7 x weekly - 1 sets - 3 reps - 30 sec hold Hooklying Hamstring Stretch with Strap - 2 x daily - 7 x weekly - 1 sets - 3 reps - 30 sec hold Supine Piriformis Stretch with Leg Straight - 2 x daily - 7 x weekly - 1 sets - 3 reps - 30 sec hold Seated Quadratus Lumborum Stretch in Chair - 2 x daily - 7 x weekly - 1 sets - 3 reps - 30 sec hold Supine Transversus Abdominis Bracing with Pelvic Floor Contraction - 2 x daily - 7 x weekly - 1 sets - 10 reps - 10sec hold Hip Flexor Stretch at Edge of Bed - 2 x daily - 7 x weekly - 1 sets - 3 reps - 30 sec hold Gastroc Stretch on Wall - 2 x daily - 7 x weekly - 1 sets - 3 reps - 30 sec hold Soleus Stretch on Wall - 2 x daily - 7 x weekly - 1 sets - 3 reps - 30 sec hold Half Kneeling Hip Flexor Stretch with Sidebend - 1 x daily - 7 x weekly - 1 sets - 3 reps - 30 sec hold Hip Adductors and Hamstring Stretch with Strap - 1 x daily - 7 x weekly - 1 sets - 3 reps - 30 sec hold Wall Quarter Squat - 1 x daily - 7 x weekly - 1-2 sets - 10 reps - 5-10 sec hold Sit to Stand - 1 x daily - 7 x weekly - 1 sets - 10 reps - 3-5 sec hold Anti-Rotation Lateral Stepping with Press - 1 x daily - 7 x weekly - 1-2 sets - 10 reps - 2-3 sec hold Side Stepping with Resistance at Thighs - 1 x daily - 7 x weekly Hooklying Isometric Clamshell - 2 x daily - 7 x weekly - 1 sets - 10 reps - 3 sec hold Seated Hip External Rotation Stretch - 2 x daily - 7 x weekly - 1 sets - 3 reps - 30 sec hold Modified Deadlift with Pelvic Contraction - 1 x daily - 7 x weekly - 1 sets - 10  reps Seated Thoracic Extension Arms Overhead - 2 x daily - 7 x weekly - 1 sets - 3 reps - 30 sec hold Seated Thoracic Extension and Rotation with Reach - 2 x daily - 7 x weekly - 1 sets - 3 reps - 10 sec hold

## 2022-01-01 ENCOUNTER — Telehealth: Payer: Self-pay | Admitting: Internal Medicine

## 2022-01-01 NOTE — Telephone Encounter (Signed)
Medication: amphetamine-dextroamphetamine (ADDERALL XR) 30 MG 24 hr capsule   Has the patient contacted their pharmacy? No.  Preferred Pharmacy (with phone number or street name):   [facility] [place] [place] [address]  [place], [place] [address]  Phone:  [phone]  Fax:  [phone]    Agent: Please be advised that RX refills may take up to 3 business days. We ask that you follow-up with your pharmacy.

## 2022-01-01 NOTE — Telephone Encounter (Signed)
Rx should already be on file for this month. Mychart message sent to Pt.

## 2022-01-04 ENCOUNTER — Ambulatory Visit (HOSPITAL_BASED_OUTPATIENT_CLINIC_OR_DEPARTMENT_OTHER)
Admission: RE | Admit: 2022-01-04 | Discharge: 2022-01-04 | Disposition: A | Discharge status: Home / Self Care | Payer: Medicare Other | Source: Ambulatory Visit | Attending: Internal Medicine | Admitting: Internal Medicine

## 2022-01-04 ENCOUNTER — Other Ambulatory Visit: Payer: Self-pay

## 2022-01-04 DIAGNOSIS — Z8249 Family history of ischemic heart disease and other diseases of the circulatory system: Secondary | ICD-10-CM | POA: Insufficient documentation

## 2022-01-28 ENCOUNTER — Other Ambulatory Visit: Payer: Self-pay | Admitting: Internal Medicine

## 2022-02-07 ENCOUNTER — Other Ambulatory Visit: Payer: Self-pay | Admitting: Internal Medicine

## 2022-02-18 ENCOUNTER — Telehealth: Payer: Self-pay | Admitting: Internal Medicine

## 2022-02-18 MED ORDER — SILDENAFIL CITRATE 20 MG PO TABS: ORAL_TABLET | ORAL | 3 refills | Status: DC

## 2022-02-18 MED ORDER — AMPHETAMINE-DEXTROAMPHET ER 30 MG PO CP24: 30.0000 mg | ORAL_CAPSULE | ORAL | 0 refills | Status: DC

## 2022-02-18 MED ORDER — AMPHETAMINE-DEXTROAMPHETAMINE 10 MG PO TABS: 10.0000 mg | ORAL_TABLET | Freq: Every day | ORAL | 0 refills | Status: DC | PRN

## 2022-02-18 NOTE — Telephone Encounter (Signed)
Medication: sildenafil (REVATIO) 20 MG tablet ? ?amphetamine-dextroamphetamine (ADDERALL XR) 30 MG 24 hr capsule ? ?Has the patient contacted their pharmacy? Yes.   ? ?Preferred Pharmacy: [facility] [place]  ?[place], [place] [address]  ?Phone:  [phone]  Fax:  [phone] ? ? ? ?

## 2022-02-18 NOTE — Telephone Encounter (Signed)
Requesting: Adderall '10mg'$  and XR '30mg'$  ?Contract: 06/27/21 ?UDS: 06/27/21 ?Last Visit: 07/31/21 ?Next Visit: 06/27/22 ?Last Refill: 11/22/21 #30 and 0RF ? ?Please Advise ? ?

## 2022-02-18 NOTE — Telephone Encounter (Signed)
PDMP okay, Rx sent 

## 2022-03-18 ENCOUNTER — Telehealth: Payer: Self-pay | Admitting: Internal Medicine

## 2022-03-18 MED ORDER — AMPHETAMINE-DEXTROAMPHET ER 30 MG PO CP24: 30.0000 mg | ORAL_CAPSULE | ORAL | 0 refills | Status: DC

## 2022-03-18 MED ORDER — AMPHETAMINE-DEXTROAMPHETAMINE 10 MG PO TABS: 10.0000 mg | ORAL_TABLET | Freq: Every day | ORAL | 0 refills | Status: DC | PRN

## 2022-03-18 NOTE — Telephone Encounter (Signed)
Medication: amphetamine-dextroamphetamine (ADDERALL XR) 30 MG 24 hr capsule ? ?Has the patient contacted their pharmacy? No. ? ? ?Preferred Pharmacy: [facility] [place]  ?[place], [place] [address]  ?Phone:  [phone]  Fax:  [phone]  ? ?

## 2022-03-18 NOTE — Telephone Encounter (Signed)
Requesting: Adderall XR '30mg'$   ?Contract:06/27/21 ?UDS: 06/27/21 ?Last Visit: 07/31/21 ?Next Visit: 06/27/22 ?Last Refill: 02/18/22 #30 and 0RF ? ?Please Advise ? ?

## 2022-03-18 NOTE — Telephone Encounter (Signed)
PDMP okay, Rx sent 

## 2022-03-21 ENCOUNTER — Ambulatory Visit (INDEPENDENT_AMBULATORY_CARE_PROVIDER_SITE_OTHER): Payer: Medicare Other

## 2022-03-21 VITALS — Ht 73.0 in | Wt 180.0 lb

## 2022-03-21 DIAGNOSIS — Z0000 Encounter for general adult medical examination without abnormal findings: Secondary | ICD-10-CM

## 2022-03-21 NOTE — Patient Instructions (Addendum)
?[name] , ?Thank you for taking time to come for your Medicare Wellness Visit. I appreciate your ongoing commitment to your health goals. Please review the following plan we discussed and let me know if I can assist you in the future.  ? ?These are the goals we discussed: ? Goals   ? ?   Travel (pt-stated)   ?   I would like to travel. ?  ? ?  ?  ?This is a list of the screening recommended for you and due dates:  ?Health Maintenance  ?Topic Date Due  ? Pneumonia Vaccine (2 - PPSV23 if available, else PCV20) 03/22/2023*  ? Flu Shot  06/18/2022  ? Colon Cancer Screening  09/20/2026  ? Tetanus Vaccine  10/11/2029  ? COVID-19 Vaccine  Completed  ? Hepatitis C Screening: USPSTF Recommendation to screen - Ages 55-79 yo.  Completed  ? Zoster (Shingles) Vaccine  Completed  ? HPV Vaccine  Aged Out  ?*Topic was postponed. The date shown is not the original due date.  ?  ?Advanced directives: Yes Patient will submit copy ? ?Conditions/risks identified: None ? ?Next appointment: Follow up in one year for your annual wellness visit.  ? ?Preventive Care 41 Years and Older, Male ?Preventive care refers to lifestyle choices and visits with your health care provider that can promote health and wellness. ?What does preventive care include? ?A yearly physical exam. This is also called an annual well check. ?Dental exams once or twice a year. ?Routine eye exams. Ask your health care provider how often you should have your eyes checked. ?Personal lifestyle choices, including: ?Daily care of your teeth and gums. ?Regular physical activity. ?Eating a healthy diet. ?Avoiding tobacco and drug use. ?Limiting alcohol use. ?Practicing safe sex. ?Taking low doses of aspirin every day. ?Taking vitamin and mineral supplements as recommended by your health care provider. ?What happens during an annual well check? ?The services and screenings done by your health care provider during your annual well check will depend on your age, overall  health, lifestyle risk factors, and family history of disease. ?Counseling  ?Your health care provider may ask you questions about your: ?Alcohol use. ?Tobacco use. ?Drug use. ?Emotional well-being. ?Home and relationship well-being. ?Sexual activity. ?Eating habits. ?History of falls. ?Memory and ability to understand (cognition). ?Work and work Statistician. ?Screening  ?You may have the following tests or measurements: ?Height, weight, and BMI. ?Blood pressure. ?Lipid and cholesterol levels. These may be checked every 5 years, or more frequently if you are over 38 years old. ?Skin check. ?Lung cancer screening. You may have this screening every year starting at age 54 if you have a 30-pack-year history of smoking and currently smoke or have quit within the past 15 years. ?Fecal occult blood test (FOBT) of the stool. You may have this test every year starting at age 40. ?Flexible sigmoidoscopy or colonoscopy. You may have a sigmoidoscopy every 5 years or a colonoscopy every 10 years starting at age 11. ?Prostate cancer screening. Recommendations will vary depending on your family history and other risks. ?Hepatitis C blood test. ?Hepatitis B blood test. ?Sexually transmitted disease (STD) testing. ?Diabetes screening. This is done by checking your blood sugar (glucose) after you have not eaten for a while (fasting). You may have this done every 1-3 years. ?Abdominal aortic aneurysm (AAA) screening. You may need this if you are a current or former smoker. ?Osteoporosis. You may be screened starting at age 31 if you are at high risk. ?Talk  with your health care provider about your test results, treatment options, and if necessary, the need for more tests. ?Vaccines  ?Your health care provider may recommend certain vaccines, such as: ?Influenza vaccine. This is recommended every year. ?Tetanus, diphtheria, and acellular pertussis (Tdap, Td) vaccine. You may need a Td booster every 10 years. ?Zoster vaccine. You may  need this after age 54. ?Pneumococcal 13-valent conjugate (PCV13) vaccine. One dose is recommended after age 45. ?Pneumococcal polysaccharide (PPSV23) vaccine. One dose is recommended after age 89. ?Talk to your health care provider about which screenings and vaccines you need and how often you need them. ?This information is not intended to replace advice given to you by your health care provider. Make sure you discuss any questions you have with your health care provider. ?Document Released: 12/01/2015 Document Revised: 07/24/2016 Document Reviewed: 09/05/2015 ?Elsevier Interactive Patient Education ? 2017 [place]. ? ?Fall Prevention in the Home ?Falls can cause injuries. They can happen to people of all ages. There are many things you can do to make your home safe and to help prevent falls. ?What can I do on the outside of my home? ?Regularly fix the edges of walkways and driveways and fix any cracks. ?Remove anything that might make you trip as you walk through a door, such as a raised step or threshold. ?Trim any bushes or trees on the path to your home. ?Use bright outdoor lighting. ?Clear any walking paths of anything that might make someone trip, such as rocks or tools. ?Regularly check to see if handrails are loose or broken. Make sure that both sides of any steps have handrails. ?Any raised decks and porches should have guardrails on the edges. ?Have any leaves, snow, or ice cleared regularly. ?Use sand or salt on walking paths during winter. ?Clean up any spills in your garage right away. This includes oil or grease spills. ?What can I do in the bathroom? ?Use night lights. ?Install grab bars by the toilet and in the tub and shower. Do not use towel bars as grab bars. ?Use non-skid mats or decals in the tub or shower. ?If you need to sit down in the shower, use a plastic, non-slip stool. ?Keep the floor dry. Clean up any water that spills on the floor as soon as it happens. ?Remove soap buildup in  the tub or shower regularly. ?Attach bath mats securely with double-sided non-slip rug tape. ?Do not have throw rugs and other things on the floor that can make you trip. ?What can I do in the bedroom? ?Use night lights. ?Make sure that you have a light by your bed that is easy to reach. ?Do not use any sheets or blankets that are too big for your bed. They should not hang down onto the floor. ?Have a firm chair that has side arms. You can use this for support while you get dressed. ?Do not have throw rugs and other things on the floor that can make you trip. ?What can I do in the kitchen? ?Clean up any spills right away. ?Avoid walking on wet floors. ?Keep items that you use a lot in easy-to-reach places. ?If you need to reach something above you, use a strong step stool that has a grab bar. ?Keep electrical cords out of the way. ?Do not use floor polish or wax that makes floors slippery. If you must use wax, use non-skid floor wax. ?Do not have throw rugs and other things on the floor that can make you  trip. ?What can I do with my stairs? ?Do not leave any items on the stairs. ?Make sure that there are handrails on both sides of the stairs and use them. Fix handrails that are broken or loose. Make sure that handrails are as long as the stairways. ?Check any carpeting to make sure that it is firmly attached to the stairs. Fix any carpet that is loose or worn. ?Avoid having throw rugs at the top or bottom of the stairs. If you do have throw rugs, attach them to the floor with carpet tape. ?Make sure that you have a light switch at the top of the stairs and the bottom of the stairs. If you do not have them, ask someone to add them for you. ?What else can I do to help prevent falls? ?Wear shoes that: ?Do not have high heels. ?Have rubber bottoms. ?Are comfortable and fit you well. ?Are closed at the toe. Do not wear sandals. ?If you use a stepladder: ?Make sure that it is fully opened. Do not climb a closed  stepladder. ?Make sure that both sides of the stepladder are locked into place. ?Ask someone to hold it for you, if possible. ?Clearly mark and make sure that you can see: ?Any grab bars or handrails. ?First and last ste

## 2022-03-21 NOTE — Progress Notes (Addendum)
? ?Subjective:  ? [name] is a 66 y.o. male who presents for Medicare Annual/Subsequent preventive examination. ? ?Review of Systems    ?Virtual Visit via Telephone Note ? ?I connected with  [name] on 03/21/22 at  8:45 AM EDT by telephone and verified that I am speaking with the correct person using two identifiers. ? ?Location: ?Patient: Home ?Provider: Office ?Persons participating in the virtual visit: patient/Nurse Health Advisor ?  ?I discussed the limitations, risks, security and privacy concerns of performing an evaluation and management service by telephone and the availability of in person appointments. The patient expressed understanding and agreed to proceed. ? ?Interactive audio and video telecommunications were attempted between this nurse and patient, however failed, due to patient having technical difficulties OR patient did not have access to video capability.  We continued and completed visit with audio only. ? ?Some vital signs may be absent or patient reported.  ? ?Darryl Peaches, LPN  ?Cardiac Risk Factors include: advanced age (>23mn, >>59women);male gender ? ?   ?Objective:  ?  ?Today's Vitals  ? 03/21/22 0845  ?Weight: 180 lb (81.6 kg)  ?Height: _0  (1.854 m)  ? ?Body mass index is 23.75 kg/m?. ? ? ?  03/21/2022  ?  8:53 AM 10/04/2021  ? 10:11 AM 05/28/2016  ?  8:10 AM 05/14/2016  ?  8:05 AM 11/02/2015  ?  6:16 AM 10/27/2015  ?  2:43 PM 02/11/2013  ?  1:31 PM  ?Advanced Directives  ?Does Patient Have a Medical Advance Directive? Yes Yes Yes Yes No No Patient has advance directive, copy not in chart  ?Type of [place] Living will;Healthcare Power of Attorney Living will;Healthcare Power of [place]  ?Does patient want to make changes to medical advance directive? No - Patient declined        ?Copy of [place]? No - copy  requested No - copy requested No - copy requested      ?Would patient like information on creating a medical advance directive?     No - patient declined information No - patient declined information   ?Pre-existing out of facility DNR order (yellow form or pink MOST form)       No  ? ? ?Current Medications (verified) ?Outpatient Encounter Medications as of 03/21/2022  ?Medication Sig  ? amphetamine-dextroamphetamine (ADDERALL XR) 30 MG 24 hr capsule Take 1 capsule (30 mg total) by mouth every morning.  ? amphetamine-dextroamphetamine (ADDERALL) 10 MG tablet Take 1 tablet (10 mg total) by mouth daily as needed.  ? atorvastatin (LIPITOR) 20 MG tablet Take 1 tablet (20 mg total) by mouth at bedtime.  ? azelastine (ASTELIN) 0.1 % nasal spray USE 2 SPRAYS IN EACH NOSTRIL AT BEDTIME AS NEEDED FOR RHINITIS OR ALLERGIES  ? clindamycin (CLINDAGEL) 1 % gel Apply topically 2 (two) times daily. (Patient taking differently: Apply 1 application topically daily as needed.)  ? COVID-19 mRNA bivalent vaccine, Pfizer, (PFIZER COVID-19 VAC BIVALENT) injection Inject into the muscle.  ? diclofenac sodium (VOLTAREN) 1 % GEL Apply 4 g topically 4 (four) times daily as needed.  ? influenza vaccine adjuvanted (FLUAD) 0.5 ML injection Inject into the muscle.  ? Multiple Vitamins-Minerals (CENTRUM ULTRA MENS) TABS Take 1 tablet by mouth 3 (three) times a week. -Monday, Wednesday, Friday  ? sildenafil (REVATIO) 20 MG tablet TAKE 3 TO 4  TABLETS BY MOUTH AT BEDTIME AS NEEDED  ? traZODone (DESYREL) 100 MG tablet TAKE 1/2 TO 1 TABLET(50 TO 100 MG) BY MOUTH AT BEDTIME AS NEEDED FOR SLEEP  ? ?No facility-administered encounter medications on file as of 03/21/2022.  ? ? ?Allergies (verified) ?Patient has no known allergies.  ? ?History: ?Past Medical History:  ?Diagnosis Date  ? ADD (attention deficit disorder)   ? adult- per psych  ? Adenomatous colon polyp   ? Cancer [facility])   ? basal cell skin cancer   ? Diverticulitis   ? Perforated sigmoid  diverticulitis   ? Fall 02/2009  ? fx left hip and wrist  ? Glaucoma   ? LEFT eye  ? Heart murmur   ? Echo 2003 "thickened and somehow myomateous MV, mild MR"  ? HOH (hard of hearing)   ? Hydrocele 2016  ? Large, symptomatic left hydrocele, planned hydrocelectomy in 10/2015 with Dr. Diona Fanti  ? Hyperlipidemia   ? on meds  ? Insomnia   ? per psych  ? Seasonal allergies   ? Wears glasses   ? ?Past Surgical History:  ?Procedure Laterality Date  ? COLONOSCOPY  2017  ? JP-MAC-suprep(exc)-TA and benign lymphoid -recall 5 yrs  ? HIP SURGERY Left 2011  ? ORIF femur  ? HYDROCELE EXCISION Left 11/02/2015  ? Procedure: HYDROCELECTOMY ADULT;  Surgeon: Franchot Gallo, MD;  Location: [facility];  Service: Urology;  Laterality: Left;  ? LAPAROSCOPIC SIGMOID COLECTOMY N/A 02/11/2013  ? Procedure: LAPAROSCOPIC SIGMOID COLECTOMY WITH MOBILIZATION SPLENIC FLEXURE;  Surgeon: Madilyn Hook, DO;  Location: [facility];  Service: General;  Laterality: N/A;  ? nose biopsy  2011  ? Dr Allyson Sabal, no cancer  ? POLYPECTOMY  2017  ? TA and benign lymphoid  ? WISDOM TOOTH EXTRACTION    ? WRIST SURGERY Left 2011  ? ?Family History  ?Problem Relation Age of Onset  ? Breast cancer Mother   ? Heart failure Mother   ? Lung cancer Father   ?     smoker  ? Diabetes Neg Hx   ? Hypertension Neg Hx   ? Colon cancer Neg Hx   ? Prostate cancer Neg Hx   ? Heart attack Neg Hx   ? Colon polyps Neg Hx   ? Esophageal cancer Neg Hx   ? Rectal cancer Neg Hx   ? Stomach cancer Neg Hx   ? ?Social History  ? ?Socioeconomic History  ? Marital status: Married  ?  Spouse name: [name]  ? Number of children: 3  ? Years of education: Not on file  ? Highest education level: Not on file  ?Occupational History  ? Occupation: Science writer, [employer]  ? Occupation: Retired, 06-2020  ?Tobacco Use  ? Smoking status: Never  ? Smokeless tobacco: Never  ?Vaping Use  ? Vaping Use: Never used  ?Substance and Sexual Activity  ? Alcohol use: Not Currently  ?  Alcohol/week: 4.0  standard drinks  ?  Types: 4 Standard drinks or equivalent per week  ?  Comment: MODERATION  ? Drug use: No  ? Sexual activity: Not on file  ?Other Topics Concern  ? Not on file  ?Social History Narrative  ? 3 adult children, 3 g-children  ? Household-- pt and wife  ?   ? ?Social Determinants of Health  ? ?Financial Resource Strain: Low Risk   ? Difficulty of Paying Living Expenses: Not hard at all  ?Food Insecurity: No Food Insecurity  ? Worried About Running  Out of Food in the Last Year: Never true  ? Ran Out of Food in the Last Year: Never true  ?Transportation Needs: No Transportation Needs  ? Lack of Transportation (Medical): No  ? Lack of Transportation (Non-Medical): No  ?Physical Activity: Sufficiently Active  ? Days of Exercise per Week: 2 days  ? Minutes of Exercise per Session: 150+ min  ?Stress: No Stress Concern Present  ? Feeling of Stress : Not at all  ?Social Connections: Socially Integrated  ? Frequency of Communication with Friends and Family: More than three times a week  ? Frequency of Social Gatherings with Friends and Family: More than three times a week  ? Attends Religious Services: More than 4 times per year  ? Active Member of Clubs or Organizations: Yes  ? Attends Archivist Meetings: More than 4 times per year  ? Marital Status: Married  ? ? ?Tobacco Counseling ?Counseling given: Not Answered ? ? ?Clinical Intake: ? ? ?Diabetic?  No ? ? ? ?Activities of Daily Living ? ?  03/21/2022  ?  8:51 AM  ?In your present state of health, do you have any difficulty performing the following activities:  ?Hearing? 0  ?Vision? 0  ?Difficulty concentrating or making decisions? 0  ?Walking or climbing stairs? 0  ?Dressing or bathing? 0  ?Doing errands, shopping? 0  ?Preparing Food and eating ? N  ?Using the Toilet? N  ?In the past six months, have you accidently leaked urine? N  ?Do you have problems with loss of bowel control? N  ?Managing your Medications? N  ?Managing your Finances? N   ?Housekeeping or managing your Housekeeping? N  ? ? ?Patient Care Team: ?Colon Branch, MD as PCP - General ?Clance, Armando Reichert, MD as Consulting Physician (Pulmonary Disease) ?Franchot Gallo, MD as Consulting Physician ([place]

## 2022-04-13 IMAGING — CT CT CARDIAC CORONARY ARTERY CALCIUM SCORE
2 of 3 series · 15 of 20 positions shown, 17 images · non-contrast
Comparison: None.
COMPARISON: None.

Addendum:
EXAM:
OVER-READ INTERPRETATION  CT CHEST

The following report is an over-read performed by radiologist Dr.
Jalyn Bettis [[email or web address]] on 01/04/2022. This
over-read does not include interpretation of cardiac or coronary
anatomy or pathology. The coronary calcium score interpretation by
the cardiologist is attached.
CLINICAL DATA: Cardiovascular Disease Risk stratification
Coronary Calcium Score
TECHNIQUE: A gated, non-contrast computed tomography scan of the heart was
performed using 3mm slice thickness. Axial images were analyzed on a
dedicated workstation. Calcium scoring of the coronary arteries was
performed using the Agatston method.

[Series 2: cascseq 3.0 b35f (id) · axial · 0.43mm/px · z∈[-285,-174]mm · 7 of 51 slices shown]
[im 7/51  vessel]
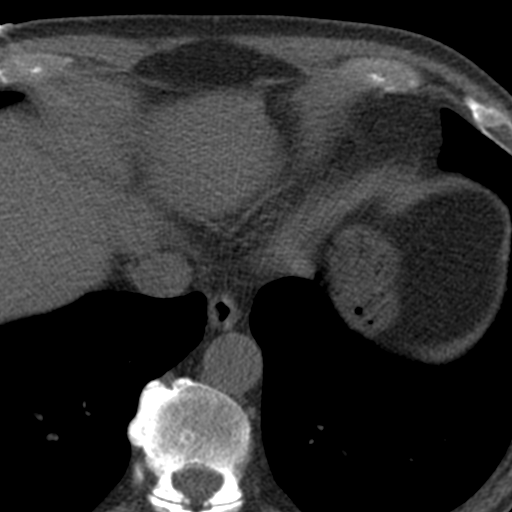
[im 13/51  vessel]
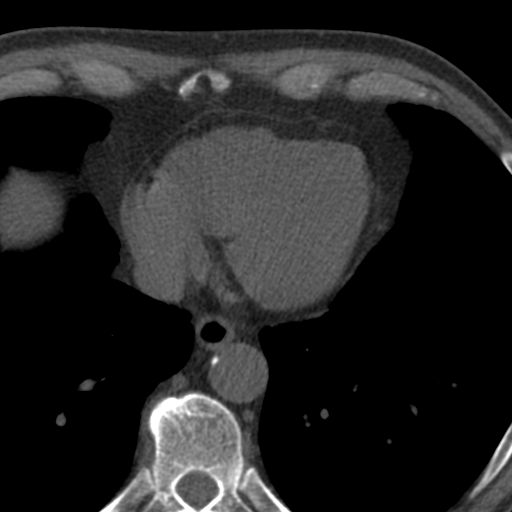
[im 19/51  vessel]
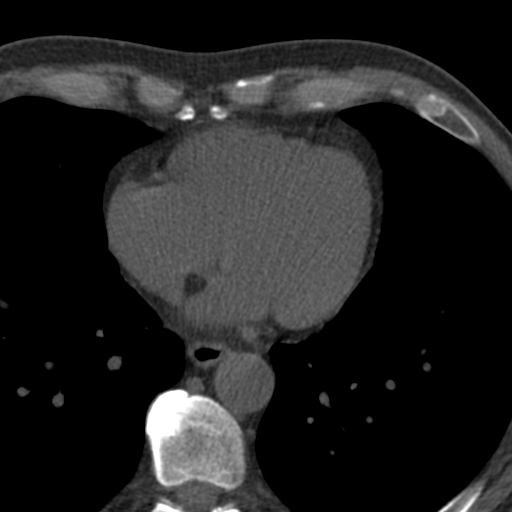
[im 26/51  vessel]
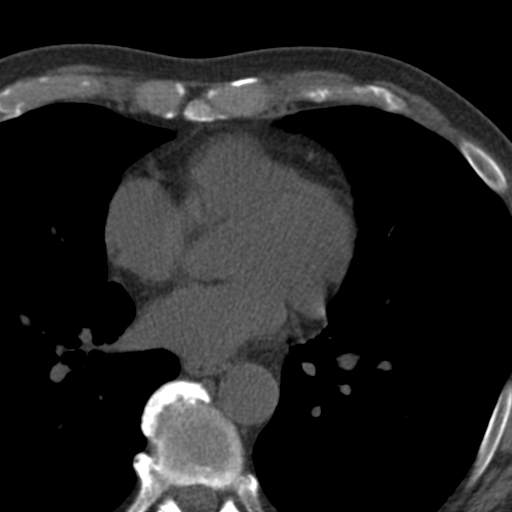
[im 32/51  vessel]
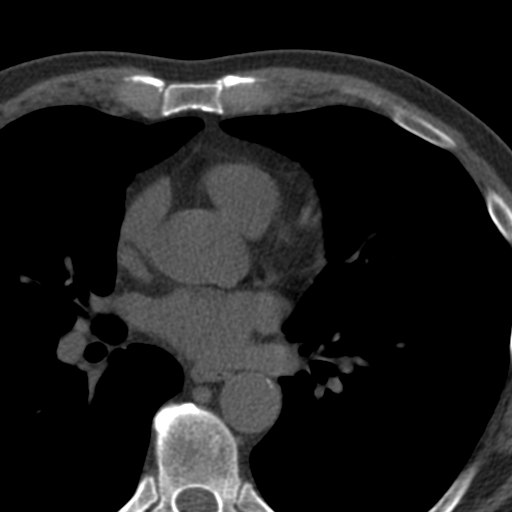
[im 38/51  vessel]
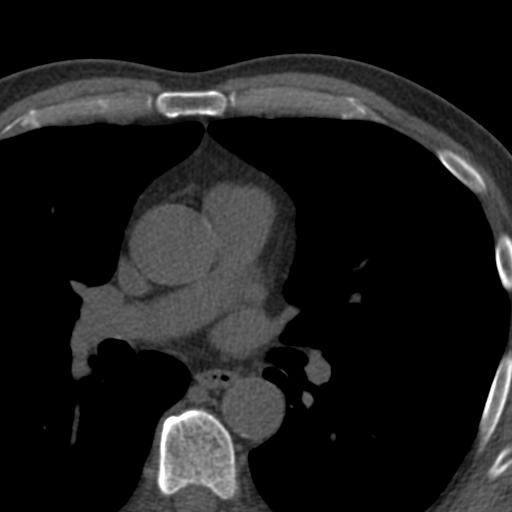
[im 44/51  vessel]
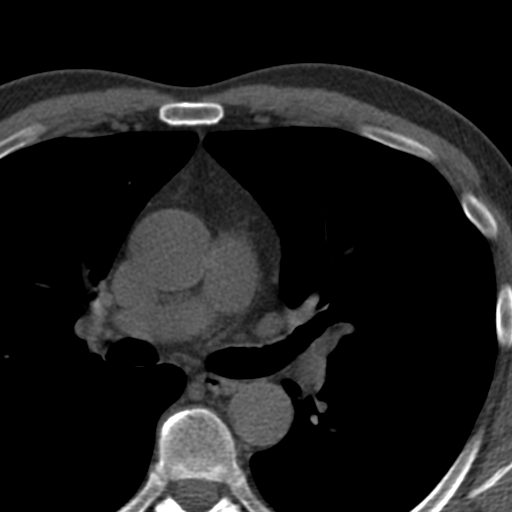

[Series 3: full fov st · axial · 0.62mm/px · z∈[-288,-171]mm · 8 of 51 slices shown, 10 images]
[im 6/51  vessel]
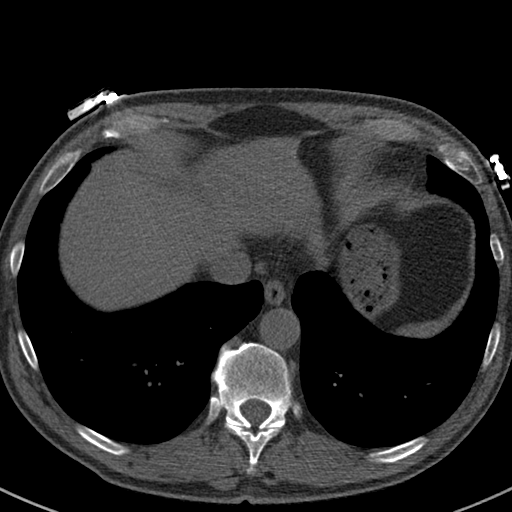
[im 6/51  lung]
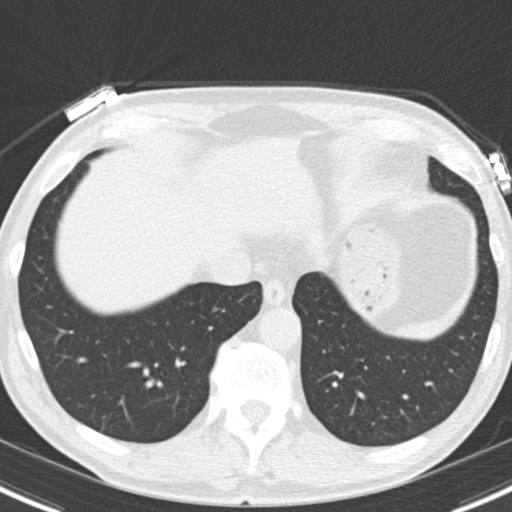
[im 12/51  vessel]
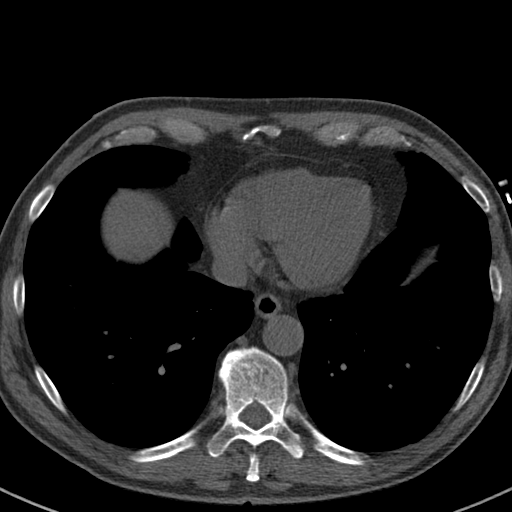
[im 17/51  vessel]
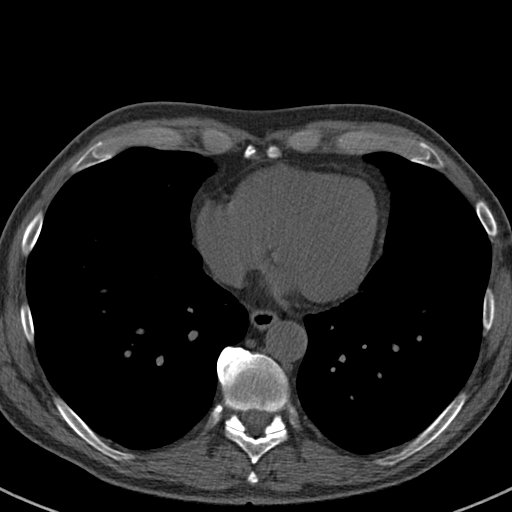
[im 23/51  vessel]
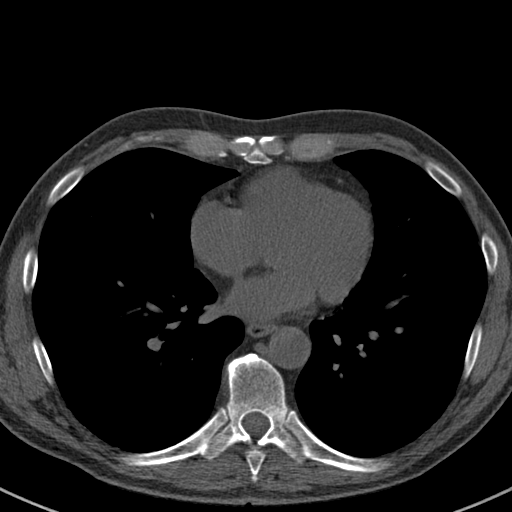
[im 28/51  vessel]
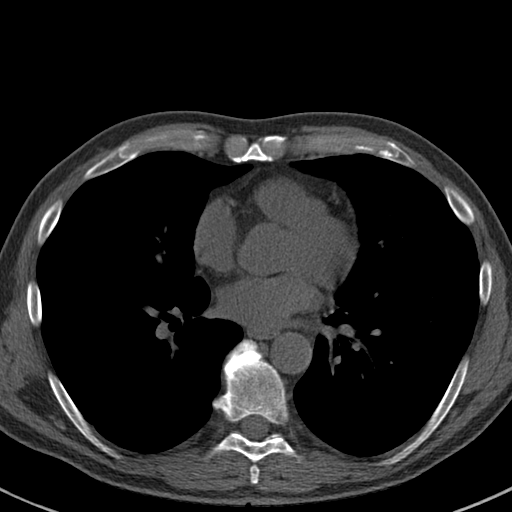
[im 28/51  lung]
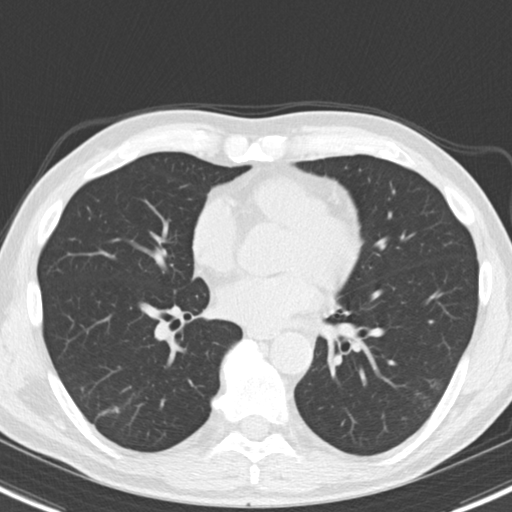
[im 34/51  vessel]
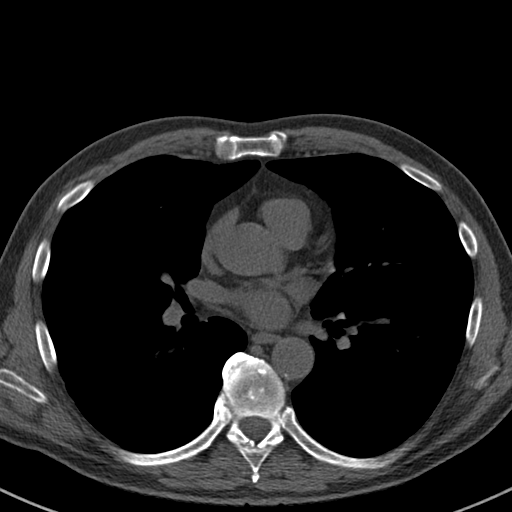
[im 39/51  vessel]
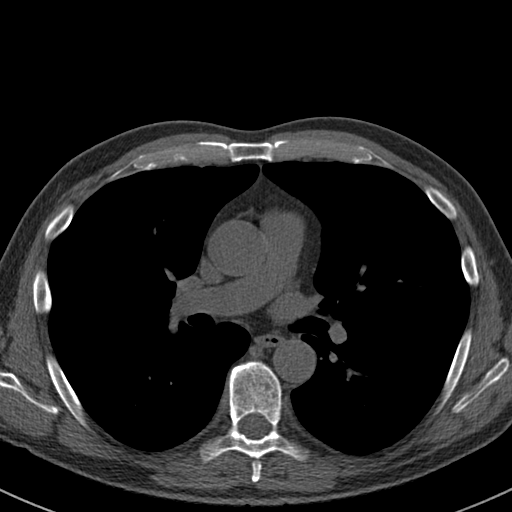
[im 45/51  vessel]
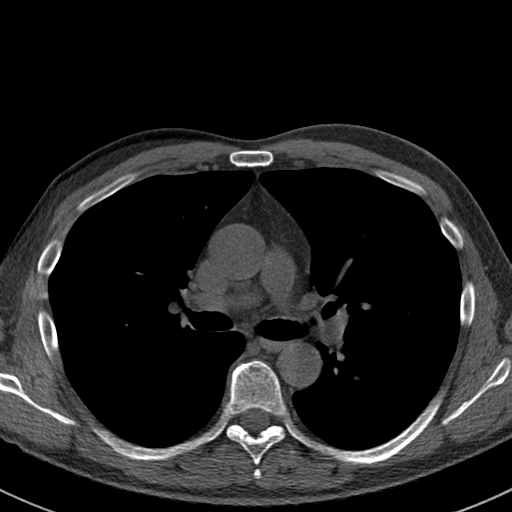

[15 of 20 positions shown; findings below may reference images not displayed]

FINDINGS: Vascular: Mild atherosclerosis of the descending thoracic aorta.

Mediastinum/Nodes: Visualized mediastinum and hilar regions
demonstrate no lymphadenopathy or masses.

Lungs/Pleura: Visualized lungs show no evidence of pulmonary edema,
consolidation, pneumothorax, nodule or pleural fluid.

Upper Abdomen: No acute abnormality.

Musculoskeletal: No chest wall mass or suspicious bone lesions
identified.
IMPRESSION: Mild atherosclerosis of the thoracic aorta.
FINDINGS: Coronary Calcium Score:

Left main: 0

Left anterior descending artery: 0

Left circumflex artery: 0

Right coronary artery: 0

Total: 0

Percentile: NA

Pericardium: Normal.

Non-cardiac: See separate report from [REDACTED].
IMPRESSION: Coronary calcium score of 0.



If CAC=0, it is reasonable to withhold statin therapy and reassess
in 5 to 10 years, as long as higher risk conditions are absent
(diabetes mellitus, family history of premature CHD in first degree
relatives (males <55 years; females <65 years), cigarette smoking,
or LDL >=190 mg/dL).

If CAC is 1 to 99, it is reasonable to initiate statin therapy for
patients >=55 years of age.

If CAC is >=100 or >=75th percentile, it is reasonable to initiate
statin therapy at any age.

Cardiology referral should be considered for patients with CAC
scores >=400 or >=75th percentile.

*8017 AHA/ACC/AACVPR/AAPA/ABC/GEE/ZSEMBERI/ANGELIUS/Konstantine/SAMUEL EKOW/LETHA/HARKNESS
Guideline on the Management of Blood Cholesterol: A Report of the
American College of Cardiology/American Heart Association Task Force
on Clinical Practice Guidelines. J Am Coll Cardiol.
6070;73(24):0665-0301.

*** End of Addendum ***
EXAM:
OVER-READ INTERPRETATION  CT CHEST

The following report is an over-read performed by radiologist Dr.
Jalyn Bettis [REDACTED] on 01/04/2022. This
over-read does not include interpretation of cardiac or coronary
anatomy or pathology. The coronary calcium score interpretation by
the cardiologist is attached.
FINDINGS: Vascular: Mild atherosclerosis of the descending thoracic aorta.

Mediastinum/Nodes: Visualized mediastinum and hilar regions
demonstrate no lymphadenopathy or masses.

Lungs/Pleura: Visualized lungs show no evidence of pulmonary edema,
consolidation, pneumothorax, nodule or pleural fluid.

Upper Abdomen: No acute abnormality.

Musculoskeletal: No chest wall mass or suspicious bone lesions
identified.
IMPRESSION: Mild atherosclerosis of the thoracic aorta.

## 2022-04-24 ENCOUNTER — Telehealth: Payer: Self-pay | Admitting: Internal Medicine

## 2022-04-24 MED ORDER — AMPHETAMINE-DEXTROAMPHETAMINE 10 MG PO TABS: 10.0000 mg | ORAL_TABLET | Freq: Every day | ORAL | 0 refills | Status: DC | PRN

## 2022-04-24 MED ORDER — AMPHETAMINE-DEXTROAMPHET ER 30 MG PO CP24: 30.0000 mg | ORAL_CAPSULE | ORAL | 0 refills | Status: DC

## 2022-04-24 NOTE — Telephone Encounter (Signed)
pdmp ok, rx sent

## 2022-04-24 NOTE — Telephone Encounter (Signed)
Medication: amphetamine-dextroamphetamine (ADDERALL) 10 MG tablet   amphetamine-dextroamphetamine (ADDERALL XR) 30 MG 24 hr capsule   Has the patient contacted their pharmacy? Yes.     Preferred Pharmacy:  [facility] [address], [address]   [place], [place] [address]  Phone:  [phone]  Fax:  [phone]

## 2022-04-24 NOTE — Telephone Encounter (Signed)
Requesting: Adderall XR '30mg'$  and Adderall '10mg'$   Contract:06/27/21 UDS:06/27/21 Last Visit: 07/31/21 Next Visit: 06/27/22 Last Refill 03/18/22 #30 and 0RF (for both doses)  Please Advise

## 2022-04-25 MED ORDER — AMPHETAMINE-DEXTROAMPHET ER 30 MG PO CP24: 30.0000 mg | ORAL_CAPSULE | ORAL | 0 refills | Status: DC

## 2022-04-25 MED ORDER — AMPHETAMINE-DEXTROAMPHETAMINE 10 MG PO TABS: 10.0000 mg | ORAL_TABLET | Freq: Every day | ORAL | 0 refills | Status: DC | PRN

## 2022-04-25 NOTE — Telephone Encounter (Signed)
Received response from pharmacy- Pt is on 2 doses however you placed on his Adderall '10mg'$  script not to fill until 05/2022. Can you please resend for June 2023?

## 2022-04-25 NOTE — Telephone Encounter (Signed)
Additional prescriptions sent

## 2022-04-25 NOTE — Addendum Note (Signed)
Addended by: Kathlene November E on: 04/25/2022 12:41 PM   Modules accepted: Orders

## 2022-05-08 ENCOUNTER — Encounter: Payer: Self-pay | Admitting: Internal Medicine

## 2022-05-29 ENCOUNTER — Encounter: Payer: Self-pay | Admitting: Internal Medicine

## 2022-05-29 ENCOUNTER — Ambulatory Visit (INDEPENDENT_AMBULATORY_CARE_PROVIDER_SITE_OTHER): Payer: Medicare Other | Admitting: Internal Medicine

## 2022-05-29 VITALS — BP 126/80 | HR 86 | Temp 98.2°F | Resp 16 | Ht 73.0 in | Wt 184.5 lb

## 2022-05-29 DIAGNOSIS — R2230 Localized swelling, mass and lump, unspecified upper limb: Secondary | ICD-10-CM | POA: Diagnosis not present

## 2022-05-29 NOTE — Patient Instructions (Signed)
Will refer you to the sports medicine doctor   See you next month

## 2022-05-29 NOTE — Progress Notes (Unsigned)
Subjective:    Patient ID: [name], male    DOB: 01-28-1956, 66 y.o.   MRN: [id number]  DOS:  05/29/2022 Type of visit - description: Acute  Months ago, noted a lump at the right trapezoid area. Denies the lab being painful, warm or red.  He denies fever chills, night sweats. No weight loss  Review of Systems See above   Past Medical History:  Diagnosis Date   ADD (attention deficit disorder)    adult- per psych   Adenomatous colon polyp    Cancer ([place])    basal cell skin cancer    Diverticulitis    Perforated sigmoid diverticulitis    Fall 02/2009   fx left hip and wrist   Glaucoma    LEFT eye   Heart murmur    Echo 2003 "thickened and somehow myomateous MV, mild MR"   HOH (hard of hearing)    Hydrocele 2016   Large, symptomatic left hydrocele, planned hydrocelectomy in 10/2015 with Dr. Diona Fanti   Hyperlipidemia    on meds   Insomnia    per psych   Seasonal allergies    Wears glasses     Past Surgical History:  Procedure Laterality Date   COLONOSCOPY  2017   JP-MAC-suprep(exc)-TA and benign lymphoid -recall 5 yrs   HIP SURGERY Left 2011   ORIF femur   HYDROCELE EXCISION Left 11/02/2015   Procedure: HYDROCELECTOMY ADULT;  Surgeon: Franchot Gallo, MD;  Location: [facility];  Service: Urology;  Laterality: Left;   LAPAROSCOPIC SIGMOID COLECTOMY N/A 02/11/2013   Procedure: LAPAROSCOPIC SIGMOID COLECTOMY WITH MOBILIZATION SPLENIC FLEXURE;  Surgeon: Madilyn Hook, DO;  Location: [facility];  Service: General;  Laterality: N/A;   nose biopsy  2011   Dr Allyson Sabal, no cancer   POLYPECTOMY  2017   TA and benign lymphoid   WISDOM TOOTH EXTRACTION     WRIST SURGERY Left 2011    Current Outpatient Medications  Medication Instructions   amphetamine-dextroamphetamine (ADDERALL XR) 30 MG 24 hr capsule 30 mg, Oral, BH-each morning   amphetamine-dextroamphetamine (ADDERALL) 10 MG tablet 10 mg, Oral, Daily PRN   atorvastatin (LIPITOR) 20 mg, Oral, Daily  at bedtime   azelastine (ASTELIN) 0.1 % nasal spray USE 2 SPRAYS IN EACH NOSTRIL AT BEDTIME AS NEEDED FOR RHINITIS OR ALLERGIES   clindamycin (CLINDAGEL) 1 % gel Topical, 2 times daily   diclofenac sodium (VOLTAREN) 4 g, Topical, 4 times daily PRN   Multiple Vitamins-Minerals (CENTRUM ULTRA MENS) TABS 1 tablet, Oral, 3 times weekly, -Monday, Wednesday, Friday   sildenafil (REVATIO) 20 MG tablet TAKE 3 TO 4 TABLETS BY MOUTH AT BEDTIME AS NEEDED   traZODone (DESYREL) 100 MG tablet TAKE 1/2 TO 1 TABLET(50 TO 100 MG) BY MOUTH AT BEDTIME AS NEEDED FOR SLEEP       Objective:   Physical Exam Musculoskeletal:       Arms:    BP 126/80   Pulse 86   Temp 98.2 F (36.8 C) (Oral)   Resp 16   Ht _0  (1.854 m)   Wt 184 lb 8 oz (83.7 kg)   SpO2 96%   BMI 24.34 kg/m  General:   Well developed, NAD, BMI noted. HEENT:  Normocephalic . Face symmetric, atraumatic Lymphatic system: No lymphadenopathies at the neck or armpits. MSK: See graphic Lower extremities: no pretibial edema bilaterally  Skin: Not pale. Not jaundice Neurologic:  alert & oriented X3.  Speech normal, gait appropriate for age and unassisted  Psych--  Cognition and judgment appear intact.  Cooperative with normal attention span and concentration.  Behavior appropriate. No anxious or depressed appearing.      Assessment    Assessment ADD,  Insomnia per  [facility] Counseling >>> Rx transferred to PCP 09-2019 Snoring palpable aorta: CT 2014 no AAA H/o diverticulitis, perforated, sigmoid colectomy 2014 H/o Hydrocele 2016, large, symptomatic, saw urology, excised H/o BCC, sees derm Barbae folliculitis, previously dermatology prescribed Ziana, now on Flagyl as needed H/o  heart murmur, echo 2013 --thick MV, mild MR COVID infection 10/2020, s/p MAB   PLAN Lump R shoulder. As described above, low suspicion for this being a lymphadenopathy given location. Patient is somewhat concerned, we agreed to refer to sports  medicine, my hope is that they would be able to do a soft tissue ultrasound to understand better the nature of this lump. Refer to Dr. Georgina Snell

## 2022-05-30 NOTE — Assessment & Plan Note (Signed)
Lump R shoulder. As described above, low suspicion for this being a lymphadenopathy given location. Patient is somewhat concerned, we agreed to refer to sports medicine, my hope is that they would be able to do a soft tissue ultrasound to understand better the nature of this lump. Refer to Dr. Georgina Snell

## 2022-06-03 NOTE — Progress Notes (Unsigned)
I, Wendy Poet, LAT, ATC, am serving as scribe for Dr. [name].  [name] is a 66 y.o. 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  He was last seen by Dr. Georgina Snell on 09/10/21 for f/u of L knee pain and new-onset LBP and was referred for PT for his LBPof which he completed 14 visits and was d/c on 12/25/21.  He had a prior L knee steroid injection on 07/04/21.  Today, pt reports a mass on his R upper trap x 2 months.  He specifically locates the issue to R upper trapz, superior to the spine of the scapula. Pt does not have pain over the area, and wouldn't know it was there unless he presses on it.  Swelling: no Radiating pain: no Aggravating factors: TTP Treatments tried: none  Pt reports L knee is feeling OK. He is not yet ready for an injection and will only feel a slight "twinge" of pain.   Diagnostic testing: L knee XR- 07/04/21  Pertinent review of systems: No fevers or chills.  No unexplained weight loss.  Relevant historical information: No cancer history.  Diverticulitis.  ADHD.   Exam:  BP (!) 162/94   Pulse 94   Ht '6\' 1"'$  (1.854 m)   Wt 185 lb 6.4 oz (84.1 kg)   SpO2 96%   BMI 24.46 kg/m  General: Well Developed, well nourished, and in no acute distress.   MSK: Right trapezius normal-appearing Palpable nodule middle spine of the scapula superior aspect.  Nontender.  Normal shoulder motion and strength.    Lab and Radiology Results  Diagnostic Limited MSK Ultrasound of: Mass spine of scapula right shoulder trapezius Pedunculated bony mass originating from the spine of the scapula.  No increased vascular activity. Impression: Bony mass superior spine of the mid scapula.  X-ray images right scapula obtained today personally and independently interpreted. No aggressive appearing bony lesions.  Nodule is not well visualized on these views. Await  formal radiology review    Assessment and Plan: 66 y.o. male with right trapezius nodule.  Appears to be a pedunculated bony nodule on the ultrasound.  This is probably benign bony tumor.  However I am having a hard time visualizing on the x-ray.  We will await radiology over read.  May benefit from a CT scan to further evaluate this.  Will discuss with radiology if needed.  As for his knee pain watchful waiting.  Proceed to injection when needed.  PDMP not reviewed this encounter. Orders Placed This Encounter  Procedures   [place] LIMITED JOINT SPACE STRUCTURES UP RIGHT(NO LINKED CHARGES)    Order Specific Question:   Reason for Exam (SYMPTOM  OR DIAGNOSIS REQUIRED)    Answer:   right shoulder pain    Order Specific Question:   Preferred imaging location?    Answer:   [place]   DG Scapula Right    Scapula mass along the spine of scapula    Standing Status:   Future    Number of Occurrences:   1    Standing Expiration Date:   06/05/2023    Order Specific Question:   Reason for Exam (SYMPTOM  OR DIAGNOSIS REQUIRED)    Answer:   right scapular pain    Order Specific Question:   Preferred imaging location?    Answer:   [facility]   No orders of the defined types were placed in this encounter.    Discussed warning signs or symptoms. Please see discharge instructions. Patient expresses understanding.   The above documentation has been reviewed and is accurate and complete [name], M.D.

## 2022-06-04 ENCOUNTER — Ambulatory Visit (INDEPENDENT_AMBULATORY_CARE_PROVIDER_SITE_OTHER): Payer: Medicare Other

## 2022-06-04 ENCOUNTER — Ambulatory Visit (INDEPENDENT_AMBULATORY_CARE_PROVIDER_SITE_OTHER): Payer: Medicare Other | Admitting: Family Medicine

## 2022-06-04 ENCOUNTER — Ambulatory Visit: Payer: Self-pay

## 2022-06-04 VITALS — BP 162/94 | HR 94 | Ht 73.0 in | Wt 185.4 lb

## 2022-06-04 DIAGNOSIS — M25512 Pain in left shoulder: Secondary | ICD-10-CM | POA: Diagnosis not present

## 2022-06-04 DIAGNOSIS — M25562 Pain in left knee: Secondary | ICD-10-CM

## 2022-06-04 DIAGNOSIS — G8929 Other chronic pain: Secondary | ICD-10-CM | POA: Diagnosis not present

## 2022-06-04 DIAGNOSIS — M25511 Pain in right shoulder: Secondary | ICD-10-CM

## 2022-06-04 NOTE — Patient Instructions (Addendum)
Thank you for coming in today.   Please get an Xray today before you leave   Check back with me as needed

## 2022-06-05 ENCOUNTER — Telehealth: Payer: Self-pay | Admitting: Family Medicine

## 2022-06-05 ENCOUNTER — Telehealth: Payer: Self-pay

## 2022-06-05 DIAGNOSIS — G8929 Other chronic pain: Secondary | ICD-10-CM

## 2022-06-05 DIAGNOSIS — R2230 Localized swelling, mass and lump, unspecified upper limb: Secondary | ICD-10-CM

## 2022-06-05 NOTE — Telephone Encounter (Signed)
Spoke to pt about his scapula XR results and his wife is wanting to pursue advanced imaging to determine what the nodule is. Please advised what CT scan is most appropriate.

## 2022-06-05 NOTE — Telephone Encounter (Signed)
This was indicated to the pt in prior phone call.

## 2022-06-05 NOTE — Telephone Encounter (Signed)
I spoke with radiology to confirm the best study.  Plan for CT scan of the shoulder without contrast.  Should hear soon about scheduling.

## 2022-06-05 NOTE — Progress Notes (Signed)
The nodule was not seen well on the scapula x-ray.  The x-ray did not show a profoundly bad appearing bone tumor however.  We can pursue it further with a CT scan or could do a little bit of watchful waiting and see if it gets worse.  What would you like to do?

## 2022-06-11 ENCOUNTER — Telehealth: Payer: Self-pay | Admitting: Internal Medicine

## 2022-06-11 MED ORDER — AMPHETAMINE-DEXTROAMPHET ER 30 MG PO CP24: 30.0000 mg | ORAL_CAPSULE | ORAL | 0 refills | Status: DC

## 2022-06-11 NOTE — Telephone Encounter (Signed)
Requesting: Adderall XR '30mg'$   Contract:06/27/21 UDS:06/27/21 Last Visit: 05/29/22 Next Visit: 06/27/22 Last Refill: 04/25/22 #30 and 0RF  Please Advise

## 2022-06-11 NOTE — Telephone Encounter (Signed)
PDMP okay, Rx sent x2 

## 2022-06-11 NOTE — Telephone Encounter (Signed)
Medication:   amphetamine-dextroamphetamine (ADDERALL XR) 30 MG 24 hr capsule [[id number]]    Preferred Pharmacy (with phone number or street name): [facility] [address], [address]  [place], [place] [address]  Phone:  [phone]  Fax:  [phone]   Agent: Please be advised that RX refills may take up to 3 business days. We ask that you follow-up with your pharmacy.

## 2022-06-27 ENCOUNTER — Ambulatory Visit (INDEPENDENT_AMBULATORY_CARE_PROVIDER_SITE_OTHER): Payer: Medicare Other | Admitting: Internal Medicine

## 2022-06-27 ENCOUNTER — Encounter: Payer: Self-pay | Admitting: Internal Medicine

## 2022-06-27 VITALS — BP 136/84 | HR 90 | Temp 98.6°F | Resp 16 | Ht 73.0 in | Wt 185.5 lb

## 2022-06-27 DIAGNOSIS — Z125 Encounter for screening for malignant neoplasm of prostate: Secondary | ICD-10-CM | POA: Diagnosis not present

## 2022-06-27 DIAGNOSIS — R0683 Snoring: Secondary | ICD-10-CM

## 2022-06-27 DIAGNOSIS — Z79899 Other long term (current) drug therapy: Secondary | ICD-10-CM | POA: Diagnosis not present

## 2022-06-27 DIAGNOSIS — E785 Hyperlipidemia, unspecified: Secondary | ICD-10-CM | POA: Diagnosis not present

## 2022-06-27 DIAGNOSIS — E038 Other specified hypothyroidism: Secondary | ICD-10-CM | POA: Diagnosis not present

## 2022-06-27 DIAGNOSIS — F988 Other specified behavioral and emotional disorders with onset usually occurring in childhood and adolescence: Secondary | ICD-10-CM | POA: Diagnosis not present

## 2022-06-27 LAB — COMPREHENSIVE METABOLIC PANEL
ALT: 24 U/L (ref 0–53)
AST: 25 U/L (ref 0–37)
Albumin: 4.7 g/dL (ref 3.5–5.2)
Alkaline Phosphatase: 61 U/L (ref 39–117)
BUN: 23 mg/dL (ref 6–23)
CO2: 28 mEq/L (ref 19–32)
Calcium: 9.4 mg/dL (ref 8.4–10.5)
Chloride: 100 mEq/L (ref 96–112)
Creatinine, Ser: 1.04 mg/dL (ref 0.40–1.50)
GFR: 74.58 mL/min (ref >60.00)
Glucose, Bld: 93 mg/dL (ref 70–99)
Potassium: 4.2 mEq/L (ref 3.5–5.1)
Sodium: 138 mEq/L (ref 135–145)
Total Bilirubin: 1 mg/dL (ref 0.2–1.2)
Total Protein: 7.5 g/dL (ref 6.0–8.3)

## 2022-06-27 LAB — LIPID PANEL
Cholesterol: 214 mg/dL — ABNORMAL HIGH (ref 0–200)
HDL: 94.5 mg/dL (ref >39.00)
LDL Cholesterol: 102 mg/dL — ABNORMAL HIGH (ref 0–99)
NonHDL: 119.17
Total CHOL/HDL Ratio: 2
Triglycerides: 85 mg/dL (ref 0.0–149.0)
VLDL: 17 mg/dL (ref 0.0–40.0)

## 2022-06-27 LAB — T4, FREE: Free T4: 0.75 ng/dL (ref 0.60–1.60)

## 2022-06-27 LAB — TSH: TSH: 3.54 u[IU]/mL (ref 0.35–5.50)

## 2022-06-27 LAB — PSA: PSA: 2.05 ng/mL (ref 0.10–4.00)

## 2022-06-27 NOTE — Assessment & Plan Note (Signed)
ROV Dyslipidemia: On atorvastatin, check CMP and FLP. ADD, insomnia: sxs controlled.  Continue trazodone, Adderall.  UDS today Snoring: No symptoms of  OSA.  Observation Subclinical hypothyroidism: History of, no symptoms, check TFTs. Preventive care reviewed. RTC 6 m.

## 2022-06-27 NOTE — Progress Notes (Signed)
Subjective:    Patient ID: [name], male    DOB: Dec 26, 1955, 66 y.o.   MRN: [id number]  DOS:  06/27/2022 Type of visit - description: Follow-up  Since the last office visit is doing well. We reviewed his chronic medical problems. He snores >>  described as a mild issue, denies feeling sleepy, he feels refreshed in the morning.  Review of Systems Reports no chest pain or difficulty breathing No nausea vomiting.  No blood in the stools. No dysuria or gross hematuria  Past Medical History:  Diagnosis Date   ADD (attention deficit disorder)    adult- per psych   Adenomatous colon polyp    Cancer ([place])    basal cell skin cancer    Diverticulitis    Perforated sigmoid diverticulitis    Fall 02/2009   fx left hip and wrist   Glaucoma    LEFT eye   Heart murmur    Echo 2003 "thickened and somehow myomateous MV, mild MR"   HOH (hard of hearing)    Hydrocele 2016   Large, symptomatic left hydrocele, planned hydrocelectomy in 10/2015 with Dr. Diona Fanti   Hyperlipidemia    on meds   Insomnia    per psych   Seasonal allergies    Wears glasses     Past Surgical History:  Procedure Laterality Date   COLONOSCOPY  2017   JP-MAC-suprep(exc)-TA and benign lymphoid -recall 5 yrs   HIP SURGERY Left 2011   ORIF femur   HYDROCELE EXCISION Left 11/02/2015   Procedure: HYDROCELECTOMY ADULT;  Surgeon: Franchot Gallo, MD;  Location[facility];  Service: Urology;  Laterality: Left;   LAPAROSCOPIC SIGMOID COLECTOMY N/A 02/11/2013   Procedure: LAPAROSCOPIC SIGMOID COLECTOMY WITH MOBILIZATION SPLENIC FLEXURE;  Surgeon: Madilyn Hook, DO;  Location: [facility];  Service: General;  Laterality: N/A;   nose biopsy  2011   Dr Allyson Sabal, no cancer   POLYPECTOMY  2017   TA and benign lymphoid   WISDOM TOOTH EXTRACTION     WRIST SURGERY Left 2011   Social History   Socioeconomic History   Marital status: Married    Spouse name: [name]   Number of children: 3   Years of  education: Not on file   Highest education level: Not on file  Occupational History   Occupation: Science writer, Engineer, manufacturing   Occupation: Retired, 06-2020  Tobacco Use   Smoking status: Never   Smokeless tobacco: Never  Vaping Use   Vaping Use: Never used  Substance and Sexual Activity   Alcohol use: Not Currently    Alcohol/week: 4.0 standard drinks of alcohol    Types: 4 Standard drinks or equivalent per week    Comment: MODERATION   Drug use: No   Sexual activity: Not on file  Other Topics Concern   Not on file  Social History Narrative   3 adult children, 3 g-children   Household:  pt and wife      Social Determinants of Health   Financial Resource Strain: Low Risk  (03/21/2022)   Overall Financial Resource Strain (CARDIA)    Difficulty of Paying Living Expenses: Not hard at all  Food Insecurity: No Food Insecurity (03/21/2022)   Hunger Vital Sign    Worried About Running Out of Food in the Last Year: Never true    [place] in the Last Year: Never true  Transportation Needs: No Transportation Needs (03/21/2022)   PRAPARE - Transportation    Lack of Transportation (  Medical): No    Lack of Transportation (Non-Medical): No  Physical Activity: Sufficiently Active (03/21/2022)   Exercise Vital Sign    Days of Exercise per Week: 2 days    Minutes of Exercise per Session: 150+ min  Stress: No Stress Concern Present (03/21/2022)   [place]    Feeling of Stress : Not at all  Social Connections: Socially Integrated (03/21/2022)   Social Connection and Isolation Panel [NHANES]    Frequency of Communication with Friends and Family: More than three times a week    Frequency of Social Gatherings with Friends and Family: More than three times a week    Attends Religious Services: More than 4 times per year    Active Member of [employer] or Organizations: Yes    Attends Music therapist: More than 4 times per year     Marital Status: Married  Human resources officer Violence: Not At Risk (03/21/2022)   Humiliation, Afraid, Rape, and Kick questionnaire    Fear of Current or Ex-Partner: No    Emotionally Abused: No    Physically Abused: No    Sexually Abused: No    Current Outpatient Medications  Medication Instructions   amphetamine-dextroamphetamine (ADDERALL XR) 30 MG 24 hr capsule 30 mg, Oral, BH-each morning   amphetamine-dextroamphetamine (ADDERALL) 10 MG tablet 10 mg, Oral, Daily PRN   atorvastatin (LIPITOR) 20 mg, Oral, Daily at bedtime   azelastine (ASTELIN) 0.1 % nasal spray USE 2 SPRAYS IN EACH NOSTRIL AT BEDTIME AS NEEDED FOR RHINITIS OR ALLERGIES   clindamycin (CLINDAGEL) 1 % gel Topical, 2 times daily   diclofenac sodium (VOLTAREN) 4 g, Topical, 4 times daily PRN   Multiple Vitamins-Minerals (CENTRUM ULTRA MENS) TABS 1 tablet, Oral, 3 times weekly, -Monday, Wednesday, Friday   sildenafil (REVATIO) 20 MG tablet TAKE 3 TO 4 TABLETS BY MOUTH AT BEDTIME AS NEEDED   traZODone (DESYREL) 100 MG tablet TAKE 1/2 TO 1 TABLET(50 TO 100 MG) BY MOUTH AT BEDTIME AS NEEDED FOR SLEEP       Objective:   Physical Exam BP 136/84   Pulse 90   Temp 98.6 F (37 C) (Oral)   Resp 16   Ht _0  (1.854 m)   Wt 185 lb 8 oz (84.1 kg)   SpO2 98%   BMI 24.47 kg/m  General: Well developed, NAD, BMI noted Neck: No  thyromegaly  HEENT:  Normocephalic . Face symmetric, atraumatic Lungs:  CTA B Normal respiratory effort, no intercostal retractions, no accessory muscle use. Heart: RRR,  no murmur.  Abdomen:  Not distended, soft, non-tender. No rebound or rigidity.   Lower extremities: no pretibial edema bilaterally DRE: Normal sphincter tone, brown stools, prostate mildly enlarged but not tender or nodular.   skin: Exposed areas without rash. Not pale. Not jaundice Neurologic:  alert & oriented X3.  Speech normal, gait appropriate for age and unassisted Strength symmetric and appropriate for age.   Psych: Cognition and judgment appear intact.  Cooperative with normal attention span and concentration.  Behavior appropriate. No anxious or depressed appearing.     Assessment    ASSESSMENT Dyslipidemia  ADD,  Insomnia per  [facility] Counseling >>> Rx transferred to PCP 09-2019 Snoring palpable aorta: CT 2014 no AAA H/o diverticulitis, perforated, sigmoid colectomy 2014 H/o Hydrocele 2016, large, symptomatic, saw urology, excised H/o BCC, sees derm Barbae folliculitis, previously dermatology prescribed Ziana, now on Flagyl as needed H/o  heart murmur, echo 2013 --thick MV,  mild MR COVID infection 10/2020, s/p MAB   PLAN ROV Dyslipidemia: On atorvastatin, check CMP and FLP. ADD, insomnia: sxs controlled.  Continue trazodone, Adderall.  UDS today Snoring: No symptoms of  OSA.  Observation Subclinical hypothyroidism: History of, no symptoms, check TFTs. Preventive care reviewed. RTC 6 m.

## 2022-06-27 NOTE — Patient Instructions (Addendum)
Vaccines you could consider: COVID booster I do recommend that flu shot every fall   GO TO THE LAB : Get the blood work     [place], [place] back for a checkup in  6 months     "Living will", "[place] of attorney": Advanced care planning  (If you already have a living will or healthcare power of attorney, please bring the copy to be scanned in your chart.)  Advance care planning is a process that supports adults in  understanding and sharing their preferences regarding future medical care.   The patient's preferences are recorded in documents called Advance Directives.    Advanced directives are completed (and can be modified at any time) while the patient is in full mental capacity.   The documentation should be available at all times to the patient, the family and the healthcare providers.  Bring in a copy to be scanned in your chart is an excellent idea and is recommended   This legal documents direct treatment decision making and/or appoint a surrogate to make the decision if the patient is not capable to do so.    Advance directives can be documented in many types of formats,  documents have names such as:  Lliving will  Durable power of attorney for healthcare (healthcare proxy or healthcare power of attorney)  Combined directives  Physician orders for life-sustaining treatment    More information at:  [email or web address]

## 2022-06-27 NOTE — Assessment & Plan Note (Signed)
-   Tdap 2020 - zostavax --12-2015; s/p shingrex   - covid vax : booster is an option  - flu shot q fall rec   --CCs: *cscope 05/2013--Dr Perry--multiple adenomas *cscope again 05-2016, 5 years, 09-2021, next per GI --Prostate ca screening : No symptoms, prostate exam normal, check PSA. -- POA discussed

## 2022-06-29 LAB — DRUG MONITORING PANEL 375977 , URINE
Alcohol Metabolites: POSITIVE ng/mL — AB (ref <500)
Amphetamine: 6008 ng/mL — ABNORMAL HIGH (ref <250)
Amphetamines: POSITIVE ng/mL — AB (ref <500)
Barbiturates: NEGATIVE ng/mL (ref <300)
Benzodiazepines: NEGATIVE ng/mL (ref <100)
Cocaine Metabolite: NEGATIVE ng/mL (ref <150)
Desmethyltramadol: NEGATIVE ng/mL (ref <100)
Ethyl Glucuronide (ETG): >10000 ng/mL — ABNORMAL HIGH (ref <500)
Ethyl Sulfate (ETS): >10000 ng/mL — ABNORMAL HIGH (ref <100)
Marijuana Metabolite: NEGATIVE ng/mL (ref <20)
Methamphetamine: NEGATIVE ng/mL (ref <250)
Opiates: NEGATIVE ng/mL (ref <100)
Oxycodone: NEGATIVE ng/mL (ref <100)
Tramadol: NEGATIVE ng/mL (ref <100)

## 2022-06-29 LAB — DM TEMPLATE

## 2022-07-03 ENCOUNTER — Ambulatory Visit
Admission: RE | Admit: 2022-07-03 | Discharge: 2022-07-03 | Disposition: A | Discharge status: Home / Self Care | Payer: Medicare Other | Source: Ambulatory Visit | Attending: Family Medicine | Admitting: Family Medicine

## 2022-07-03 DIAGNOSIS — G8929 Other chronic pain: Secondary | ICD-10-CM

## 2022-07-03 DIAGNOSIS — R2230 Localized swelling, mass and lump, unspecified upper limb: Secondary | ICD-10-CM

## 2022-07-08 ENCOUNTER — Encounter: Payer: Self-pay | Admitting: Family Medicine

## 2022-07-08 ENCOUNTER — Ambulatory Visit (INDEPENDENT_AMBULATORY_CARE_PROVIDER_SITE_OTHER): Payer: Medicare Other | Admitting: Family Medicine

## 2022-07-08 VITALS — BP 132/100 | HR 103 | Temp 99.1°F | Resp 18 | Ht 73.0 in | Wt 185.8 lb

## 2022-07-08 DIAGNOSIS — J0140 Acute pansinusitis, unspecified: Secondary | ICD-10-CM | POA: Insufficient documentation

## 2022-07-08 DIAGNOSIS — T7840XA Allergy, unspecified, initial encounter: Secondary | ICD-10-CM | POA: Diagnosis not present

## 2022-07-08 MED ORDER — AMOXICILLIN-POT CLAVULANATE 875-125 MG PO TABS: 1.0000 | ORAL_TABLET | Freq: Two times a day (BID) | ORAL | 0 refills | Status: DC

## 2022-07-08 NOTE — Progress Notes (Signed)
Subjective:   By signing my name below, I, [name], attest that this documentation has been prepared under the direction and in the presence of Ann Held DO 07/08/2022     Patient ID: [name], male    DOB: [birth year], 66 y.o.   MRN: [id number]  Chief Complaint  Patient presents with   Sinus Problem    X1 week, Pt states having sinus pressure and ear pressure. Productive cough. No COVID test.     Sinus Problem Associated symptoms include coughing.   Patient is in today for an office visit   He complains of constant drainage, coughing, and pressure in sinus areas that appeared about a week ago. He states that he is also on the edge of feeling achy. He mentions that symptoms are related to seasons and appears about once or twice a year. He has stated that he has taken Zyrtec, Allegra and Mucinex which has not improved his symptoms. He also uses a nose spray which does improve his symptoms. He does take Trazadone which helps him sleep at night. He is interested in being referred to an allergist to potentially resolve his symptoms.   Past Medical History:  Diagnosis Date   ADD (attention deficit disorder)    adult- per psych   Adenomatous colon polyp    Cancer ([place])    basal cell skin cancer    Diverticulitis    Perforated sigmoid diverticulitis    Fall 02/2009   fx left hip and wrist   Glaucoma    LEFT eye   Heart murmur    Echo 2003 "thickened and somehow myomateous MV, mild MR"   HOH (hard of hearing)    Hydrocele 2016   Large, symptomatic left hydrocele, planned hydrocelectomy in 10/2015 with Dr. Diona Fanti   Hyperlipidemia    on meds   Insomnia    per psych   Seasonal allergies    Wears glasses     Past Surgical History:  Procedure Laterality Date   COLONOSCOPY  2017   JP-MAC-suprep(exc)-TA and benign lymphoid -recall 5 yrs   HIP SURGERY Left 2011   ORIF femur   HYDROCELE EXCISION Left 11/02/2015   Procedure: HYDROCELECTOMY ADULT;  Surgeon:  Franchot Gallo, MD;  Location: [facility];  Service: Urology;  Laterality: Left;   LAPAROSCOPIC SIGMOID COLECTOMY N/A 02/11/2013   Procedure: LAPAROSCOPIC SIGMOID COLECTOMY WITH MOBILIZATION SPLENIC FLEXURE;  Surgeon: Madilyn Hook, DO;  Location: [facility];  Service: General;  Laterality: N/A;   nose biopsy  2011   Dr Allyson Sabal, no cancer   POLYPECTOMY  2017   TA and benign lymphoid   WISDOM TOOTH EXTRACTION     WRIST SURGERY Left 2011    Family History  Problem Relation Age of Onset   Breast cancer Mother    Heart failure Mother    Lung cancer Father        smoker   Diabetes Neg Hx    Hypertension Neg Hx    Colon cancer Neg Hx    Prostate cancer Neg Hx    Heart attack Neg Hx    Colon polyps Neg Hx    Esophageal cancer Neg Hx    Rectal cancer Neg Hx    Stomach cancer Neg Hx     Social History   Socioeconomic History   Marital status: Married    Spouse name: [name]   Number of children: 3   Years of education: Not on file   Highest  education level: Not on file  Occupational History   Occupation: Science writer, [employer]   Occupation: Retired, 06-2020  Tobacco Use   Smoking status: Never   Smokeless tobacco: Never  Vaping Use   Vaping Use: Never used  Substance and Sexual Activity   Alcohol use: Not Currently    Alcohol/week: 4.0 standard drinks of alcohol    Types: 4 Standard drinks or equivalent per week    Comment: MODERATION   Drug use: No   Sexual activity: Not on file  Other Topics Concern   Not on file  Social History Narrative   3 adult children, 3 g-children   Household:  pt and wife      Social Determinants of Health   Financial Resource Strain: Low Risk  (03/21/2022)   Overall Financial Resource Strain (CARDIA)    Difficulty of Paying Living Expenses: Not hard at all  Food Insecurity: No Food Insecurity (03/21/2022)   Hunger Vital Sign    Worried About Running Out of Food in the Last Year: Never true    [place] in the Last Year:  Never true  Transportation Needs: No Transportation Needs (03/21/2022)   PRAPARE - Hydrologist (Medical): No    Lack of Transportation (Non-Medical): No  Physical Activity: Sufficiently Active (03/21/2022)   Exercise Vital Sign    Days of Exercise per Week: 2 days    Minutes of Exercise per Session: 150+ min  Stress: No Stress Concern Present (03/21/2022)   [place]    Feeling of Stress : Not at all  Social Connections: Socially Integrated (03/21/2022)   Social Connection and Isolation Panel [NHANES]    Frequency of Communication with Friends and Family: More than three times a week    Frequency of Social Gatherings with Friends and Family: More than three times a week    Attends Religious Services: More than 4 times per year    Active Member of [employer] or Organizations: Yes    Attends Music therapist: More than 4 times per year    Marital Status: Married  Human resources officer Violence: Not At Risk (03/21/2022)   Humiliation, Afraid, Rape, and Kick questionnaire    Fear of Current or Ex-Partner: No    Emotionally Abused: No    Physically Abused: No    Sexually Abused: No    Outpatient Medications Prior to Visit  Medication Sig Dispense Refill   amphetamine-dextroamphetamine (ADDERALL XR) 30 MG 24 hr capsule Take 1 capsule (30 mg total) by mouth every morning. 30 capsule 0   amphetamine-dextroamphetamine (ADDERALL) 10 MG tablet Take 1 tablet (10 mg total) by mouth daily as needed. 30 tablet 0   atorvastatin (LIPITOR) 20 MG tablet Take 1 tablet (20 mg total) by mouth at bedtime. 90 tablet 3   azelastine (ASTELIN) 0.1 % nasal spray USE 2 SPRAYS IN EACH NOSTRIL AT BEDTIME AS NEEDED FOR RHINITIS OR ALLERGIES 30 mL 12   clindamycin (CLINDAGEL) 1 % gel Apply topically 2 (two) times daily. (Patient taking differently: Apply 1 application  topically daily as needed.) 30 g 0   diclofenac sodium  (VOLTAREN) 1 % GEL Apply 4 g topically 4 (four) times daily as needed. 100 g 0   Multiple Vitamins-Minerals (CENTRUM ULTRA MENS) TABS Take 1 tablet by mouth 3 (three) times a week. -Monday, Wednesday, Friday     sildenafil (REVATIO) 20 MG tablet TAKE 3 TO 4 TABLETS BY  MOUTH AT BEDTIME AS NEEDED 30 tablet 3   traZODone (DESYREL) 100 MG tablet TAKE 1/2 TO 1 TABLET(50 TO 100 MG) BY MOUTH AT BEDTIME AS NEEDED FOR SLEEP 90 tablet 3   No facility-administered medications prior to visit.    No Known Allergies  Review of Systems  HENT:         (+) Sinus Pressure  (+) Drainage  Respiratory:  Positive for cough.        Objective:    Physical Exam Constitutional:      General: He is not in acute distress.    Appearance: Normal appearance. He is not ill-appearing.  HENT:     Head: Normocephalic and atraumatic.     Comments: Pressure over Sinus Areas    Right Ear: Tympanic membrane, ear canal and external ear normal.     Left Ear: Tympanic membrane, ear canal and external ear normal.     Mouth/Throat:     Pharynx: No posterior oropharyngeal erythema.  Eyes:     Extraocular Movements: Extraocular movements intact.     Pupils: Pupils are equal, round, and reactive to light.  Cardiovascular:     Rate and Rhythm: Normal rate and regular rhythm.     Heart sounds: Normal heart sounds. No murmur heard.    No gallop.  Pulmonary:     Effort: Pulmonary effort is normal. No respiratory distress.     Breath sounds: Normal breath sounds. No wheezing or rales.  Skin:    General: Skin is warm and dry.  Neurological:     Mental Status: He is alert and oriented to person, place, and time.  Psychiatric:        Judgment: Judgment normal.     BP (!) 132/100 (BP Location: Right Arm, Patient Position: Sitting, Cuff Size: Normal)   Pulse (!) 103   Temp 99.1 F (37.3 C) (Oral)   Resp 18   Ht _0  (1.854 m)   Wt 185 lb 12.8 oz (84.3 kg)   SpO2 97%   BMI 24.51 kg/m  Wt Readings from Last 3  Encounters:  07/08/22 185 lb 12.8 oz (84.3 kg)  06/27/22 185 lb 8 oz (84.1 kg)  06/04/22 185 lb 6.4 oz (84.1 kg)    Diabetic Foot Exam - Simple   No data filed    Lab Results  Component Value Date   WBC 5.2 06/27/2021   HGB 15.7 06/27/2021   HCT 47.5 06/27/2021   PLT 230.0 06/27/2021   GLUCOSE 93 06/27/2022   CHOL 214 (H) 06/27/2022   TRIG 85.0 06/27/2022   HDL 94.50 06/27/2022   LDLDIRECT 132.5 12/22/2013   LDLCALC 102 (H) 06/27/2022   ALT 24 06/27/2022   AST 25 06/27/2022   NA 138 06/27/2022   K 4.2 06/27/2022   CL 100 06/27/2022   CREATININE 1.04 06/27/2022   BUN 23 06/27/2022   CO2 28 06/27/2022   TSH 3.54 06/27/2022   PSA 2.05 06/27/2022   INR 1.2 03/10/2009   HGBA1C 5.6 06/27/2021    Lab Results  Component Value Date   TSH 3.54 06/27/2022   Lab Results  Component Value Date   WBC 5.2 06/27/2021   HGB 15.7 06/27/2021   HCT 47.5 06/27/2021   MCV 96.4 06/27/2021   PLT 230.0 06/27/2021   Lab Results  Component Value Date   NA 138 06/27/2022   K 4.2 06/27/2022   CO2 28 06/27/2022   GLUCOSE 93 06/27/2022   BUN 23 06/27/2022  CREATININE 1.04 06/27/2022   BILITOT 1.0 06/27/2022   ALKPHOS 61 06/27/2022   AST 25 06/27/2022   ALT 24 06/27/2022   PROT 7.5 06/27/2022   ALBUMIN 4.7 06/27/2022   CALCIUM 9.4 06/27/2022   GFR 74.58 06/27/2022   Lab Results  Component Value Date   CHOL 214 (H) 06/27/2022   Lab Results  Component Value Date   HDL 94.50 06/27/2022   Lab Results  Component Value Date   LDLCALC 102 (H) 06/27/2022   Lab Results  Component Value Date   TRIG 85.0 06/27/2022   Lab Results  Component Value Date   CHOLHDL 2 06/27/2022   Lab Results  Component Value Date   HGBA1C 5.6 06/27/2021       Assessment & Plan:   Problem List Items Addressed This Visit       Unprioritized   Acute non-recurrent pansinusitis - Primary    abx per the orders con't flonase and astelin con't mucinex Call or rto prn       Relevant  Medications   amoxicillin-clavulanate (AUGMENTIN) 875-125 MG tablet   Other Visit Diagnoses     Allergy, initial encounter       Relevant Orders   Ambulatory referral to Allergy       Meds ordered this encounter  Medications   amoxicillin-clavulanate (AUGMENTIN) 875-125 MG tablet    Sig: Take 1 tablet by mouth 2 (two) times daily.    Dispense:  20 tablet    Refill:  0    I, Ann Held, DO, personally preformed the services described in this documentation.  All medical record entries made by the scribe were at my direction and in my presence.  I have reviewed the chart and discharge instructions (if applicable) and agree that the record reflects my personal performance and is accurate and complete. 07/08/2022   I,Amber Collins,acting as a scribe for Ann Held, DO.,have documented all relevant documentation on the behalf of Ann Held, DO,as directed by  Ann Held, DO while in the presence of Ann Held, DO.    Ann Held, DO

## 2022-07-08 NOTE — Progress Notes (Signed)
CT scan of the shoulder looked reassuring per radiology.  No evidence of bony tumor present.  Watchful waiting for now.

## 2022-07-08 NOTE — Patient Instructions (Signed)

## 2022-07-08 NOTE — Progress Notes (Signed)
Subjective:   By signing my name below, I, [name], attest that this documentation has been prepared under the direction and in the presence of Ann Held DO 07/08/2022   Patient ID: [name], male    DOB: 10/11/1956, 66 y.o.   MRN: [id number]  No chief complaint on file.   HPI Patient is in today for an office visit  Past Medical History:  Diagnosis Date   ADD (attention deficit disorder)    adult- per psych   Adenomatous colon polyp    Cancer ([place])    basal cell skin cancer    Diverticulitis    Perforated sigmoid diverticulitis    Fall 02/2009   fx left hip and wrist   Glaucoma    LEFT eye   Heart murmur    Echo 2003 "thickened and somehow myomateous MV, mild MR"   HOH (hard of hearing)    Hydrocele 2016   Large, symptomatic left hydrocele, planned hydrocelectomy in 10/2015 with Dr. Diona Fanti   Hyperlipidemia    on meds   Insomnia    per psych   Seasonal allergies    Wears glasses     Past Surgical History:  Procedure Laterality Date   COLONOSCOPY  2017   JP-MAC-suprep(exc)-TA and benign lymphoid -recall 5 yrs   HIP SURGERY Left 2011   ORIF femur   HYDROCELE EXCISION Left 11/02/2015   Procedure: HYDROCELECTOMY ADULT;  Surgeon: Franchot Gallo, MD;  Location: [facility];  Service: Urology;  Laterality: Left;   LAPAROSCOPIC SIGMOID COLECTOMY N/A 02/11/2013   Procedure: LAPAROSCOPIC SIGMOID COLECTOMY WITH MOBILIZATION SPLENIC FLEXURE;  Surgeon: Madilyn Hook, DO;  Location: [facility];  Service: General;  Laterality: N/A;   nose biopsy  2011   Dr Allyson Sabal, no cancer   POLYPECTOMY  2017   TA and benign lymphoid   WISDOM TOOTH EXTRACTION     WRIST SURGERY Left 2011    Family History  Problem Relation Age of Onset   Breast cancer Mother    Heart failure Mother    Lung cancer Father        smoker   Diabetes Neg Hx    Hypertension Neg Hx    Colon cancer Neg Hx    Prostate cancer Neg Hx    Heart attack Neg Hx    Colon polyps  Neg Hx    Esophageal cancer Neg Hx    Rectal cancer Neg Hx    Stomach cancer Neg Hx     Social History   Socioeconomic History   Marital status: Married    Spouse name: [name]   Number of children: 3   Years of education: Not on file   Highest education level: Not on file  Occupational History   Occupation: Science writer, Engineer, [employer]   Occupation: Retired, 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  Tobacco Use   Smoking status: Never   Smokeless tobacco: Never  Vaping Use   Vaping Use: Never used  Substance and Sexual Activity   Alcohol use: Not Currently    Alcohol/week: 4.0 standard drinks of alcohol    Types: 4 Standard drinks or equivalent per week    Comment: MODERATION   Drug use: No   Sexual activity: Not on file  Other Topics Concern   Not on file  Social History Narrative   3 adult children, 3 g-children   Household:  pt and wife      Social Determinants of Health   Financial Resource Strain: Low Risk  (03/21/2022)  Overall Financial Resource Strain (CARDIA)    Difficulty of Paying Living Expenses: Not hard at all  Food Insecurity: No Food Insecurity (03/21/2022)   Hunger Vital Sign    Worried About Running Out of Food in the Last Year: Never true    Ran Out of Food in the Last Year: Never true  Transportation Needs: No Transportation Needs (03/21/2022)   PRAPARE - Hydrologist (Medical): No    Lack of Transportation (Non-Medical): No  Physical Activity: Sufficiently Active (03/21/2022)   Exercise Vital Sign    Days of Exercise per Week: 2 days    Minutes of Exercise per Session: 150+ min  Stress: No Stress Concern Present (03/21/2022)   [place]    Feeling of Stress : Not at all  Social Connections: Socially Integrated (03/21/2022)   Social Connection and Isolation Panel [NHANES]    Frequency of Communication with Friends and Family: More than three times a week    Frequency of Social Gatherings with  Friends and Family: More than three times a week    Attends Religious Services: More than 4 times per year    Active Member of [employer] or Organizations: Yes    Attends Music therapist: More than 4 times per year    Marital Status: Married  Human resources officer Violence: Not At Risk (03/21/2022)   Humiliation, Afraid, Rape, and Kick questionnaire    Fear of Current or Ex-Partner: No    Emotionally Abused: No    Physically Abused: No    Sexually Abused: No    Outpatient Medications Prior to Visit  Medication Sig Dispense Refill   amphetamine-dextroamphetamine (ADDERALL XR) 30 MG 24 hr capsule Take 1 capsule (30 mg total) by mouth every morning. 30 capsule 0   amphetamine-dextroamphetamine (ADDERALL) 10 MG tablet Take 1 tablet (10 mg total) by mouth daily as needed. 30 tablet 0   atorvastatin (LIPITOR) 20 MG tablet Take 1 tablet (20 mg total) by mouth at bedtime. 90 tablet 3   azelastine (ASTELIN) 0.1 % nasal spray USE 2 SPRAYS IN EACH NOSTRIL AT BEDTIME AS NEEDED FOR RHINITIS OR ALLERGIES 30 mL 12   clindamycin (CLINDAGEL) 1 % gel Apply topically 2 (two) times daily. (Patient taking differently: Apply 1 application  topically daily as needed.) 30 g 0   diclofenac sodium (VOLTAREN) 1 % GEL Apply 4 g topically 4 (four) times daily as needed. 100 g 0   Multiple Vitamins-Minerals (CENTRUM ULTRA MENS) TABS Take 1 tablet by mouth 3 (three) times a week. -Monday, Wednesday, Friday     sildenafil (REVATIO) 20 MG tablet TAKE 3 TO 4 TABLETS BY MOUTH AT BEDTIME AS NEEDED 30 tablet 3   traZODone (DESYREL) 100 MG tablet TAKE 1/2 TO 1 TABLET(50 TO 100 MG) BY MOUTH AT BEDTIME AS NEEDED FOR SLEEP 90 tablet 3   No facility-administered medications prior to visit.    No Known Allergies  ROS     Objective:    Physical Exam Constitutional:      General: He is not in acute distress.    Appearance: Normal appearance. He is not ill-appearing.  HENT:     Head: Normocephalic and atraumatic.      Right Ear: External ear normal.     Left Ear: External ear normal.  Eyes:     Extraocular Movements: Extraocular movements intact.     Pupils: Pupils are equal, round, and reactive to light.  Cardiovascular:     Rate and Rhythm: Normal rate and regular rhythm.     Heart sounds: Normal heart sounds. No murmur heard.    No gallop.  Pulmonary:     Effort: Pulmonary effort is normal. No respiratory distress.     Breath sounds: Normal breath sounds. No wheezing or rales.  Skin:    General: Skin is warm and dry.  Neurological:     Mental Status: He is alert and oriented to person, place, and time.  Psychiatric:        Judgment: Judgment normal.     There were no vitals taken for this visit. Wt Readings from Last 3 Encounters:  06/27/22 185 lb 8 oz (84.1 kg)  06/04/22 185 lb 6.4 oz (84.1 kg)  05/29/22 184 lb 8 oz (83.7 kg)    Diabetic Foot Exam - Simple   No data filed    Lab Results  Component Value Date   WBC 5.2 06/27/2021   HGB 15.7 06/27/2021   HCT 47.5 06/27/2021   PLT 230.0 06/27/2021   GLUCOSE 93 06/27/2022   CHOL 214 (H) 06/27/2022   TRIG 85.0 06/27/2022   HDL 94.50 06/27/2022   LDLDIRECT 132.5 12/22/2013   LDLCALC 102 (H) 06/27/2022   ALT 24 06/27/2022   AST 25 06/27/2022   NA 138 06/27/2022   K 4.2 06/27/2022   CL 100 06/27/2022   CREATININE 1.04 06/27/2022   BUN 23 06/27/2022   CO2 28 06/27/2022   TSH 3.54 06/27/2022   PSA 2.05 06/27/2022   INR 1.2 03/10/2009   HGBA1C 5.6 06/27/2021    Lab Results  Component Value Date   TSH 3.54 06/27/2022   Lab Results  Component Value Date   WBC 5.2 06/27/2021   HGB 15.7 06/27/2021   HCT 47.5 06/27/2021   MCV 96.4 06/27/2021   PLT 230.0 06/27/2021   Lab Results  Component Value Date   NA 138 06/27/2022   K 4.2 06/27/2022   CO2 28 06/27/2022   GLUCOSE 93 06/27/2022   BUN 23 06/27/2022   CREATININE 1.04 06/27/2022   BILITOT 1.0 06/27/2022   ALKPHOS 61 06/27/2022   AST 25 06/27/2022   ALT 24  06/27/2022   PROT 7.5 06/27/2022   ALBUMIN 4.7 06/27/2022   CALCIUM 9.4 06/27/2022   GFR 74.58 06/27/2022   Lab Results  Component Value Date   CHOL 214 (H) 06/27/2022   Lab Results  Component Value Date   HDL 94.50 06/27/2022   Lab Results  Component Value Date   LDLCALC 102 (H) 06/27/2022   Lab Results  Component Value Date   TRIG 85.0 06/27/2022   Lab Results  Component Value Date   CHOLHDL 2 06/27/2022   Lab Results  Component Value Date   HGBA1C 5.6 06/27/2021       Assessment & Plan:   Problem List Items Addressed This Visit   None  No orders of the defined types were placed in this encounter.   I, [name], personally preformed the services described in this documentation.  All medical record entries made by the scribe were at my direction and in my presence.  I have reviewed the chart and discharge instructions (if applicable) and agree that the record reflects my personal performance and is accurate and complete. 07/08/2022   I,Amber Collins,acting as a scribe for Ann Held, DO.,have documented all relevant documentation on the behalf of Ann Held, DO,as directed by  Rosalita Chessman  Chase, DO while in the presence of Ann Held, DO.    [employer]

## 2022-07-08 NOTE — Assessment & Plan Note (Signed)
abx per the orders con't flonase and astelin con't mucinex Call or rto prn

## 2022-07-10 ENCOUNTER — Ambulatory Visit (INDEPENDENT_AMBULATORY_CARE_PROVIDER_SITE_OTHER): Payer: Medicare Other | Admitting: Family Medicine

## 2022-07-10 ENCOUNTER — Ambulatory Visit: Payer: Self-pay

## 2022-07-10 VITALS — BP 184/102 | HR 116 | Ht 73.0 in | Wt 184.4 lb

## 2022-07-10 DIAGNOSIS — G8929 Other chronic pain: Secondary | ICD-10-CM | POA: Diagnosis not present

## 2022-07-10 DIAGNOSIS — M25562 Pain in left knee: Secondary | ICD-10-CM | POA: Diagnosis not present

## 2022-07-10 DIAGNOSIS — R2230 Localized swelling, mass and lump, unspecified upper limb: Secondary | ICD-10-CM

## 2022-07-10 DIAGNOSIS — R2231 Localized swelling, mass and lump, right upper limb: Secondary | ICD-10-CM

## 2022-07-10 NOTE — Patient Instructions (Signed)
Thank you for coming in today.   You received an injection today. Seek immediate medical attention if the joint becomes red, extremely painful, or is oozing fluid.   Check back as needed 

## 2022-07-10 NOTE — Progress Notes (Signed)
I, [name], LAT, ATC acting as a scribe for Darryl Leader, MD.  [name] is a 66 y.o. male who presents to [place] at [facility] today for f/u lump on R shoulder w/ CT review and L knee pain. Pt was last seen by Dr. Georgina Snell on 06/04/22. Based on shoulder XR results, pt's wife wanted to pursue more advanced imaging and a shoulder CT was ordered. Pt's last L knee steroid injection was on 07/04/21. Today, pt reports lump on his R shoulder area is the same. No pain. Pt reports L knee started hurting again around mid-July and is wanted another steroid injection. Pt is getting ready to head to the [employer].   Dx imaging: 07/03/22 R shoulder CT  06/04/22 R scapula XR  07/04/21 L knee XR  Pertinent review of systems: No fevers or chills  Relevant historical information: Left knee DJD   Exam:  BP (!) 184/102   Pulse (!) 116   Ht '6\' 1"'$  (1.854 m)   Wt 184 lb 6.4 oz (83.6 kg)   SpO2 96%   BMI 24.33 kg/m  General: Well Developed, well nourished, and in no acute distress.   MSK: Left knee: Normal-appearing Normal motion. Tender palpation medial joint line. Stable ligamentous exam.  Normal right shoulder motion.    Lab and Radiology Results  Procedure: Real-time Ultrasound Guided Injection of left knee superior lateral patellar space Device: Philips Affiniti 50G Images permanently stored and available for review in PACS Verbal informed consent obtained.  Discussed risks and benefits of procedure. Warned about infection, bleeding, hyperglycemia damage to structures among others. Patient expresses understanding and agreement Time-out conducted.   Noted no overlying erythema, induration, or other signs of local infection.   Skin prepped in a sterile fashion.   Local anesthesia: Topical Ethyl chloride.   With sterile technique and under real time ultrasound guidance: 40 mg of Kenalog and 2 mL of Marcaine injected into the knee joint. Fluid seen entering the joint  capsule.   Completed without difficulty   Pain immediately resolved suggesting accurate placement of the medication.   Advised to call if fevers/chills, erythema, induration, drainage, or persistent bleeding.   Images permanently stored and available for review in the ultrasound unit.  Impression: Technically successful ultrasound guided injection.    EXAM: CT OF THE UPPER RIGHT EXTREMITY WITHOUT CONTRAST   TECHNIQUE: Multidetector CT imaging of the upper right extremity was performed according to the standard protocol.   RADIATION DOSE REDUCTION: This exam was performed according to the departmental dose-optimization program which includes automated exposure control, adjustment of the mA and/or kV according to patient size and/or use of iterative reconstruction technique.   COMPARISON:  Scapular x-ray 06/04/2022, CT chest 01/04/2022   FINDINGS: Bones/Joint/Cartilage   No fracture or dislocation. Normal alignment. No joint effusion. Normal acromioclavicular joint. Mild osteoarthritis of the glenohumeral joint. Degenerative disease with disc height loss at C5-6 and C6-7 with right uncovertebral degenerative changes and foraminal stenosis.   Ligaments   Ligaments are suboptimally evaluated by CT.   Muscles and Tendons Muscles are normal. No muscle atrophy. No intramuscular fluid collection or hematoma. Rotator cuff is grossly intact.   Soft tissue No fluid collection or hematoma. No soft tissue mass. Mild right lower lobe scarring versus atelectasis.   IMPRESSION: 1. No acute osseous injury of the right shoulder. 2. Mild osteoarthritis of the glenohumeral joint. 3. Degenerative disease with disc height loss at C5-6 and C6-7 with right uncovertebral degenerative  changes and foraminal stenosis.     Electronically Signed   By: Kathreen Devoid M.D.   On: 07/05/2022 13:25 I, [name], personally (independently) visualized and performed the interpretation of the images  attached in this note.      Assessment and Plan: 66 y.o. male with left knee pain due to exacerbation of DJD.  Plan for steroid injection today and continue conservative management options including Voltaren gel and quad strengthening.  This is an exacerbation of a chronic problem.  His right shoulder nodule is reassuring on CT scan.  No further tests or work-up are needed.  Watchful waiting for now.   PDMP not reviewed this encounter. Orders Placed This Encounter  Procedures   [place] LIMITED JOINT SPACE STRUCTURES LOW LEFT(NO LINKED CHARGES)    Order Specific Question:   Reason for Exam (SYMPTOM  OR DIAGNOSIS REQUIRED)    Answer:   left knee pain    Order Specific Question:   Preferred imaging location?    Answer:   [place]   No orders of the defined types were placed in this encounter.    Discussed warning signs or symptoms. Please see discharge instructions. Patient expresses understanding.   The above documentation has been reviewed and is accurate and complete [name], M.D.

## 2022-07-14 NOTE — Progress Notes (Unsigned)
New Patient Note  RE: [name] MRN: [id number] DOB: Aug 20, 1956 Date of Office Visit: 07/15/2022  Consult requested by: Ann Held, * Primary care provider: Colon Branch, MD  Chief Complaint: No chief complaint on file.  History of Present Illness: I had the pleasure of seeing [name] for initial evaluation at the [facility] [place] [facility] [place] on 07/14/2022. He is a 66 y.o. male, who is referred here by Colon Branch, MD for the evaluation of environmental allergies.  He reports symptoms of ***. Symptoms have been going on for *** years. The symptoms are present *** all year around with worsening in ***. Other triggers include exposure to ***. Anosmia: ***. Headache: ***. He has used *** with ***fair improvement in symptoms. Sinus infections: ***. Previous work up includes: ***. Previous ENT evaluation: ***. Previous sinus imaging: ***. History of nasal polyps: ***. Last eye exam: ***. History of reflux: ***.  07/08/2022 PCP visit: "He complains of constant drainage, coughing, and pressure in sinus areas that appeared about a week ago. He states that he is also on the edge of feeling achy. He mentions that symptoms are related to seasons and appears about once or twice a year. He has stated that he has taken Zyrtec, Allegra and Mucinex which has not improved his symptoms. He also uses a nose spray which does improve his symptoms. He does take Trazadone which helps him sleep at night. He is interested in being referred to an allergist to potentially resolve his symptoms."  Assessment and Plan: [name] is a 66 y.o. male with: No problem-specific Assessment & Plan notes found for this encounter.  No follow-ups on file.  No orders of the defined types were placed in this encounter.  Lab Orders  No laboratory test(s) ordered today    Other allergy screening: Asthma: {Blank single:19197::"yes","no"} Rhino conjunctivitis: {Blank single:19197::"yes","no"} Food allergy: {Blank  single:19197::"yes","no"} Medication allergy: {Blank single:19197::"yes","no"} Hymenoptera allergy: {Blank single:19197::"yes","no"} Urticaria: {Blank single:19197::"yes","no"} Eczema:{Blank single:19197::"yes","no"} History of recurrent infections suggestive of immunodeficency: {Blank single:19197::"yes","no"}  Diagnostics: Spirometry:  Tracings reviewed. His effort: {Blank single:19197::"Good reproducible efforts.","It was hard to get consistent efforts and there is a question as to whether this reflects a maximal maneuver.","Poor effort, data can not be interpreted."} FVC: ***L FEV1: ***L, ***% predicted FEV1/FVC ratio: ***% Interpretation: {Blank single:19197::"Spirometry consistent with mild obstructive disease","Spirometry consistent with moderate obstructive disease","Spirometry consistent with severe obstructive disease","Spirometry consistent with possible restrictive disease","Spirometry consistent with mixed obstructive and restrictive disease","Spirometry uninterpretable due to technique","Spirometry consistent with normal pattern","No overt abnormalities noted given today's efforts"}.  Please see scanned spirometry results for details.  Skin Testing: {Blank single:19197::"Select foods","Environmental allergy panel","Environmental allergy panel and select foods","Food allergy panel","None","Deferred due to recent antihistamines use"}. *** Results discussed with patient/family.   Past Medical History: Patient Active Problem List   Diagnosis Date Noted   Acute non-recurrent pansinusitis 07/08/2022   Erectile dysfunction 09/19/2020   Abnormality of gait 02/13/2017   PCP NOTES >>>>>>>>>>>>>>>>>>>>>>>>>> 26/37/8588   Folliculitis 50/27/7412   Heart murmur 12/23/2014   Annual physical exam 12/22/2013   Snoring 12/22/2013   Joint pain--feet pain 12/22/2013   Diverticulitis s/p lap colectomy [id number] 02/13/2013   ADD (attention deficit disorder)    Insomnia    Fatigue  07/15/2011   Past Medical History:  Diagnosis Date   ADD (attention deficit disorder)    adult- per psych   Adenomatous colon polyp    Cancer ([place])    basal cell skin cancer  Diverticulitis    Perforated sigmoid diverticulitis    Fall 02/2009   fx left hip and wrist   Glaucoma    LEFT eye   Heart murmur    Echo 2003 "thickened and somehow myomateous MV, mild MR"   HOH (hard of hearing)    Hydrocele 2016   Large, symptomatic left hydrocele, planned hydrocelectomy in 10/2015 with Dr. Diona Fanti   Hyperlipidemia    on meds   Insomnia    per psych   Seasonal allergies    Wears glasses    Past Surgical History: Past Surgical History:  Procedure Laterality Date   COLONOSCOPY  2017   JP-MAC-suprep(exc)-TA and benign lymphoid -recall 5 yrs   HIP SURGERY Left 2011   ORIF femur   HYDROCELE EXCISION Left 11/02/2015   Procedure: HYDROCELECTOMY ADULT;  Surgeon: Franchot Gallo, MD;  [facility];  Service: Urology;  Laterality: Left;   LAPAROSCOPIC SIGMOID COLECTOMY N/A 02/11/2013   Procedure: LAPAROSCOPIC SIGMOID COLECTOMY WITH MOBILIZATION SPLENIC FLEXURE;  Surgeon: Madilyn Hook, DO;  Location: [facility];  Service: General;  Laterality: N/A;   nose biopsy  2011   Dr Allyson Sabal, no cancer   POLYPECTOMY  2017   TA and benign lymphoid   WISDOM TOOTH EXTRACTION     WRIST SURGERY Left 2011   Medication List:  Current Outpatient Medications  Medication Sig Dispense Refill   amoxicillin-clavulanate (AUGMENTIN) 875-125 MG tablet Take 1 tablet by mouth 2 (two) times daily. 20 tablet 0   amphetamine-dextroamphetamine (ADDERALL XR) 30 MG 24 hr capsule Take 1 capsule (30 mg total) by mouth every morning. 30 capsule 0   amphetamine-dextroamphetamine (ADDERALL) 10 MG tablet Take 1 tablet (10 mg total) by mouth daily as needed. 30 tablet 0   atorvastatin (LIPITOR) 20 MG tablet Take 1 tablet (20 mg total) by mouth at bedtime. 90 tablet 3   azelastine (ASTELIN) 0.1 % nasal  spray USE 2 SPRAYS IN EACH NOSTRIL AT BEDTIME AS NEEDED FOR RHINITIS OR ALLERGIES 30 mL 12   clindamycin (CLINDAGEL) 1 % gel Apply topically 2 (two) times daily. (Patient taking differently: Apply 1 application  topically daily as needed.) 30 g 0   diclofenac sodium (VOLTAREN) 1 % GEL Apply 4 g topically 4 (four) times daily as needed. 100 g 0   Multiple Vitamins-Minerals (CENTRUM ULTRA MENS) TABS Take 1 tablet by mouth 3 (three) times a week. -Monday, Wednesday, Friday     sildenafil (REVATIO) 20 MG tablet TAKE 3 TO 4 TABLETS BY MOUTH AT BEDTIME AS NEEDED 30 tablet 3   traZODone (DESYREL) 100 MG tablet TAKE 1/2 TO 1 TABLET(50 TO 100 MG) BY MOUTH AT BEDTIME AS NEEDED FOR SLEEP 90 tablet 3   No current facility-administered medications for this visit.   Allergies: No Known Allergies Social History: Social History   Socioeconomic History   Marital status: Married    Spouse name: [name]   Number of children: 3   Years of education: Not on file   Highest education level: Not on file  Occupational History   Occupation: Science writer, [employer]   Occupation: Retired, 06-2020  Tobacco Use   Smoking status: Never   Smokeless tobacco: Never  Vaping Use   Vaping Use: Never used  Substance and Sexual Activity   Alcohol use: Not Currently    Alcohol/week: 4.0 standard drinks of alcohol    Types: 4 Standard drinks or equivalent per week    Comment: MODERATION   Drug use: No  Sexual activity: Not on file  Other Topics Concern   Not on file  Social History Narrative   3 adult children, 3 g-children   Household:  pt and wife      Social Determinants of Health   Financial Resource Strain: Low Risk  (03/21/2022)   Overall Financial Resource Strain (CARDIA)    Difficulty of Paying Living Expenses: Not hard at all  Food Insecurity: No Food Insecurity (03/21/2022)   Hunger Vital Sign    Worried About Running Out of Food in the Last Year: Never true    Orient in the Last Year: Never true   Transportation Needs: No Transportation Needs (03/21/2022)   PRAPARE - Hydrologist (Medical): No    Lack of Transportation (Non-Medical): No  Physical Activity: Sufficiently Active (03/21/2022)   Exercise Vital Sign    Days of Exercise per Week: 2 days    Minutes of Exercise per Session: 150+ min  Stress: No Stress Concern Present (03/21/2022)   [place]    Feeling of Stress : Not at all  Social Connections: [place] (03/21/2022)   Social Connection and Isolation Panel [NHANES]    Frequency of Communication with Friends and Family: More than three times a week    Frequency of Social Gatherings with Friends and Family: More than three times a week    Attends Religious Services: More than 4 times per year    Active Member of [employer] or Organizations: Yes    Attends Music therapist: More than 4 times per year    Marital Status: Married   Lives in a ***. Smoking: *** Occupation: ***  Environmental HistoryFreight forwarder in the house: Estate agent in the family room: {Blank single:19197::"yes","no"} Carpet in the bedroom: {Blank single:19197::"yes","no"} Heating: {Blank single:19197::"electric","gas","heat pump"} Cooling: {Blank single:19197::"central","window","heat pump"} Pet: {Blank single:19197::"yes ***","no"}  Family History: Family History  Problem Relation Age of Onset   Breast cancer Mother    Heart failure Mother    Lung cancer Father        smoker   Diabetes Neg Hx    Hypertension Neg Hx    Colon cancer Neg Hx    Prostate cancer Neg Hx    Heart attack Neg Hx    Colon polyps Neg Hx    Esophageal cancer Neg Hx    Rectal cancer Neg Hx    Stomach cancer Neg Hx    Problem                               Relation Asthma                                   *** Eczema                                *** Food allergy                           *** Allergic rhino conjunctivitis     ***  Review of Systems  Constitutional:  Negative for appetite change, chills, fever and unexpected weight change.  HENT:  Negative for congestion and rhinorrhea.   Eyes:  Negative for itching.  Respiratory:  Negative for cough, chest tightness, shortness of breath and wheezing.   Cardiovascular:  Negative for chest pain.  Gastrointestinal:  Negative for abdominal pain.  Genitourinary:  Negative for difficulty urinating.  Skin:  Negative for rash.  Neurological:  Negative for headaches.    Objective: There were no vitals taken for this visit. There is no height or weight on file to calculate BMI. Physical Exam Vitals and nursing note reviewed.  Constitutional:      Appearance: Normal appearance. He is well-developed.  HENT:     Head: Normocephalic and atraumatic.     Right Ear: Tympanic membrane and external ear normal.     Left Ear: Tympanic membrane and external ear normal.     Nose: Nose normal.     Mouth/Throat:     Mouth: Mucous membranes are moist.     Pharynx: Oropharynx is clear.  Eyes:     Conjunctiva/sclera: Conjunctivae normal.  Cardiovascular:     Rate and Rhythm: Normal rate and regular rhythm.     Heart sounds: Normal heart sounds. No murmur heard.    No friction rub. No gallop.  Pulmonary:     Effort: Pulmonary effort is normal.     Breath sounds: Normal breath sounds. No wheezing, rhonchi or rales.  Musculoskeletal:     Cervical back: Neck supple.  Skin:    General: Skin is warm.     Findings: No rash.  Neurological:     Mental Status: He is alert and oriented to person, place, and time.  Psychiatric:        Behavior: Behavior normal.    The plan was reviewed with the patient/family, and all questions/concerned were addressed.  It was my pleasure to see [name] today and participate in his care. Please feel free to contact me with any questions or concerns.  Sincerely,  Rexene Alberts, DO Allergy &  Immunology  [facility] office: [place] office: [phone]

## 2022-07-15 ENCOUNTER — Ambulatory Visit (INDEPENDENT_AMBULATORY_CARE_PROVIDER_SITE_OTHER): Payer: Medicare Other | Admitting: Allergy

## 2022-07-15 ENCOUNTER — Other Ambulatory Visit: Payer: Self-pay | Admitting: Internal Medicine

## 2022-07-15 ENCOUNTER — Encounter: Payer: Self-pay | Admitting: Allergy

## 2022-07-15 VITALS — BP 136/80 | HR 89 | Temp 98.0°F | Resp 16 | Ht 71.26 in | Wt 186.6 lb

## 2022-07-15 DIAGNOSIS — H1013 Acute atopic conjunctivitis, bilateral: Secondary | ICD-10-CM | POA: Insufficient documentation

## 2022-07-15 DIAGNOSIS — J3089 Other allergic rhinitis: Secondary | ICD-10-CM

## 2022-07-15 MED ORDER — MONTELUKAST SODIUM 10 MG PO TABS: 10.0000 mg | ORAL_TABLET | Freq: Every day | ORAL | 5 refills | Status: DC

## 2022-07-15 MED ORDER — RYALTRIS 665-25 MCG/ACT NA SUSP: 1.0000 | Freq: Two times a day (BID) | NASAL | 5 refills | Status: DC

## 2022-07-15 MED ORDER — AMPHETAMINE-DEXTROAMPHET ER 30 MG PO CP24: 30.0000 mg | ORAL_CAPSULE | ORAL | 0 refills | Status: DC

## 2022-07-15 MED ORDER — OLOPATADINE HCL 0.2 % OP SOLN: 1.0000 [drp] | Freq: Every day | OPHTHALMIC | 5 refills | Status: DC | PRN

## 2022-07-15 NOTE — Assessment & Plan Note (Signed)
.   See assessment and plan as above. 

## 2022-07-15 NOTE — Telephone Encounter (Signed)
Medication:   amphetamine-dextroamphetamine (ADDERALL XR) 30 MG 24 hr capsule [[id number]]   Has the patient contacted their pharmacy? No. (If no, request that the patient contact the pharmacy for the refill.) (If yes, when and what did the pharmacy advise?)  Preferred Pharmacy (with phone number or street name):   [facility] [place], [address]  [place], [place] [address]  Phone:  [phone]  Fax:  [phone]   Agent: Please be advised that RX refills may take up to 3 business days. We ask that you follow-up with your pharmacy.

## 2022-07-15 NOTE — Patient Instructions (Addendum)
Today's skin testing showed: Positive to ragweed.   Results given.  Environmental allergies Start environmental control measures as below. You can stop the below medications after the first frost. Finish antibiotics as prescribed.  Use over the counter antihistamines such as Zyrtec (cetirizine), Claritin (loratadine), Allegra (fexofenadine), or Xyzal (levocetirizine) daily as needed. May switch antihistamines every few months. Start Singulair (montelukast) '10mg'$  daily at night. Cautioned that in some children/adults can experience behavioral changes including hyperactivity, agitation, depression, sleep disturbances and suicidal ideations. These side effects are rare, but if you notice them you should notify me and discontinue Singulair (montelukast). Start Ryaltris (olopatadine + mometasone nasal spray combination) 1-2 sprays per nostril twice a day. Sample given. This replaces your other nasal sprays. If this works well for you, then have [facility] ship the medication to your home - prescription already sent in.  Nasal saline spray (i.e., Simply Saline) or nasal saline lavage (i.e., NeilMed) is recommended as needed and prior to medicated nasal sprays. Use olopatadine eye drops 0.2% once a day as needed for itchy/watery eyes. Consider ragwitek sublingual immunotherapy - starting in May 2023.  First dose must be given in office with 30 minutes wait. Then you may take subsequent doses at home. [email or web address]  Follow up in 8 months or sooner if needed.  Our [place] office is moving in September 2023 to a new location. New address: [address] [place], [place], [place] [address] (white building). [place] office: [phone] (same phone number).   Reducing Pollen Exposure Pollen seasons: trees (spring), grass (summer) and ragweed/weeds (fall). Keep windows closed in your home and car to lower pollen exposure.  Install air conditioning in the bedroom and throughout the house if possible.   Avoid going out in dry windy days - especially early morning. Pollen counts are highest between 5 - 10 AM and on dry, hot and windy days.  Save outside activities for late afternoon or after a heavy rain, when pollen levels are lower.  Avoid mowing of grass if you have grass pollen allergy. Be aware that pollen can also be transported indoors on people and pets.  Dry your clothes in an automatic dryer rather than hanging them outside where they might collect pollen.  Rinse hair and eyes before bedtime.

## 2022-07-15 NOTE — Telephone Encounter (Signed)
Requesting: Adderall XR '30mg'$   Contract:06/27/21 UDS:06/27/22 Last Visit: 06/27/22 Next Visit: None Last Refill: 06/11/22 #30 and 0RF  Please Advise

## 2022-07-15 NOTE — Assessment & Plan Note (Signed)
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  Tried Zyrtec, Allegra and azelastine with some benefit.  Currently has a sinus infection.  No prior AIT.  Told by ENT he has deviated septum. No recent allergy testing.  Today's skin testing showed: Positive to ragweed.   Start environmental control measures as below.  You can stop the below medications after the first frost.  Finish antibiotics as prescribed.  Use over the counter antihistamines such as Zyrtec (cetirizine), Claritin (loratadine), Allegra (fexofenadine), or Xyzal (levocetirizine) daily as needed. May switch antihistamines every few months.  Start Singulair (montelukast) '10mg'$  daily at night.  Cautioned that in some children/adults can experience behavioral changes including hyperactivity, agitation, depression, sleep disturbances and suicidal ideations. These side effects are rare, but if you notice them you should notify me and discontinue Singulair (montelukast).  Start Ryaltris (olopatadine + mometasone nasal spray combination) 1-2 sprays per nostril twice a day. Sample given.  This replaces your other nasal sprays.  If this works well for you, then have [facility] ship the medication to your home - prescription already sent in.   Nasal saline spray (i.e., Simply Saline) or nasal saline lavage (i.e., NeilMed) is recommended as needed and prior to medicated nasal sprays.  Use olopatadine eye drops 0.2% once a day as needed for itchy/watery eyes.  Consider ragwitek sublingual immunotherapy - starting in May 2023.   First dose must be given in office with 30 minutes wait. Then you may take subsequent doses at home.

## 2022-07-25 ENCOUNTER — Other Ambulatory Visit: Payer: Self-pay | Admitting: Internal Medicine

## 2022-08-15 ENCOUNTER — Telehealth: Payer: Self-pay | Admitting: Internal Medicine

## 2022-08-15 MED ORDER — AMPHETAMINE-DEXTROAMPHET ER 30 MG PO CP24
30.0000 mg | ORAL_CAPSULE | ORAL | 0 refills | Status: DC
Start: 2022-08-15 — End: 2022-08-15

## 2022-08-15 MED ORDER — AMPHETAMINE-DEXTROAMPHET ER 30 MG PO CP24: 30.0000 mg | ORAL_CAPSULE | ORAL | 0 refills | Status: DC

## 2022-08-15 NOTE — Telephone Encounter (Signed)
Medication: amphetamine-dextroamphetamine (ADDERALL XR) 30 MG 24 hr capsule    Preferred Pharmacy (with phone number or street name): [facility] [place], [address]  [place], [place] [address]  Phone:  [phone]  Fax:  [phone]   Agent: Please be advised that RX refills may take up to 3 business days. We ask that you follow-up with your pharmacy.

## 2022-08-15 NOTE — Telephone Encounter (Signed)
PDMP okay, Rx sent 

## 2022-08-15 NOTE — Telephone Encounter (Signed)
Requesting: Adderall XR '30mg'$   Contract: 06/27/21 UDS: 06/27/22 Last Visit:06/27/22 Next Visit: None Last Refill: 07/15/22 #30 and 0RF  Please Advise

## 2022-09-05 ENCOUNTER — Telehealth: Payer: Self-pay | Admitting: Internal Medicine

## 2022-09-05 MED ORDER — CLINDAMYCIN PHOSPHATE 1 % EX GEL: 1.0000 | Freq: Every day | CUTANEOUS | 0 refills | Status: AC | PRN

## 2022-09-05 NOTE — Telephone Encounter (Signed)
Medication: clindamycin (CLINDAGEL) 1 % gel [    Preferred Pharmacy (with phone number or street name): [facility] [place], [address], [place] [address] Phone: [phone]  Fax: [phone]   Agent: Please be advised that RX refills may take up to 3 business days. We ask that you follow-up with your pharmacy.

## 2022-09-05 NOTE — Telephone Encounter (Signed)
Rx sent 

## 2022-09-24 ENCOUNTER — Telehealth: Payer: Self-pay | Admitting: Internal Medicine

## 2022-09-24 MED ORDER — AMPHETAMINE-DEXTROAMPHETAMINE 10 MG PO TABS: 10.0000 mg | ORAL_TABLET | Freq: Every day | ORAL | 0 refills | Status: DC | PRN

## 2022-09-24 MED ORDER — AMPHETAMINE-DEXTROAMPHET ER 30 MG PO CP24: 30.0000 mg | ORAL_CAPSULE | ORAL | 0 refills | Status: DC

## 2022-09-24 NOTE — Telephone Encounter (Signed)
Requesting: Adderall XR '30mg'$  and Adderall '10mg'$  Contract: 06/27/21 UDS: 06/27/22 Last Visit: 06/27/22 Next Visit: None Last Refill on XR '30mg'$ : 08/15/22 #30 and 0RF Last Refill on '10mg'$ : 04/25/22 #30 and 0RF   Please Advise

## 2022-09-24 NOTE — Telephone Encounter (Signed)
Medication:  1.amphetamine-dextroamphetamine (ADDERALL XR) 30 MG 24 hr capsule   2.amphetamine-dextroamphetamine (ADDERALL) 10 MG tablet    Has the patient contacted their pharmacy? No.   Preferred Pharmacy (with phone number or street name):  [facility]ORE [place], [address] [place] [place], [place] [address] Phone: [phone]  Fax: [phone]

## 2022-09-24 NOTE — Telephone Encounter (Signed)
PDMP okay, Rx sent 

## 2022-10-11 ENCOUNTER — Other Ambulatory Visit: Payer: Self-pay | Admitting: Internal Medicine

## 2022-10-30 ENCOUNTER — Telehealth: Payer: Self-pay | Admitting: Internal Medicine

## 2022-10-30 MED ORDER — AMPHETAMINE-DEXTROAMPHET ER 30 MG PO CP24
30.0000 mg | ORAL_CAPSULE | ORAL | 0 refills | Status: DC
Start: 2022-10-30 — End: 2022-12-05

## 2022-10-30 MED ORDER — AMPHETAMINE-DEXTROAMPHETAMINE 10 MG PO TABS: 10.0000 mg | ORAL_TABLET | Freq: Every day | ORAL | 0 refills | Status: DC | PRN

## 2022-10-30 NOTE — Telephone Encounter (Signed)
Medication: amphetamine-dextroamphetamine (ADDERALL) 10 MG tablet   amphetamine-dextroamphetamine (ADDERALL XR) 30 MG 24 hr capsule    Has the patient contacted their pharmacy? No.   Preferred Pharmacy (with phone number or street name): [facility] STORE [place], [address] [address] [place], [place] [address] Phone: [phone]  Fax: [phone]   Agent: Please be advised that RX refills may take up to 3 business days. We ask that you follow-up with your pharmacy.

## 2022-10-30 NOTE — Telephone Encounter (Signed)
PDMP okay, Rx sent 

## 2022-10-30 NOTE — Telephone Encounter (Signed)
Requesting: Adderall XR '30mg'$  and Adderall '10MG'$   Contract: Yes UDS: 06/27/22 Last Visit: 06/27/22 Next Visit: Visit date not found Last Refill: 09/24/22 #30 and 0RF   Please Advise

## 2022-11-07 ENCOUNTER — Other Ambulatory Visit: Payer: Self-pay | Admitting: Internal Medicine

## 2022-11-18 ENCOUNTER — Other Ambulatory Visit: Payer: Self-pay | Admitting: Internal Medicine

## 2022-11-21 ENCOUNTER — Encounter: Payer: Self-pay | Admitting: Internal Medicine

## 2022-11-21 ENCOUNTER — Telehealth: Payer: Medicare Other | Admitting: Physician Assistant

## 2022-11-21 DIAGNOSIS — J329 Chronic sinusitis, unspecified: Secondary | ICD-10-CM | POA: Diagnosis not present

## 2022-11-21 DIAGNOSIS — J40 Bronchitis, not specified as acute or chronic: Secondary | ICD-10-CM | POA: Diagnosis not present

## 2022-11-21 MED ORDER — PROMETHAZINE-DM 6.25-15 MG/5ML PO SYRP: 5.0000 mL | ORAL_SOLUTION | Freq: Four times a day (QID) | ORAL | 0 refills | Status: DC | PRN

## 2022-11-21 MED ORDER — BENZONATATE 100 MG PO CAPS: 100.0000 mg | ORAL_CAPSULE | Freq: Three times a day (TID) | ORAL | 0 refills | Status: DC | PRN

## 2022-11-21 MED ORDER — PREDNISONE 10 MG (21) PO TBPK: ORAL_TABLET | ORAL | 0 refills | Status: DC

## 2022-11-21 MED ORDER — AMOXICILLIN-POT CLAVULANATE 875-125 MG PO TABS: 1.0000 | ORAL_TABLET | Freq: Two times a day (BID) | ORAL | 0 refills | Status: DC

## 2022-11-21 NOTE — Patient Instructions (Signed)
[name], thank you for joining Mar Daring, PA-C for today's virtual visit.  While this provider is not your primary care provider (PCP), if your PCP is located in our provider database this encounter information will be shared with them immediately following your visit.   [facility] account gives you access to today's visit and all your visits, tests, and labs performed at [facility] " click here if you don't have a [facility] account or go to [email or web address]  Consent: (Patient) [name] provided verbal consent for this virtual visit at the beginning of the encounter.  Current Medications:  Current Outpatient Medications:    amoxicillin-clavulanate (AUGMENTIN) 875-125 MG tablet, Take 1 tablet by mouth 2 (two) times daily., Disp: 20 tablet, Rfl: 0   benzonatate (TESSALON) 100 MG capsule, Take 1 capsule (100 mg total) by mouth 3 (three) times daily as needed., Disp: 30 capsule, Rfl: 0   predniSONE (STERAPRED UNI-PAK 21 TAB) 10 MG (21) TBPK tablet, 6 day taper; take as directed on package instructions, Disp: 21 tablet, Rfl: 0   promethazine-dextromethorphan (PROMETHAZINE-DM) 6.25-15 MG/5ML syrup, Take 5 mLs by mouth 4 (four) times daily as needed., Disp: 118 mL, Rfl: 0   amphetamine-dextroamphetamine (ADDERALL XR) 30 MG 24 hr capsule, Take 1 capsule (30 mg total) by mouth every morning., Disp: 30 capsule, Rfl: 0   amphetamine-dextroamphetamine (ADDERALL) 10 MG tablet, Take 1 tablet (10 mg total) by mouth daily as needed., Disp: 30 tablet, Rfl: 0   atorvastatin (LIPITOR) 20 MG tablet, Take 1 tablet (20 mg total) by mouth at bedtime., Disp: 90 tablet, Rfl: 1   azelastine (ASTELIN) 0.1 % nasal spray, Place 2 sprays into both nostrils 2 (two) times daily., Disp: 30 mL, Rfl: 12   clindamycin (CLINDAGEL) 1 % gel, Apply 1 Application topically daily as needed., Disp: 60 g, Rfl: 0   diclofenac sodium (VOLTAREN) 1 % GEL, Apply 4 g topically 4 (four)  times daily as needed., Disp: 100 g, Rfl: 0   montelukast (SINGULAIR) 10 MG tablet, Take 1 tablet (10 mg total) by mouth at bedtime., Disp: 30 tablet, Rfl: 5   Multiple Vitamins-Minerals (CENTRUM ULTRA MENS) TABS, Take 1 tablet by mouth 3 (three) times a week. -Monday, Wednesday, Friday, Disp: , Rfl:    Olopatadine HCl 0.2 % SOLN, Apply 1 drop to eye daily as needed (itchy/watery eyes)., Disp: 2.5 mL, Rfl: 5   Olopatadine-Mometasone (RYALTRIS) 665-25 MCG/ACT SUSP, Place 1-2 sprays into the nose in the morning and at bedtime., Disp: 29 g, Rfl: 5   sildenafil (REVATIO) 20 MG tablet, TAKE 3 TO 4 TABLETS BY MOUTH AT BEDTIME AS NEEDED, Disp: 30 tablet, Rfl: 3   traZODone (DESYREL) 100 MG tablet, TAKE 1/2 TO 1 TABLET(50 TO 100 MG) BY MOUTH AT BEDTIME AS NEEDED FOR SLEEP, Disp: 90 tablet, Rfl: 3   Medications ordered in this encounter:  Meds ordered this encounter  Medications   amoxicillin-clavulanate (AUGMENTIN) 875-125 MG tablet    Sig: Take 1 tablet by mouth 2 (two) times daily.    Dispense:  20 tablet    Refill:  0    Order Specific Question:   Supervising Provider    Answer:   Chase Picket [[id number]]   predniSONE (STERAPRED UNI-PAK 21 TAB) 10 MG (21) TBPK tablet    Sig: 6 day taper; take as directed on package instructions    Dispense:  21 tablet    Refill:  0    Order Specific  Question:   Supervising Provider    Answer:   Chase Picket [[id number]]   promethazine-dextromethorphan (PROMETHAZINE-DM) 6.25-15 MG/5ML syrup    Sig: Take 5 mLs by mouth 4 (four) times daily as needed.    Dispense:  118 mL    Refill:  0    Order Specific Question:   Supervising Provider    Answer:   Chase Picket [[id number]]   benzonatate (TESSALON) 100 MG capsule    Sig: Take 1 capsule (100 mg total) by mouth 3 (three) times daily as needed.    Dispense:  30 capsule    Refill:  0    Order Specific Question:   Supervising Provider    Answer:   Chase Picket [id number]     *If you need refills on  other medications prior to your next appointment, please contact your pharmacy*  Follow-Up: Call back or seek an in-person evaluation if the symptoms worsen or if the condition fails to improve as anticipated.  [place] [phone]  Other Instructions Acute Bronchitis, Adult  Acute bronchitis is sudden inflammation of the main airways (bronchi) that come off the windpipe (trachea) in the lungs. The swelling causes the airways to get smaller and make more mucus than normal. This can make it hard to breathe and can cause coughing or noisy breathing (wheezing). Acute bronchitis may last several weeks. The cough may last longer. Allergies, asthma, and exposure to smoke may make the condition worse. What are the causes? This condition can be caused by germs and by substances that irritate the lungs, including: Cold and flu viruses. The most common cause of this condition is the virus that causes the common cold. Bacteria. This is less common. Breathing in substances that irritate the lungs, including: Smoke from cigarettes and other forms of tobacco. Dust and pollen. Fumes from household cleaning products, gases, or burned fuel. Indoor or outdoor air pollution. What increases the risk? The following factors may make you more likely to develop this condition: A weak body's defense system, also called the immune system. A condition that affects your lungs and breathing, such as asthma. What are the signs or symptoms? Common symptoms of this condition include: Coughing. This may bring up clear, yellow, or green mucus from your lungs (sputum). Wheezing. Runny or stuffy nose. Having too much mucus in your lungs (chest congestion). Shortness of breath. Aches and pains, including sore throat or chest. How is this diagnosed? This condition is usually diagnosed based on: Your symptoms and medical history. A physical exam. You may also have other tests, including tests to  rule out other conditions, such as pneumonia. These tests include: A test of lung function. Test of a mucus sample to look for the presence of bacteria. Tests to check the oxygen level in your blood. Blood tests. Chest X-ray. How is this treated? Most cases of acute bronchitis clear up over time without treatment. Your health care provider may recommend: Drinking more fluids to help thin your mucus so it is easier to cough up. Taking inhaled medicine (inhaler) to improve air flow in and out of your lungs. Using a vaporizer or a humidifier. These are machines that add water to the air to help you breathe better. Taking a medicine that thins mucus and clears congestion (expectorant). Taking a medicine that prevents or stops coughing (cough suppressant). It is not common to take an antibiotic medicine for this condition. Follow these instructions at home:  Take over-the-counter and prescription  medicines only as told by your health care provider. Use an inhaler, vaporizer, or humidifier as told by your health care provider. Take two teaspoons (10 mL) of honey at bedtime to lessen coughing at night. Drink enough fluid to keep your urine pale yellow. Do not use any products that contain nicotine or tobacco. These products include cigarettes, chewing tobacco, and vaping devices, such as e-cigarettes. If you need help quitting, ask your health care provider. Get plenty of rest. Return to your normal activities as told by your health care provider. Ask your health care provider what activities are safe for you. Keep all follow-up visits. This is important. How is this prevented? To lower your risk of getting this condition again: Wash your hands often with soap and water for at least 20 seconds. If soap and water are not available, use hand sanitizer. Avoid contact with people who have cold symptoms. Try not to touch your mouth, nose, or eyes with your hands. Avoid breathing in smoke or chemical  fumes. Breathing smoke or chemical fumes will make your condition worse. Get the flu shot every year. Contact a health care provider if: Your symptoms do not improve after 2 weeks. You have trouble coughing up the mucus. Your cough keeps you awake at night. You have a fever. Get help right away if you: Cough up blood. Feel pain in your chest. Have severe shortness of breath. Faint or keep feeling like you are going to faint. Have a severe headache. Have a fever or chills that get worse. These symptoms may represent a serious problem that is an emergency. Do not wait to see if the symptoms will go away. Get medical help right away. Call your local emergency services (911 in the [place] Do not drive yourself to the hospital. Summary Acute bronchitis is inflammation of the main airways (bronchi) that come off the windpipe (trachea) in the lungs. The swelling causes the airways to get smaller and make more mucus than normal. Drinking more fluids can help thin your mucus so it is easier to cough up. Take over-the-counter and prescription medicines only as told by your health care provider. Do not use any products that contain nicotine or tobacco. These products include cigarettes, chewing tobacco, and vaping devices, such as e-cigarettes. If you need help quitting, ask your health care provider. Contact a health care provider if your symptoms do not improve after 2 weeks. This information is not intended to replace advice given to you by your health care provider. Make sure you discuss any questions you have with your health care provider. Document Revised: 02/14/2022 Document Reviewed: 03/07/2021 Elsevier Patient Education  [place].    If you have been instructed to have an in-person evaluation today at a local Urgent Care facility, please use the link below. It will take you to a list of all of our available [facility], including address, phone number and hours of  operation. Please do not delay care.  [facility]  If you or a family member do not have a primary care provider, use the link below to schedule a visit and establish care. When you choose a [facility] primary care physician or advanced practice provider, you gain a long-term partner in health. Find a Primary Care Provider  Learn more about [facility] in-office and virtual care options: [facility]

## 2022-11-21 NOTE — Progress Notes (Signed)
Virtual Visit Consent   [name], you are scheduled for a virtual visit with a [facility] provider today. Just as with appointments in the office, your consent must be obtained to participate. Your consent will be active for this visit and any virtual visit you may have with one of our providers in the next 365 days. If you have a MyChart account, a copy of this consent can be sent to you electronically.  As this is a virtual visit, video technology does not allow for your provider to perform a traditional examination. This may limit your provider's ability to fully assess your condition. If your provider identifies any concerns that need to be evaluated in person or the need to arrange testing (such as labs, EKG, etc.), we will make arrangements to do so. Although advances in technology are sophisticated, we cannot ensure that it will always work on either your end or our end. If the connection with a video visit is poor, the visit may have to be switched to a telephone visit. With either a video or telephone visit, we are not always able to ensure that we have a secure connection.  By engaging in this virtual visit, you consent to the provision of healthcare and authorize for your insurance to be billed (if applicable) for the services provided during this visit. Depending on your insurance coverage, you may receive a charge related to this service.  I need to obtain your verbal consent now. Are you willing to proceed with your visit today? [name] has provided verbal consent on 11/21/2022 for a virtual visit (video or telephone). Mar Daring, PA-C  Date: 11/21/2022 11:56 AM  Virtual Visit via Video Note   I, Mar Daring, connected with  [name]  ([id number], [birth year]) on 11/21/22 at 11:45 AM EST by a video-enabled telemedicine application and verified that I am speaking with the correct person using two identifiers.  Location: Patient: Virtual Visit Location Patient:  Home Provider: Virtual Visit Location Provider: Home Office   I discussed the limitations of evaluation and management by telemedicine and the availability of in person appointments. The patient expressed understanding and agreed to proceed.    History of Present Illness: [name] is a 67 y.o. who identifies as a male who was assigned male at birth, and is being seen today for URI symptoms.  HPI: URI  This is a new problem. The current episode started 1 to 4 weeks ago. The problem has been gradually worsening. There has been no fever. Associated symptoms include congestion, coughing (dry), headaches, a plugged ear sensation, rhinorrhea and sinus pain. Pertinent negatives include no ear pain, sore throat or swollen glands. Treatments tried: Mucinex DM, Mucinex plain, allergy medications [montelukast, ryaltris]  At home Covid 19 test is negative.    Problems:  Patient Active Problem List   Diagnosis Date Noted   Other allergic rhinitis 07/15/2022   Allergic conjunctivitis of both eyes 07/15/2022   Acute non-recurrent pansinusitis 07/08/2022   Erectile dysfunction 09/19/2020   Abnormality of gait 02/13/2017   PCP NOTES >>>>>>>>>>>>>>>>>>>>>>>>>> 96/78/9381   Folliculitis 01/75/1025   Heart murmur 12/23/2014   Annual physical exam 12/22/2013   Snoring 12/22/2013   Joint pain--feet pain 12/22/2013   Diverticulitis s/p lap colectomy [id number] 02/13/2013   ADD (attention deficit disorder)    Insomnia    Fatigue 07/15/2011    Allergies: No Known Allergies Medications:  Current Outpatient Medications:    amoxicillin-clavulanate (AUGMENTIN) 875-125 MG  tablet, Take 1 tablet by mouth 2 (two) times daily., Disp: 20 tablet, Rfl: 0   benzonatate (TESSALON) 100 MG capsule, Take 1 capsule (100 mg total) by mouth 3 (three) times daily as needed., Disp: 30 capsule, Rfl: 0   predniSONE (STERAPRED UNI-PAK 21 TAB) 10 MG (21) TBPK tablet, 6 day taper; take as directed on package instructions, Disp: 21  tablet, Rfl: 0   promethazine-dextromethorphan (PROMETHAZINE-DM) 6.25-15 MG/5ML syrup, Take 5 mLs by mouth 4 (four) times daily as needed., Disp: 118 mL, Rfl: 0   amphetamine-dextroamphetamine (ADDERALL XR) 30 MG 24 hr capsule, Take 1 capsule (30 mg total) by mouth every morning., Disp: 30 capsule, Rfl: 0   amphetamine-dextroamphetamine (ADDERALL) 10 MG tablet, Take 1 tablet (10 mg total) by mouth daily as needed., Disp: 30 tablet, Rfl: 0   atorvastatin (LIPITOR) 20 MG tablet, Take 1 tablet (20 mg total) by mouth at bedtime., Disp: 90 tablet, Rfl: 1   azelastine (ASTELIN) 0.1 % nasal spray, Place 2 sprays into both nostrils 2 (two) times daily., Disp: 30 mL, Rfl: 12   clindamycin (CLINDAGEL) 1 % gel, Apply 1 Application topically daily as needed., Disp: 60 g, Rfl: 0   diclofenac sodium (VOLTAREN) 1 % GEL, Apply 4 g topically 4 (four) times daily as needed., Disp: 100 g, Rfl: 0   montelukast (SINGULAIR) 10 MG tablet, Take 1 tablet (10 mg total) by mouth at bedtime., Disp: 30 tablet, Rfl: 5   Multiple Vitamins-Minerals (CENTRUM ULTRA MENS) TABS, Take 1 tablet by mouth 3 (three) times a week. -Monday, Wednesday, Friday, Disp: , Rfl:    Olopatadine HCl 0.2 % SOLN, Apply 1 drop to eye daily as needed (itchy/watery eyes)., Disp: 2.5 mL, Rfl: 5   Olopatadine-Mometasone (RYALTRIS) 665-25 MCG/ACT SUSP, Place 1-2 sprays into the nose in the morning and at bedtime., Disp: 29 g, Rfl: 5   sildenafil (REVATIO) 20 MG tablet, TAKE 3 TO 4 TABLETS BY MOUTH AT BEDTIME AS NEEDED, Disp: 30 tablet, Rfl: 3   traZODone (DESYREL) 100 MG tablet, TAKE 1/2 TO 1 TABLET(50 TO 100 MG) BY MOUTH AT BEDTIME AS NEEDED FOR SLEEP, Disp: 90 tablet, Rfl: 3  Observations/Objective: Patient is well-developed, well-nourished in no acute distress.  Resting comfortably at home.  Head is normocephalic, atraumatic.  No labored breathing. Speech is clear and coherent with logical content.  Patient is alert and oriented at baseline.  Deep,  bronchial cough  Assessment and Plan: 1. Sinobronchitis - amoxicillin-clavulanate (AUGMENTIN) 875-125 MG tablet; Take 1 tablet by mouth 2 (two) times daily.  Dispense: 20 tablet; Refill: 0 - predniSONE (STERAPRED UNI-PAK 21 TAB) 10 MG (21) TBPK tablet; 6 day taper; take as directed on package instructions  Dispense: 21 tablet; Refill: 0 - promethazine-dextromethorphan (PROMETHAZINE-DM) 6.25-15 MG/5ML syrup; Take 5 mLs by mouth 4 (four) times daily as needed.  Dispense: 118 mL; Refill: 0 - benzonatate (TESSALON) 100 MG capsule; Take 1 capsule (100 mg total) by mouth 3 (three) times daily as needed.  Dispense: 30 capsule; Refill: 0  - Worsening over a week despite OTC medications - Will treat with Augmentin, Prednisone, Promethazine DM and tessalon perles - Can continue Mucinex  - Push fluids.  - Rest.  - Steam and humidifier can help - Seek in person evaluation if worsening or symptoms fail to improve    Follow Up Instructions: I discussed the assessment and treatment plan with the patient. The patient was provided an opportunity to ask questions and all were answered. The patient agreed with  the plan and demonstrated an understanding of the instructions.  A copy of instructions were sent to the patient via MyChart unless otherwise noted below.    The patient was advised to call back or seek an in-person evaluation if the symptoms worsen or if the condition fails to improve as anticipated.  Time:  I spent 10 minutes with the patient via telehealth technology discussing the above problems/concerns.    Mar Daring, PA-C

## 2022-11-26 ENCOUNTER — Encounter: Payer: Self-pay | Admitting: Emergency Medicine

## 2022-11-26 ENCOUNTER — Ambulatory Visit
Admission: EM | Admit: 2022-11-26 | Discharge: 2022-11-26 | Disposition: A | Discharge status: Home / Self Care | Payer: Medicare Other | Attending: Emergency Medicine | Admitting: Emergency Medicine

## 2022-11-26 ENCOUNTER — Other Ambulatory Visit: Payer: Self-pay

## 2022-11-26 DIAGNOSIS — J40 Bronchitis, not specified as acute or chronic: Secondary | ICD-10-CM

## 2022-11-26 DIAGNOSIS — J329 Chronic sinusitis, unspecified: Secondary | ICD-10-CM

## 2022-11-26 DIAGNOSIS — J302 Other seasonal allergic rhinitis: Secondary | ICD-10-CM

## 2022-11-26 DIAGNOSIS — J3089 Other allergic rhinitis: Secondary | ICD-10-CM

## 2022-11-26 MED ORDER — TRIAMCINOLONE ACETONIDE 40 MG/ML IJ SUSP
60.0000 mg | Freq: Once | INTRAMUSCULAR | Status: AC
  Administered 2022-11-26: 60 mg via INTRAMUSCULAR

## 2022-11-26 MED ORDER — DOXYCYCLINE HYCLATE 100 MG PO TABS: 100.0000 mg | ORAL_TABLET | Freq: Two times a day (BID) | ORAL | 0 refills | Status: AC

## 2022-11-26 MED ORDER — METHYLPREDNISOLONE 8 MG PO TABS: 32.0000 mg | ORAL_TABLET | Freq: Every day | ORAL | 0 refills | Status: AC

## 2022-11-26 NOTE — Discharge Instructions (Addendum)
Please read below to learn more about the medications, dosages and frequencies that I recommend to help alleviate your symptoms and to get you feeling better soon:   Vibramycin (doxycycline): Please discontinue Augmentin and begin doxycycline 1 tablet twice daily for the next 10 days.  Doxycycline provides better coverage of methicillin-resistant staph aureus (MRSA).  I believe that you have obtained all the benefit you can from Augmentin and would benefit from more directed therapy with doxycycline        Kenalog IM (triamcinolone):  To quickly address your significant respiratory inflammation, you were provided with an injection of Kenalog in the office today.  You should continue to feel the full benefit of the steroid for the next 24-36 hours.    Medrol (methylprednisolone): This is an oral steroid that will significantly calm your upper and lower airways.  Please take 4 tablets daily until your symptoms are meaningfully improved.  When you feel that you have achieved best benefit from the steroid, begin taking 2 tablets daily for 2 more days or until the prescription is complete, which ever is sooner.   Allegra (fexofenadine): This is an excellent second-generation antihistamine that helps to reduce respiratory inflammatory response to environmental allergens.  This medication is not known to cause daytime sleepiness so it can be taken in the daytime.  This medication works best if taken daily.   Singulair (montelukast): This is a mast cell stabilizer that works well with antihistamines.  Mast cells are responsible for stimulating histamine production so you can imagine that if we can reduce the activity of your mast cells, then fewer histamines will be produced and inflammation caused by allergy exposure will be significantly reduced.  I recommend that you take this medication at the same time you take your antihistamine.   Ryaltris (mometasone/azelastine): This is a combination antihistamine and  steroid nasal spray.  This medication works best used on a daily basis.     Robitussin, Mucinex (guaifenesin): This is an expectorant.  This helps break up chest congestion and loosen up thick nasal drainage making phlegm and drainage more liquid and therefore easier to remove.  I recommend being 400 mg three times daily as needed.      Please follow-up within the next 5-7 days either with your primary care provider or urgent care if your symptoms do not resolve.  If you do not have a primary care provider, we will assist you in finding one.        Thank you for visiting urgent care today.  We appreciate the opportunity to participate in your care.

## 2022-11-26 NOTE — ED Provider Notes (Signed)
[facility]    CSN: [id number] Arrival date & time: 11/26/22  9233    HISTORY   Chief Complaint  Patient presents with   Cough   HPI [name] is a pleasant, 67 y.o. male who presents to urgent care today. Patient complains of nasal congestion, nonproductive cough present for the past several weeks which is not getting any better.  Patient states he has been taking Mucinex and Mucinex D along with his prescribed Singulair and Ryaltris nasal spray with no relief.  Patient had a telehealth visit on November 21, 2022 where he was prescribed prednisone and Augmentin.  Patient states he continues to not feel any better.  Patient has normal vital signs on arrival today.  Patient denies known sick contacts.  Patient denies fever, sore throat, headache, body ache, chills, nausea, vomiting, diarrhea.  Patient was seen by allergy and immunology in August 2023, was advised to begin antihistamine, Singulair and Ryaltris and to discontinue all of them after the first frost due to a finding of ragweed allergy.  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  The history is provided by the patient.   Past Medical History:  Diagnosis Date   ADD (attention deficit disorder)    adult- per psych   Adenomatous colon polyp    Cancer ([place])    basal cell skin cancer    Diverticulitis    Perforated sigmoid diverticulitis    Fall 02/2009   fx left hip and wrist   Glaucoma    LEFT eye   Heart murmur    Echo 2003 "thickened and somehow myomateous MV, mild MR"   HOH (hard of hearing)    Hydrocele 2016   Large, symptomatic left hydrocele, planned hydrocelectomy in 10/2015 with Dr. Diona Fanti   Hyperlipidemia    on meds   Insomnia    per psych   Seasonal allergies    Wears glasses    Patient Active Problem List   Diagnosis Date Noted   Other allergic rhinitis 07/15/2022   Allergic conjunctivitis of both eyes 07/15/2022   Acute non-recurrent pansinusitis  07/08/2022   Erectile dysfunction 09/19/2020   Abnormality of gait 02/13/2017   PCP NOTES >>>>>>>>>>>>>>>>>>>>>>>>>> 00/76/2263   Folliculitis 33/54/5625   Heart murmur 12/23/2014   Annual physical exam 12/22/2013   Snoring 12/22/2013   Joint pain--feet pain 12/22/2013   Diverticulitis s/p lap colectomy [id number] 02/13/2013   ADD (attention deficit disorder)    Insomnia    Fatigue 07/15/2011   Past Surgical History:  Procedure Laterality Date   COLONOSCOPY  2017   JP-MAC-suprep(exc)-TA and benign lymphoid -recall 5 yrs   HIP SURGERY Left 2011   ORIF femur   HYDROCELE EXCISION Left 11/02/2015   Procedure: HYDROCELECTOMY ADULT;  Surgeon: Franchot Gallo, MD;  [facility];  Service: Urology;  Laterality: Left;   LAPAROSCOPIC SIGMOID COLECTOMY N/A 02/11/2013   Procedure: LAPAROSCOPIC SIGMOID COLECTOMY WITH MOBILIZATION SPLENIC FLEXURE;  Surgeon: Madilyn Hook, DO;  Location[facility];  Service: General;  Laterality: N/A;   nose biopsy  2011   Dr Allyson Sabal, no cancer   POLYPECTOMY  2017   TA and benign lymphoid   WISDOM TOOTH EXTRACTION     WRIST SURGERY Left 2011    Home Medications    Prior to Admission medications   Medication Sig Start Date End Date Taking? Authorizing Provider  amoxicillin-clavulanate (AUGMENTIN) 875-125 MG tablet Take 1 tablet by mouth 2 (two) times daily. 11/21/22  Fenton Malling M, PA-C  amphetamine-dextroamphetamine (ADDERALL XR) 30 MG 24 hr capsule Take 1 capsule (30 mg total) by mouth every morning. 10/30/22   Colon Branch, MD  amphetamine-dextroamphetamine (ADDERALL) 10 MG tablet Take 1 tablet (10 mg total) by mouth daily as needed. 10/30/22   Colon Branch, MD  atorvastatin (LIPITOR) 20 MG tablet Take 1 tablet (20 mg total) by mouth at bedtime. 11/07/22   Colon Branch, MD  azelastine (ASTELIN) 0.1 % nasal spray Place 2 sprays into both nostrils 2 (two) times daily. 07/25/22   Colon Branch, MD  benzonatate (TESSALON) 100 MG capsule Take  1 capsule (100 mg total) by mouth 3 (three) times daily as needed. 11/21/22   Mar Daring, PA-C  clindamycin (CLINDAGEL) 1 % gel Apply 1 Application topically daily as needed. 09/05/22   Colon Branch, MD  diclofenac sodium (VOLTAREN) 1 % GEL Apply 4 g topically 4 (four) times daily as needed. 08/18/19   Colon Branch, MD  montelukast (SINGULAIR) 10 MG tablet Take 1 tablet (10 mg total) by mouth at bedtime. 07/15/22   Garnet Sierras, DO  Multiple Vitamins-Minerals (CENTRUM ULTRA MENS) TABS Take 1 tablet by mouth 3 (three) times a week. -Monday, Wednesday, Friday    [provider]  Olopatadine HCl 0.2 % SOLN Apply 1 drop to eye daily as needed (itchy/watery eyes). 07/15/22   Garnet Sierras, DO  Olopatadine-Mometasone (RYALTRIS) [phone] MCG/ACT SUSP Place 1-2 sprays into the nose in the morning and at bedtime. 07/15/22   Garnet Sierras, DO  predniSONE (STERAPRED UNI-PAK 21 TAB) 10 MG (21) TBPK tablet 6 day taper; take as directed on package instructions 11/21/22   Mar Daring, PA-C  promethazine-dextromethorphan (PROMETHAZINE-DM) 6.25-15 MG/5ML syrup Take 5 mLs by mouth 4 (four) times daily as needed. 11/21/22   Mar Daring, PA-C  sildenafil (REVATIO) 20 MG tablet TAKE 3 TO 4 TABLETS BY MOUTH AT BEDTIME AS NEEDED 11/19/22   Colon Branch, MD  traZODone (DESYREL) 100 MG tablet TAKE 1/2 TO 1 TABLET(50 TO 100 MG) BY MOUTH AT BEDTIME AS NEEDED FOR SLEEP 10/12/22   Colon Branch, MD    Family History Family History  Problem Relation Age of Onset   Breast cancer Mother    Heart failure Mother    Lung cancer Father        smoker   Diabetes Neg Hx    Hypertension Neg Hx    Colon cancer Neg Hx    Prostate cancer Neg Hx    Heart attack Neg Hx    Colon polyps Neg Hx    Esophageal cancer Neg Hx    Rectal cancer Neg Hx    Stomach cancer Neg Hx    Social History Social History   Tobacco Use   Smoking status: Never    Passive exposure: Never   Smokeless tobacco: Never  Vaping Use   Vaping  Use: Never used  Substance Use Topics   Alcohol use: Not Currently    Alcohol/week: 4.0 standard drinks of alcohol    Types: 4 Standard drinks or equivalent per week    Comment: MODERATION   Drug use: No   Allergies   Patient has no known allergies.  Review of Systems Review of Systems Pertinent findings revealed after performing a 14 point review of systems has been noted in the history of present illness.  Physical Exam Triage Vital Signs ED Triage Vitals  Enc Vitals Group  BP 09/14/21 0827 (!) 147/82     Pulse Rate 09/14/21 0827 72     Resp 09/14/21 0827 18     Temp 09/14/21 0827 98.3 F (36.8 C)     Temp Source 09/14/21 0827 Oral     SpO2 09/14/21 0827 98 %     Weight --      Height --      Head Circumference --      Peak Flow --      Pain Score 09/14/21 0826 5     Pain Loc --      Pain Edu? --      Excl. in [place]? --   No data found.  Updated Vital Signs BP 102/69 (BP Location: Right Arm)   Pulse 96   Temp 98 F (36.7 C) (Oral)   Resp 19   Ht _0  (1.854 m)   Wt 185 lb (83.9 kg)   SpO2 98%   BMI 24.41 kg/m   Physical Exam Vitals and nursing note reviewed.  Constitutional:      General: He is not in acute distress.    Appearance: Normal appearance. He is not ill-appearing.  HENT:     Head: Normocephalic and atraumatic.     Salivary Glands: Right salivary gland is not diffusely enlarged or tender. Left salivary gland is not diffusely enlarged or tender.     Right Ear: Ear canal and external ear normal. No drainage. A middle ear effusion is present. There is no impacted cerumen. Tympanic membrane is bulging. Tympanic membrane is not injected or erythematous.     Left Ear: Ear canal and external ear normal. No drainage. A middle ear effusion is present. There is no impacted cerumen. Tympanic membrane is bulging. Tympanic membrane is not injected or erythematous.     Ears:     Comments: Bilateral EACs normal, both TMs bulging with clear fluid    Nose:  Rhinorrhea present. No nasal deformity, septal deviation, signs of injury, nasal tenderness, mucosal edema or congestion. Rhinorrhea is clear.     Right Nostril: Occlusion present. No foreign body, epistaxis or septal hematoma.     Left Nostril: Occlusion present. No foreign body, epistaxis or septal hematoma.     Right Turbinates: Enlarged, swollen and pale.     Left Turbinates: Enlarged, swollen and pale.     Right Sinus: No maxillary sinus tenderness or frontal sinus tenderness.     Left Sinus: No maxillary sinus tenderness or frontal sinus tenderness.     Mouth/Throat:     Lips: Pink. No lesions.     Mouth: Mucous membranes are moist. No oral lesions.     Pharynx: Oropharynx is clear. Uvula midline. No posterior oropharyngeal erythema or uvula swelling.     Tonsils: No tonsillar exudate. 0 on the right. 0 on the left.     Comments: Postnasal drip Eyes:     General: Lids are normal.        Right eye: No discharge.        Left eye: No discharge.     Extraocular Movements: Extraocular movements intact.     Conjunctiva/sclera: Conjunctivae normal.     Right eye: Right conjunctiva is not injected.     Left eye: Left conjunctiva is not injected.  Neck:     Trachea: Trachea and phonation normal.  Cardiovascular:     Rate and Rhythm: Normal rate and regular rhythm.     Pulses: Normal pulses.     Heart sounds:  Normal heart sounds. No murmur heard.    No friction rub. No gallop.  Pulmonary:     Effort: Pulmonary effort is normal. No accessory muscle usage, prolonged expiration or respiratory distress.     Breath sounds: Normal breath sounds. No stridor, decreased air movement or transmitted upper airway sounds. No decreased breath sounds, wheezing, rhonchi or rales.  Chest:     Chest wall: No tenderness.  Musculoskeletal:        General: Normal range of motion.     Cervical back: Normal range of motion and neck supple. Normal range of motion.  Lymphadenopathy:     Cervical: No cervical  adenopathy.  Skin:    General: Skin is warm and dry.     Findings: No erythema or rash.  Neurological:     General: No focal deficit present.     Mental Status: He is alert and oriented to person, place, and time.  Psychiatric:        Mood and Affect: Mood normal.        Behavior: Behavior normal.     Visual Acuity Right Eye Distance:   Left Eye Distance:   Bilateral Distance:    Right Eye Near:   Left Eye Near:    Bilateral Near:     UC Couse / Diagnostics / Procedures:     Radiology No results found.  Procedures Procedures (including critical care time) EKG  Pending results:  Labs Reviewed - No data to display  Medications Ordered in UC: Medications  triamcinolone acetonide (KENALOG-40) injection 60 mg (60 mg Intramuscular Given 11/26/22 0859)    UC Diagnoses / Final Clinical Impressions(s)   I have reviewed the triage vital signs and the nursing notes.  Pertinent labs & imaging results that were available during my care of the patient were reviewed by me and considered in my medical decision making (see chart for details).    Final diagnoses:  Sinobronchitis  Perennial allergic rhinitis with seasonal variation   Augmentin discontinued, patient advised to begin doxycycline instead for better coverage of MRSA.  Patient advised to resume antihistamine on a daily basis and continue all of his other current allergy medications.  Patient also provided with Kenalog injection and advised to take  methylprednisolone daily with a short taper of the end once feeling better.  Return precautions advised. Please see discharge instructions below for further details of plan of care as provided to patient. ED Prescriptions     Medication Sig Dispense Auth. Provider   methylPREDNISolone (MEDROL) 8 MG tablet Take 4 tablets (32 mg total) by mouth daily for 7 days. 28 tablet Lynden Oxford Scales, PA-C   doxycycline (VIBRA-TABS) 100 MG tablet Take 1 tablet (100 mg total) by mouth  2 (two) times daily for 14 days. 28 tablet Lynden Oxford Scales, PA-C      PDMP not reviewed this encounter.  Disposition Upon Discharge:  Condition: stable for discharge home Home: take medications as prescribed; routine discharge instructions as discussed; follow up as advised.  Patient presented with an acute illness with associated systemic symptoms and significant discomfort requiring urgent management. In my opinion, this is a condition that a prudent lay person (someone who possesses an average knowledge of health and medicine) may potentially expect to result in complications if not addressed urgently such as respiratory distress, impairment of bodily function or dysfunction of bodily organs.   Routine symptom specific, illness specific and/or disease specific instructions were discussed with the patient and/or caregiver at length.  As such, the patient has been evaluated and assessed, work-up was performed and treatment was provided in alignment with urgent care protocols and evidence based medicine.  Patient/parent/caregiver has been advised that the patient may require follow up for further testing and treatment if the symptoms continue in spite of treatment, as clinically indicated and appropriate.  If the patient was tested for COVID-19, Influenza and/or RSV, then the patient/parent/guardian was advised to isolate at home pending the results of his/her diagnostic coronavirus test and potentially longer if they're positive. I have also advised pt that if his/her COVID-19 test returns positive, it's recommended to self-isolate for at least 10 days after symptoms first appeared AND until fever-free for 24 hours without fever reducer AND other symptoms have improved or resolved. Discussed self-isolation recommendations as well as instructions for household member/close contacts as per the [facility] and [place] DHHS, and also gave patient the [place] packet with this  information.  Patient/parent/caregiver has been advised to return to the [facility] or PCP in 3-5 days if no better; to PCP or the Emergency Department if new signs and symptoms develop, or if the current signs or symptoms continue to change or worsen for further workup, evaluation and treatment as clinically indicated and appropriate  The patient will follow up with their current PCP if and as advised. If the patient does not currently have a PCP we will assist them in obtaining one.   The patient may need specialty follow up if the symptoms continue, in spite of conservative treatment and management, for further workup, evaluation, consultation and treatment as clinically indicated and appropriate.  Patient/parent/caregiver verbalized understanding and agreement of plan as discussed.  All questions were addressed during visit.  Please see discharge instructions below for further details of plan.  Discharge Instructions:   Discharge Instructions      Please read below to learn more about the medications, dosages and frequencies that I recommend to help alleviate your symptoms and to get you feeling better soon:   Vibramycin (doxycycline): Please discontinue Augmentin and begin doxycycline 1 tablet twice daily for the next 10 days.  Doxycycline provides better coverage of methicillin-resistant staph aureus (MRSA).  I believe that you have obtained all the benefit you can from Augmentin and would benefit from more directed therapy with doxycycline        Kenalog IM (triamcinolone):  To quickly address your significant respiratory inflammation, you were provided with an injection of Kenalog in the office today.  You should continue to feel the full benefit of the steroid for the next 24-36 hours.    Medrol (methylprednisolone): This is an oral steroid that will significantly calm your upper and lower airways.  Please take 4 tablets daily until your symptoms are meaningfully improved.  When you feel that  you have achieved best benefit from the steroid, begin taking 2 tablets daily for 2 more days or until the prescription is complete, which ever is sooner.   Allegra (fexofenadine): This is an excellent second-generation antihistamine that helps to reduce respiratory inflammatory response to environmental allergens.  This medication is not known to cause daytime sleepiness so it can be taken in the daytime.  This medication works best if taken daily.   Singulair (montelukast): This is a mast cell stabilizer that works well with antihistamines.  Mast cells are responsible for stimulating histamine production so you can imagine that if we can reduce the activity of your mast cells, then fewer histamines will be produced and inflammation caused by  allergy exposure will be significantly reduced.  I recommend that you take this medication at the same time you take your antihistamine.   Ryaltris (mometasone/azelastine): This is a combination antihistamine and steroid nasal spray.  This medication works best used on a daily basis.     Robitussin, Mucinex (guaifenesin): This is an expectorant.  This helps break up chest congestion and loosen up thick nasal drainage making phlegm and drainage more liquid and therefore easier to remove.  I recommend being 400 mg three times daily as needed.      Please follow-up within the next 5-7 days either with your primary care provider or urgent care if your symptoms do not resolve.  If you do not have a primary care provider, we will assist you in finding one.        Thank you for visiting urgent care today.  We appreciate the opportunity to participate in your care.       This office note has been dictated using Museum/gallery curator.  Unfortunately, this method of dictation can sometimes lead to typographical or grammatical errors.  I apologize for your inconvenience in advance if this occurs.  Please do not hesitate to reach out to me if clarification  is needed.      Lynden Oxford Scales, [place] 11/26/22 [phone]

## 2022-11-26 NOTE — ED Triage Notes (Signed)
Pt arrive c/o cold like symptoms for a week with increase nasal congestion and cough, not getting any better with treatment.   Home treatment: Augmenting and Prednisone.

## 2022-12-05 ENCOUNTER — Telehealth: Payer: Self-pay | Admitting: Internal Medicine

## 2022-12-05 MED ORDER — AMPHETAMINE-DEXTROAMPHETAMINE 10 MG PO TABS: 10.0000 mg | ORAL_TABLET | Freq: Every day | ORAL | 0 refills | Status: DC | PRN

## 2022-12-05 MED ORDER — AMPHETAMINE-DEXTROAMPHET ER 30 MG PO CP24: 30.0000 mg | ORAL_CAPSULE | ORAL | 0 refills | Status: DC

## 2022-12-05 NOTE — Telephone Encounter (Signed)
Medication: amphetamine-dextroamphetamine (ADDERALL XR) 30 MG 24 hr capsule  amphetamine-dextroamphetamine (ADDERALL) 10 MG tablet  Has the patient contacted their pharmacy? No.   Preferred Pharmacy:    [facility] [place], [address] [place], [place] [address] Phone: [phone]  Fax: [phone]

## 2022-12-05 NOTE — Telephone Encounter (Signed)
Pdmp ok, rx sent x 2

## 2022-12-05 NOTE — Telephone Encounter (Signed)
Requesting: Adderall  Contract: 06/27/21 UDS: 06/27/22 Last Visit: 06/27/22 Next Visit: None Last Refill: 10/30/22 #30 and 0RF   Please Advise

## 2022-12-27 ENCOUNTER — Other Ambulatory Visit: Payer: Self-pay | Admitting: *Deleted

## 2022-12-27 MED ORDER — RYALTRIS 665-25 MCG/ACT NA SUSP: 1.0000 | Freq: Two times a day (BID) | NASAL | 5 refills | Status: DC

## 2023-01-17 ENCOUNTER — Telehealth: Payer: Self-pay | Admitting: Internal Medicine

## 2023-01-17 MED ORDER — AMPHETAMINE-DEXTROAMPHET ER 30 MG PO CP24: 30.0000 mg | ORAL_CAPSULE | ORAL | 0 refills | Status: DC

## 2023-01-17 MED ORDER — AMPHETAMINE-DEXTROAMPHETAMINE 10 MG PO TABS: 10.0000 mg | ORAL_TABLET | Freq: Every day | ORAL | 0 refills | Status: DC | PRN

## 2023-01-17 NOTE — Telephone Encounter (Signed)
Please reach out to the patient and schedule a routine visit

## 2023-01-17 NOTE — Telephone Encounter (Signed)
Prescription Request  01/17/2023  Is this a "Controlled Substance" medicine? Yes  LOV: Visit date not found  What is the name of the medication or equipment?   amphetamine-dextroamphetamine (ADDERALL XR) 30 MG 24 hr capsule [id number]   amphetamine-dextroamphetamine (ADDERALL) 10 MG tablet [id number]   Have you contacted your pharmacy to request a refill? No   Which pharmacy would you like this sent to?   [facility] [place], [address] [place] [address] Phone: [phone] Fax: [phone]  Patient notified that their request is being sent to the clinical staff for review and that they should receive a response within 2 business days.   Please advise at Mobile [phone] (mobile)

## 2023-01-17 NOTE — Telephone Encounter (Signed)
Requesting: Adderall XR '30mg'$  and Adderall '10mg'$  Contract: yes UDS: 06/27/22 Last Visit: 06/27/2022 Next Visit: Visit date not found Last Refill: 12/05/22  Please Advise

## 2023-01-20 NOTE — Telephone Encounter (Signed)
Appt made for 01/27/23.

## 2023-01-27 ENCOUNTER — Encounter: Payer: Self-pay | Admitting: Internal Medicine

## 2023-01-27 ENCOUNTER — Ambulatory Visit (INDEPENDENT_AMBULATORY_CARE_PROVIDER_SITE_OTHER): Payer: Medicare Other | Admitting: Internal Medicine

## 2023-01-27 VITALS — BP 136/88 | HR 94 | Temp 98.5°F | Resp 16 | Ht 71.0 in | Wt 181.4 lb

## 2023-01-27 DIAGNOSIS — G4700 Insomnia, unspecified: Secondary | ICD-10-CM

## 2023-01-27 DIAGNOSIS — F988 Other specified behavioral and emotional disorders with onset usually occurring in childhood and adolescence: Secondary | ICD-10-CM

## 2023-01-27 DIAGNOSIS — Z79899 Other long term (current) drug therapy: Secondary | ICD-10-CM | POA: Diagnosis not present

## 2023-01-27 NOTE — Progress Notes (Unsigned)
Subjective:    Patient ID: [name], male    DOB: 1955-11-30, 67 y.o.   MRN: [id number]  DOS:  01/27/2023 Type of visit - description: f/u   Since  last office visit is doing well. Today we talk about ADD, high cholesterol.   Review of Systems See above   Past Medical History:  Diagnosis Date   ADD (attention deficit disorder)    adult- per psych   Adenomatous colon polyp    Cancer ([place])    basal cell skin cancer    Diverticulitis    Perforated sigmoid diverticulitis    Fall 02/2009   fx left hip and wrist   Glaucoma    LEFT eye   Heart murmur    Echo 2003 "thickened and somehow myomateous MV, mild MR"   HOH (hard of hearing)    Hydrocele 2016   Large, symptomatic left hydrocele, planned hydrocelectomy in 10/2015 with Dr. Diona Fanti   Hyperlipidemia    on meds   Insomnia    per psych   Seasonal allergies    Wears glasses     Past Surgical History:  Procedure Laterality Date   COLONOSCOPY  2017   JP-MAC-suprep(exc)-TA and benign lymphoid -recall 5 yrs   HIP SURGERY Left 2011   ORIF femur   HYDROCELE EXCISION Left 11/02/2015   Procedure: HYDROCELECTOMY ADULT;  Surgeon: Franchot Gallo, MD;  Location: [facility];  Service: Urology;  Laterality: Left;   LAPAROSCOPIC SIGMOID COLECTOMY N/A 02/11/2013   Procedure: LAPAROSCOPIC SIGMOID COLECTOMY WITH MOBILIZATION SPLENIC FLEXURE;  Surgeon: Madilyn Hook, DO;  Location[facility];  Service: General;  Laterality: N/A;   nose biopsy  2011   Dr Allyson Sabal, no cancer   POLYPECTOMY  2017   TA and benign lymphoid   WISDOM TOOTH EXTRACTION     WRIST SURGERY Left 2011    Current Outpatient Medications  Medication Instructions   amphetamine-dextroamphetamine (ADDERALL XR) 30 MG 24 hr capsule 30 mg, Oral, BH-each morning   amphetamine-dextroamphetamine (ADDERALL XR) 30 MG 24 hr capsule 30 mg, Oral, BH-each morning   amphetamine-dextroamphetamine (ADDERALL) 10 MG tablet 10 mg, Oral, Daily PRN    amphetamine-dextroamphetamine (ADDERALL) 10 MG tablet 10 mg, Oral, Daily PRN   atorvastatin (LIPITOR) 20 mg, Oral, Daily at bedtime   clindamycin (CLINDAGEL) 1 % gel 1 Application, Topical, Daily PRN   diclofenac sodium (VOLTAREN) 4 g, Topical, 4 times daily PRN   montelukast (SINGULAIR) 10 mg, Oral, Daily at bedtime   Multiple Vitamins-Minerals (CENTRUM ULTRA MENS) TABS 1 tablet, Oral, 3 times weekly, -Monday, Wednesday, Friday   Olopatadine HCl 0.2 % SOLN 1 drop, Ophthalmic, Daily PRN   Olopatadine-Mometasone (RYALTRIS) 665-25 MCG/ACT SUSP 1-2 sprays, Nasal, 2 times daily   sildenafil (REVATIO) 20 MG tablet TAKE 3 TO 4 TABLETS BY MOUTH AT BEDTIME AS NEEDED   traZODone (DESYREL) 100 MG tablet TAKE 1/2 TO 1 TABLET(50 TO 100 MG) BY MOUTH AT BEDTIME AS NEEDED FOR SLEEP       Objective:   Physical Exam BP 136/88   Pulse 94   Temp 98.5 F (36.9 C) (Oral)   Resp 16   Ht '5\' 11"'$  (1.803 m)   Wt 181 lb 6 oz (82.3 kg)   SpO2 95%   BMI 25.30 kg/m  General:   Well developed, NAD, BMI noted. HEENT:  Normocephalic . Face symmetric, atraumatic Lower extremities: no pretibial edema bilaterally  Skin: Not pale. Not jaundice Neurologic:  alert & oriented X3.  Speech  normal, gait appropriate for age and unassisted Psych--  Cognition and judgment appear intact.  Cooperative with normal attention span and concentration.  Behavior appropriate. No anxious or depressed appearing.      Assessment    ASSESSMENT Dyslipidemia  ADD,  Insomnia per  [facility] Counseling >>> Rx transferred to PCP 09-2019 Snoring palpable aorta: CT 2014 no AAA H/o diverticulitis, perforated, sigmoid colectomy 2014 H/o Hydrocele 2016, large, symptomatic, saw urology, excised H/o BCC, sees derm Barbae folliculitis, previously dermatology prescribed Ziana, now on Flagyl as needed H/o  heart murmur, echo 2013 --thick MV, mild MR   PLAN ROV ADD, insomnia:  Currently on trazodone, and Adderall. He takes  Adderall XR most days, also Adderall 10 mg prn (particularly when the pharmacy does not have the XR available) PDMP okay, will call for RF when needed.  Check UDS.  Contract today. Dyslipidemia: On atorvastatin, last LDL slightly higher than before, he is still very active, I offered to recheck an [place] but at the end we agreed to recheck on RTC 06/2023 for CPX. RTC August 2024, CPX.

## 2023-01-27 NOTE — Patient Instructions (Addendum)
I am glad you are doing well.  Vaccines I recommend:  Covid booster (if not done after September 2023) RSV vaccine   GO TO THE LAB : Provide a urine sample   GO TO THE FRONT DESK, PLEASE SCHEDULE YOUR APPOINTMENTS Come back for physical exam by 06-2023

## 2023-01-28 NOTE — Assessment & Plan Note (Signed)
ROV ADD, insomnia:  Currently on trazodone, and Adderall. He takes Adderall XR most days, also Adderall 10 mg prn (particularly when the pharmacy does not have the XR available) PDMP okay, will call for RF when needed.  Check UDS.  Contract today. Dyslipidemia: On atorvastatin, last LDL slightly higher than before, he is still very active, I offered to recheck an [place] but at the end we agreed to recheck on RTC 06/2023 for CPX. RTC August 2024, CPX.

## 2023-01-29 LAB — DRUG MONITORING PANEL 375977 , URINE
Alcohol Metabolites: POSITIVE ng/mL — AB (ref <500)
Amphetamine: 6208 ng/mL — ABNORMAL HIGH (ref <250)
Amphetamines: POSITIVE ng/mL — AB (ref <500)
Barbiturates: NEGATIVE ng/mL (ref <300)
Benzodiazepines: NEGATIVE ng/mL (ref <100)
Cocaine Metabolite: NEGATIVE ng/mL (ref <150)
Desmethyltramadol: NEGATIVE ng/mL (ref <100)
Ethyl Glucuronide (ETG): 9644 ng/mL — ABNORMAL HIGH (ref <500)
Ethyl Sulfate (ETS): 3563 ng/mL — ABNORMAL HIGH (ref <100)
Marijuana Metabolite: NEGATIVE ng/mL (ref <20)
Methamphetamine: NEGATIVE ng/mL (ref <250)
Opiates: NEGATIVE ng/mL (ref <100)
Oxycodone: NEGATIVE ng/mL (ref <100)
Tramadol: NEGATIVE ng/mL (ref <100)

## 2023-01-29 LAB — DM TEMPLATE

## 2023-02-10 ENCOUNTER — Telehealth: Payer: Self-pay | Admitting: Internal Medicine

## 2023-02-10 MED ORDER — AMPHETAMINE-DEXTROAMPHET ER 30 MG PO CP24: 30.0000 mg | ORAL_CAPSULE | ORAL | 0 refills | Status: DC

## 2023-02-10 NOTE — Telephone Encounter (Signed)
Pt requesting his prescription for amphetamine-dextroamphetamine (ADDERALL XR) 30 MG 24 hr capsule  [facility]. [facility] is still on back order and isn't sure when they will get medication back in.

## 2023-02-10 NOTE — Telephone Encounter (Signed)
PDMP okay, Rx sent 

## 2023-02-10 NOTE — Addendum Note (Signed)
Addended byDamita Dunnings D on: 02/10/2023 11:55 AM   Modules accepted: Orders

## 2023-02-10 NOTE — Addendum Note (Signed)
Addended by: Kathlene November E on: 02/10/2023 12:57 PM   Modules accepted: Orders

## 2023-02-21 ENCOUNTER — Ambulatory Visit (INDEPENDENT_AMBULATORY_CARE_PROVIDER_SITE_OTHER): Payer: Medicare Other | Admitting: Allergy

## 2023-02-21 ENCOUNTER — Encounter: Payer: Self-pay | Admitting: Allergy

## 2023-02-21 VITALS — BP 130/80 | HR 97 | Temp 98.1°F | Resp 18

## 2023-02-21 DIAGNOSIS — H1013 Acute atopic conjunctivitis, bilateral: Secondary | ICD-10-CM

## 2023-02-21 DIAGNOSIS — J3089 Other allergic rhinitis: Secondary | ICD-10-CM | POA: Diagnosis not present

## 2023-02-21 MED ORDER — RAGWITEK 12 AMB A 1-U SL SUBL: 1.0000 | SUBLINGUAL_TABLET | Freq: Every day | SUBLINGUAL | 5 refills | Status: DC

## 2023-02-21 MED ORDER — MONTELUKAST SODIUM 10 MG PO TABS: 10.0000 mg | ORAL_TABLET | Freq: Every day | ORAL | 5 refills | Status: DC

## 2023-02-21 MED ORDER — RYALTRIS 665-25 MCG/ACT NA SUSP: 1.0000 | Freq: Two times a day (BID) | NASAL | 11 refills | Status: DC

## 2023-02-21 NOTE — Assessment & Plan Note (Addendum)
Past history - Perennial rhinoconjunctivitis symptoms for 40+ years with worsening in the fall.  Tried Zyrtec, Allegra and azelastine with some benefit.  No prior AIT.  Told by ENT he has deviated septum. 2023 skin testing showed: Positive to ragweed.  Interim history - Ryaltris and Singulair helped in the fall. Some nasal congestion now.  Continue environmental control measures as below. Use over the counter antihistamines such as Zyrtec (cetirizine), Claritin (loratadine), Allegra (fexofenadine), or Xyzal (levocetirizine) daily as needed. May switch antihistamines every few months. May use Singulair (montelukast) 10mg  daily at night. May use Ryaltris (olopatadine + mometasone nasal spray combination) 1-2 sprays per nostril twice a day.  Nasal saline spray (i.e., Simply Saline) or nasal saline lavage (i.e., NeilMed) is recommended as needed and prior to medicated nasal sprays. Use olopatadine eye drops 0.2% once a day as needed for itchy/watery eyes. Consider ragwitek sublingual immunotherapy - starting in May 2023.  First dose must be given in office with 30 minutes wait. Then you may take subsequent doses at home. I'll send in the prescription and hopefully it will be covered. If not covered consider allergy shots.

## 2023-02-21 NOTE — Patient Instructions (Addendum)
Environmental allergies 2023 skin testing showed: Positive to ragweed.  Continue environmental control measures as below.  Use over the counter antihistamines such as Zyrtec (cetirizine), Claritin (loratadine), Allegra (fexofenadine), or Xyzal (levocetirizine) daily as needed. May switch antihistamines every few months. May use Singulair (montelukast) 10mg  daily at night. May use Ryaltris (olopatadine + mometasone nasal spray combination) 1-2 sprays per nostril twice a day.  Nasal saline spray (i.e., Simply Saline) or nasal saline lavage (i.e., NeilMed) is recommended as needed and prior to medicated nasal sprays. Use olopatadine eye drops 0.2% once a day as needed for itchy/watery eyes. Consider ragwitek sublingual immunotherapy - starting in May 2023.  First dose must be given in office with 30 minutes wait. Then you may take subsequent doses at home. [email or web address] I'll send in the prescription and hopefully it will be covered.   Follow up in 6 months or sooner if needed.  Reducing Pollen Exposure Pollen seasons: trees (spring), grass (summer) and ragweed/weeds (fall). Keep windows closed in your home and car to lower pollen exposure.  Install air conditioning in the bedroom and throughout the house if possible.  Avoid going out in dry windy days - especially early morning. Pollen counts are highest between 5 - 10 AM and on dry, hot and windy days.  Save outside activities for late afternoon or after a heavy rain, when pollen levels are lower.  Avoid mowing of grass if you have grass pollen allergy. Be aware that pollen can also be transported indoors on people and pets.  Dry your clothes in an automatic dryer rather than hanging them outside where they might collect pollen.  Rinse hair and eyes before bedtime.

## 2023-02-21 NOTE — Assessment & Plan Note (Signed)
.   See assessment and plan as above. 

## 2023-02-21 NOTE — Progress Notes (Signed)
Follow Up Note  RE: [name] D Becht MRN: [id number] DOB: 1956/02/07 Date of Office Visit: 02/21/2023  Referring provider: Wanda PlumpPaz, Jose E, MD Primary care provider: Wanda PlumpPaz, Jose E, MD  Chief Complaint: Allergies (Consider allergy injections)  History of Present Illness: I had the pleasure of seeing [name] for a follow up visit at the [facility] on 02/21/2023. He is a 67 y.o. male, who is being followed for allergic rhinoconjunctivitis. His previous allergy office visit was on 07/15/2022 with Dr. Selena BattenKim. Today is a regular follow up visit.  Allergic rhino conjunctivitis Some nasal congestion. Currently taking Ryaltris 2 sprays per nostril BID with some benefit. He did use Singulair in the fall which helped. Interested in starting ragwitek.   Assessment and Plan: [name] is a 67 y.o. male with: Other allergic rhinitis Past history - Perennial rhinoconjunctivitis symptoms for 40+ years with worsening in the fall.  Tried Zyrtec, Allegra and azelastine with some benefit.  No prior AIT.  Told by ENT he has deviated septum. 2023 skin testing showed: Positive to ragweed.  Interim history - Ryaltris and Singulair helped in the fall. Some nasal congestion now.  Continue environmental control measures as below. Use over the counter antihistamines such as Zyrtec (cetirizine), Claritin (loratadine), Allegra (fexofenadine), or Xyzal (levocetirizine) daily as needed. May switch antihistamines every few months. May use Singulair (montelukast) 10mg  daily at night. May use Ryaltris (olopatadine + mometasone nasal spray combination) 1-2 sprays per nostril twice a day.  Nasal saline spray (i.e., Simply Saline) or nasal saline lavage (i.e., NeilMed) is recommended as needed and prior to medicated nasal sprays. Use olopatadine eye drops 0.2% once a day as needed for itchy/watery eyes. Consider ragwitek sublingual immunotherapy - starting in May 2023.  First dose must be given in office with 30 minutes  wait. Then you may take subsequent doses at home. I'll send in the prescription and hopefully it will be covered. If not covered consider allergy shots.    Allergic conjunctivitis of both eyes See assessment and plan as above.  Return in about 6 months (around 08/23/2023).  Meds ordered this encounter  Medications   Short Ragweed Pollen Ext (RAGWITEK) 12 AMB A 1-U SUBL    Sig: Place 1 tablet under the tongue daily. First dose must be taken in Dr. [name]'s office.    Dispense:  30 tablet    Refill:  5   montelukast (SINGULAIR) 10 MG tablet    Sig: Take 1 tablet (10 mg total) by mouth at bedtime.    Dispense:  30 tablet    Refill:  5   Olopatadine-Mometasone (RYALTRIS) 665-25 MCG/ACT SUSP    Sig: Place 1-2 sprays into the nose in the morning and at bedtime.    Dispense:  29 g    Refill:  11    [phone]   Lab Orders  No laboratory test(s) ordered today    Diagnostics: None.   Medication List:  Current Outpatient Medications  Medication Sig Dispense Refill   amphetamine-dextroamphetamine (ADDERALL XR) 30 MG 24 hr capsule Take 1 capsule (30 mg total) by mouth every morning. 30 capsule 0   amphetamine-dextroamphetamine (ADDERALL) 10 MG tablet Take 1 tablet (10 mg total) by mouth daily as needed. 30 tablet 0   atorvastatin (LIPITOR) 20 MG tablet Take 1 tablet (20 mg total) by mouth at bedtime. 90 tablet 1   clindamycin (CLINDAGEL) 1 % gel Apply 1 Application topically daily as needed. 60 g 0   diclofenac  sodium (VOLTAREN) 1 % GEL Apply 4 g topically 4 (four) times daily as needed. 100 g 0   montelukast (SINGULAIR) 10 MG tablet Take 1 tablet (10 mg total) by mouth at bedtime. 30 tablet 5   Multiple Vitamins-Minerals (CENTRUM ULTRA MENS) TABS Take 1 tablet by mouth 3 (three) times a week. -Monday, Wednesday, Friday     Short Ragweed Pollen Ext (RAGWITEK) 12 AMB A 1-U SUBL Place 1 tablet under the tongue daily. First dose must be taken in Dr. Elmyra Ricks office. 30 tablet 5   sildenafil  (REVATIO) 20 MG tablet TAKE 3 TO 4 TABLETS BY MOUTH AT BEDTIME AS NEEDED 30 tablet 3   traZODone (DESYREL) 100 MG tablet TAKE 1/2 TO 1 TABLET(50 TO 100 MG) BY MOUTH AT BEDTIME AS NEEDED FOR SLEEP 90 tablet 3   Olopatadine-Mometasone (RYALTRIS) 665-25 MCG/ACT SUSP Place 1-2 sprays into the nose in the morning and at bedtime. 29 g 11   No current facility-administered medications for this visit.   Allergies: No Known Allergies I reviewed his past medical history, social history, family history, and environmental history and no significant changes have been reported from his previous visit.  Review of Systems  Constitutional:  Negative for appetite change, chills, fever and unexpected weight change.  HENT:  Positive for congestion. Negative for postnasal drip, rhinorrhea and sneezing.   Eyes:  Negative for itching.  Respiratory:  Negative for chest tightness, shortness of breath and wheezing.   Cardiovascular:  Negative for chest pain.  Gastrointestinal:  Negative for abdominal pain.  Genitourinary:  Negative for difficulty urinating.  Skin:  Negative for rash.  Allergic/Immunologic: Positive for environmental allergies.  Neurological:  Negative for headaches.    Objective: BP 130/80 (BP Location: Left Arm, Patient Position: Sitting, Cuff Size: Normal)   Pulse 97   Temp 98.1 F (36.7 C) (Temporal)   Resp 18   SpO2 98%  There is no height or weight on file to calculate BMI. Physical Exam Vitals and nursing note reviewed.  Constitutional:      Appearance: Normal appearance. He is well-developed.  HENT:     Head: Normocephalic and atraumatic.     Right Ear: Tympanic membrane and external ear normal.     Left Ear: Tympanic membrane and external ear normal.     Nose: Congestion present.     Mouth/Throat:     Mouth: Mucous membranes are moist.     Pharynx: Oropharynx is clear.  Eyes:     Conjunctiva/sclera: Conjunctivae normal.  Cardiovascular:     Rate and Rhythm: Normal rate  and regular rhythm.     Heart sounds: Normal heart sounds. No murmur heard.    No friction rub. No gallop.  Pulmonary:     Effort: Pulmonary effort is normal.     Breath sounds: Normal breath sounds. No wheezing, rhonchi or rales.  Musculoskeletal:     Cervical back: Neck supple.  Skin:    General: Skin is warm.     Findings: No rash.  Neurological:     Mental Status: He is alert and oriented to person, place, and time.  Psychiatric:        Behavior: Behavior normal.   Previous notes and tests were reviewed. The plan was reviewed with the patient/family, and all questions/concerned were addressed.  It was my pleasure to see Garvin today and participate in his care. Please feel free to contact me with any questions or concerns.  Sincerely,  Wyline Mood, DO Allergy & Immunology  [facility] office: [place] office: [phone]

## 2023-02-24 ENCOUNTER — Telehealth: Payer: Self-pay

## 2023-02-24 ENCOUNTER — Other Ambulatory Visit (HOSPITAL_COMMUNITY): Payer: Self-pay

## 2023-02-24 NOTE — Telephone Encounter (Signed)
PA request received via pharmacy for Ragwitek 12AMB A 1-U sublingual tablets  PA has been submitted to [employer] Part D via CMM and is pending determination  Key: [id number]

## 2023-02-24 NOTE — Telephone Encounter (Signed)
Providers office is responsible for appeals at this time.

## 2023-02-24 NOTE — Telephone Encounter (Signed)
PA has been DENIED. Denial letter has been attached in patients documents.  

## 2023-02-24 NOTE — Telephone Encounter (Signed)
Please call the insurance.  I read the denial reason and it does NOT make sense.  Do they want me to change his diagnosis to allergic rhinitis and allergic conjunctivitis due to ragweed pollen?  What is the medically accepted indication for them then?

## 2023-02-28 NOTE — Telephone Encounter (Signed)
Letter written. Please fax to his insurance. Thank you.

## 2023-02-28 NOTE — Telephone Encounter (Signed)
Appeal Letter has been faxed to: Part D Appeal and Grievance Department - [phone].

## 2023-02-28 NOTE — Telephone Encounter (Signed)
Forwarding message to provider for next step. 

## 2023-03-13 ENCOUNTER — Encounter: Payer: Self-pay | Admitting: Internal Medicine

## 2023-03-14 ENCOUNTER — Telehealth: Payer: Self-pay | Admitting: Internal Medicine

## 2023-03-14 MED ORDER — AMPHETAMINE-DEXTROAMPHET ER 30 MG PO CP24: 30.0000 mg | ORAL_CAPSULE | ORAL | 0 refills | Status: DC

## 2023-03-14 MED ORDER — AMPHETAMINE-DEXTROAMPHETAMINE 10 MG PO TABS: 10.0000 mg | ORAL_TABLET | Freq: Every day | ORAL | 0 refills | Status: DC | PRN

## 2023-03-14 NOTE — Addendum Note (Signed)
Addended byConrad Puako D on: 03/14/2023 08:14 AM   Modules accepted: Orders

## 2023-03-14 NOTE — Addendum Note (Signed)
Addended by: [name] on: 03/14/2023 08:51 AM   Modules accepted: Orders

## 2023-03-14 NOTE — Telephone Encounter (Signed)
Prescription Request  03/14/2023  Is this a "Controlled Substance" medicine? Yes  LOV: 01/27/2023  What is the name of the medication or equipment?  amphetamine-dextroamphetamine (ADDERALL XR) 30 MG 24 hr capsule   Have you contacted your pharmacy to request a refill? No   Which pharmacy would you like this sent to?   [id number] [place],  - [address] [place] [address] Phone: [phone] Fax: [phone]    Patient notified that their request is being sent to the clinical staff for review and that they should receive a response within 2 business days.   Please advise at Mobile [phone] (mobile)

## 2023-03-14 NOTE — Telephone Encounter (Signed)
PDMP reviewed, prescriptions sent

## 2023-03-14 NOTE — Telephone Encounter (Signed)
Requesting: Adderall XR 30mg   Contract: 01/27/23 UDS: 01/27/23 Last Visit: 01/27/23 Next Visit: None Last Refill: 01/17/23 #30 and 0RF   Please Advise

## 2023-03-19 NOTE — Telephone Encounter (Signed)
It will be 1 shot.

## 2023-03-19 NOTE — Telephone Encounter (Signed)
Patient is interested in allergy shots. I will mail out the allergy shot packet for the patient to review and sign. Patient verbalized understanding that we will nee him to bring the forms back to the [place] office when he is ready to schedule an appointment 3 weeks out from the day the forms are brought back.   Please advise on the number of shots

## 2023-03-19 NOTE — Telephone Encounter (Signed)
Allergy shot information has been placed in the mail out box on the shot room side.

## 2023-03-19 NOTE — Telephone Encounter (Signed)
Please call patient and let him know that even with the appeal letter I wrote to his insurance, they DENIED the ragwitek prescription.  He can contact his insurance as well if he wants to appeal but there's nothing more that we can do to get it covered.  We can do allergy shots if interested.

## 2023-03-24 ENCOUNTER — Ambulatory Visit (INDEPENDENT_AMBULATORY_CARE_PROVIDER_SITE_OTHER): Payer: Medicare Other | Admitting: *Deleted

## 2023-03-24 DIAGNOSIS — Z0000 Encounter for general adult medical examination without abnormal findings: Secondary | ICD-10-CM

## 2023-03-24 NOTE — Progress Notes (Signed)
Subjective:  Pt completed ADLs, Fall risk, and SDOH during e-check in on 03/23/23.  Answers verified with pt.    [name] is a 67 y.o. male who presents for Medicare Annual/Subsequent preventive examination.  I connected with  [name] on 03/24/23 by a audio enabled telemedicine application and verified that I am speaking with the correct person using two identifiers.  Patient Location: Home  Provider Location: Office/Clinic  I discussed the limitations of evaluation and management by telemedicine. The patient expressed understanding and agreed to proceed.   Review of Systems     Cardiac Risk Factors include: advanced age (>46men, >62 women);male gender;dyslipidemia     Objective:    There were no vitals filed for this visit. There is no height or weight on file to calculate BMI.     03/24/2023    1:08 PM 11/26/2022    8:20 AM 03/21/2022    8:53 AM 10/04/2021   10:11 AM 05/28/2016    8:10 AM 05/14/2016    8:05 AM 11/02/2015    6:16 AM  Advanced Directives  Does Patient Have a Medical Advance Directive? Yes No Yes Yes Yes Yes No  Type of Estate agent of [place];Living will  Healthcare Power of [place];Living will Healthcare Power of [place];Living will Living will;Healthcare Power of Attorney Living will;Healthcare Power of Attorney   Does patient want to make changes to medical advance directive?   No - Patient declined      Copy of Healthcare Power of Attorney in Chart? No - copy requested  No - copy requested No - copy requested No - copy requested    Would patient like information on creating a medical advance directive?  No - Patient declined     No - patient declined information    Current Medications (verified) Outpatient Encounter Medications as of 03/24/2023  Medication Sig   amphetamine-dextroamphetamine (ADDERALL XR) 30 MG 24 hr capsule Take 1 capsule (30 mg total) by mouth every morning.   amphetamine-dextroamphetamine (ADDERALL) 10 MG  tablet Take 1 tablet (10 mg total) by mouth daily as needed.   atorvastatin (LIPITOR) 20 MG tablet Take 1 tablet (20 mg total) by mouth at bedtime.   clindamycin (CLINDAGEL) 1 % gel Apply 1 Application topically daily as needed.   diclofenac sodium (VOLTAREN) 1 % GEL Apply 4 g topically 4 (four) times daily as needed.   montelukast (SINGULAIR) 10 MG tablet Take 1 tablet (10 mg total) by mouth at bedtime.   Multiple Vitamins-Minerals (CENTRUM ULTRA MENS) TABS Take 1 tablet by mouth 3 (three) times a week. -Monday, Wednesday, Friday   Olopatadine-Mometasone (RYALTRIS) 540-98 MCG/ACT SUSP Place 1-2 sprays into the nose in the morning and at bedtime.   sildenafil (REVATIO) 20 MG tablet TAKE 3 TO 4 TABLETS BY MOUTH AT BEDTIME AS NEEDED   traZODone (DESYREL) 100 MG tablet TAKE 1/2 TO 1 TABLET(50 TO 100 MG) BY MOUTH AT BEDTIME AS NEEDED FOR SLEEP   [DISCONTINUED] Short Ragweed Pollen Ext (RAGWITEK) 12 AMB A 1-U SUBL Place 1 tablet under the tongue daily. First dose must be taken in Dr. Elmyra Ricks office.   No facility-administered encounter medications on file as of 03/24/2023.    Allergies (verified) Patient has no known allergies.   History: Past Medical History:  Diagnosis Date   ADD (attention deficit disorder)    adult- per psych   Adenomatous colon polyp    Allergy    Rag weed   Cancer (HCC)  basal cell skin cancer    Diverticulitis    Perforated sigmoid diverticulitis    Fall 02/2009   fx left hip and wrist   Glaucoma    LEFT eye   Heart murmur    Echo 2003 "thickened and somehow myomateous MV, mild MR"   HOH (hard of hearing)    Hydrocele 2016   Large, symptomatic left hydrocele, planned hydrocelectomy in 10/2015 with Dr. Retta Diones   Hyperlipidemia    on meds   Insomnia    per psych   Seasonal allergies    Wears glasses    Past Surgical History:  Procedure Laterality Date   COLON SURGERY  2009?   COLONOSCOPY  2017   JP-MAC-suprep(exc)-TA and benign lymphoid -recall 5 yrs    FRACTURE SURGERY     HIP SURGERY Left 2011   ORIF femur   HYDROCELE EXCISION Left 11/02/2015   Procedure: HYDROCELECTOMY ADULT;  Surgeon: Marcine Matar, MD;  Location: [facility];  Service: Urology;  Laterality: Left;   LAPAROSCOPIC SIGMOID COLECTOMY N/A 02/11/2013   Procedure: LAPAROSCOPIC SIGMOID COLECTOMY WITH MOBILIZATION SPLENIC FLEXURE;  Surgeon: Lodema Pilot, DO;  Location: [facility];  Service: General;  Laterality: N/A;   nose biopsy  2011   Dr Terri Piedra, no cancer   POLYPECTOMY  2017   TA and benign lymphoid   WISDOM TOOTH EXTRACTION     WRIST SURGERY Left 2011   Family History  Problem Relation Age of Onset   Breast cancer Mother    Heart failure Mother    Lung cancer Father        smoker   Diabetes Neg Hx    Hypertension Neg Hx    Colon cancer Neg Hx    Prostate cancer Neg Hx    Heart attack Neg Hx    Colon polyps Neg Hx    Esophageal cancer Neg Hx    Rectal cancer Neg Hx    Stomach cancer Neg Hx    Social History   Socioeconomic History   Marital status: Married    Spouse name: [name]   Number of children: 3   Years of education: Not on file   Highest education level: Not on file  Occupational History   Occupation: Photographer, Herbalist   Occupation: Retired, 06-2020  Tobacco Use   Smoking status: Never    Passive exposure: Never   Smokeless tobacco: Never  Vaping Use   Vaping Use: Never used  Substance and Sexual Activity   Alcohol use: Not Currently    Alcohol/week: 4.0 standard drinks of alcohol    Types: 4 Standard drinks or equivalent per week    Comment: MODERATION   Drug use: No   Sexual activity: Yes  Other Topics Concern   Not on file  Social History Narrative   3 adult children, 3 g-children   Household:  pt and wife      Social Determinants of Health   Financial Resource Strain: Low Risk  (03/23/2023)   Overall Financial Resource Strain (CARDIA)    Difficulty of Paying Living Expenses: Not hard at all  Food  Insecurity: No Food Insecurity (03/23/2023)   Hunger Vital Sign    Worried About Running Out of Food in the Last Year: Never true    Ran Out of Food in the Last Year: Never true  Transportation Needs: No Transportation Needs (03/23/2023)   PRAPARE - Administrator, Civil Service (Medical): No    Lack of Transportation (Non-Medical): No  Physical Activity: Sufficiently Active (03/23/2023)   Exercise Vital Sign    Days of Exercise per Week: 2 days    Minutes of Exercise per Session: 150+ min  Stress: No Stress Concern Present (03/23/2023)   [employer] of Occupational Health - Occupational Stress Questionnaire    Feeling of Stress : Not at all  Social Connections: Unknown (03/23/2023)   Social Connection and Isolation Panel [NHANES]    Frequency of Communication with Friends and Family: More than three times a week    Frequency of Social Gatherings with Friends and Family: More than three times a week    Attends Religious Services: Not on Marketing executive or Organizations: Yes    Attends Banker Meetings: 1 to 4 times per year    Marital Status: Married    Tobacco Counseling Counseling given: Not Answered   Clinical Intake:  Pre-visit preparation completed: Yes  Pain : No/denies pain  Nutritional Risks: None Diabetes: No  How often do you need to have someone help you when you read instructions, pamphlets, or other written materials from your doctor or pharmacy?: 1 - Never   Activities of Daily Living    03/23/2023    1:00 PM  In your present state of health, do you have any difficulty performing the following activities:  Hearing? 1  Comment wears hearing aids  Vision? 0  Difficulty concentrating or making decisions? 0  Walking or climbing stairs? 0  Dressing or bathing? 0  Doing errands, shopping? 0  Preparing Food and eating ? N  Using the Toilet? N  In the past six months, have you accidently leaked urine? N  Do you have  problems with loss of bowel control? N  Managing your Medications? N  Managing your Finances? N  Housekeeping or managing your Housekeeping? N    Patient Care Team: [name], MD as PCP - General [place], Maree Krabbe, MD as Consulting Physician (Pulmonary Disease) Marcine Matar, MD as Consulting Physician (Urology) Hilarie Fredrickson, MD as Consulting Physician (Gastroenterology)  Indicate any recent Medical Services you may have received from other than [facility] providers in the past year (date may be approximate).     Assessment:   This is a routine wellness examination for [place].  Hearing/Vision screen No results found.  Dietary issues and exercise activities discussed: Current Exercise Habits: The patient does not participate in regular exercise at present (plays golf twice a week), Exercise limited by: None identified   Goals Addressed   None    Depression Screen    03/24/2023    1:05 PM 01/27/2023    3:58 PM 06/27/2022    9:56 AM 03/21/2022    8:50 AM 06/27/2021    9:44 AM 09/19/2020    9:04 AM 10/12/2019    1:28 PM  PHQ 2/9 Scores  PHQ - 2 Score 0 0 0 0 0 0 0    Fall Risk    03/23/2023    1:00 PM 01/27/2023    3:57 PM 06/27/2022    9:56 AM 03/21/2022    8:52 AM 06/27/2021    9:44 AM  Fall Risk   Falls in the past year? 0 0 0 0 0  Number falls in past yr: 0 0 0 0 0  Injury with Fall? 0 0 0 0 0  Risk for fall due to : No Fall Risks   No Fall Risks   Follow up Falls evaluation completed Falls evaluation  completed Falls evaluation completed  Falls evaluation completed    FALL RISK PREVENTION PERTAINING TO THE HOME:  Any stairs in or around the home? No  Home free of loose throw rugs in walkways, pet beds, electrical cords, etc? Yes  Adequate lighting in your home to reduce risk of falls? Yes   ASSISTIVE DEVICES UTILIZED TO PREVENT FALLS:  Life alert? No  Use of a cane, walker or w/c? No  Grab bars in the bathroom? Yes  Shower chair or bench in shower?  Built in  seat Elevated toilet seat or a handicapped toilet?  Comfort height  TIMED UP AND GO:  Was the test performed?  No, audio visit .    Cognitive Function:        03/24/2023    1:09 PM 03/21/2022    8:53 AM  6CIT Screen  What Year? 0 points 0 points  What month? 0 points 0 points  What time? 0 points 0 points  Count back from 20 0 points 0 points  Months in reverse 0 points 0 points  Repeat phrase 0 points 0 points  Total Score 0 points 0 points    Immunizations Immunization History  Administered Date(s) Administered   Fluad Quad(high Dose 65+) 09/19/2020, 10/02/2021   Influenza Whole 12/07/2009   Influenza,inj,Quad PF,6+ Mos 12/22/2013, 07/08/2019   PFIZER Comirnaty(Gray Top)Covid-19 Tri-Sucrose Vaccine 03/30/2021   PFIZER(Purple Top)SARS-COV-2 Vaccination 01/26/2020, 02/16/2020, 08/29/2020   Pfizer Covid-19 Vaccine Bivalent Booster 57yrs & up 10/02/2021   Pneumococcal Conjugate-13 09/19/2020   Td 02/16/2009   Tdap 10/12/2019   Zoster Recombinat (Shingrix) 10/12/2019, 03/21/2020   Zoster, Live 01/03/2016    TDAP status: Up to date  Flu Vaccine status: Up to date  Pneumococcal vaccine status: Due, Education has been provided regarding the importance of this vaccine. Advised may receive this vaccine at local pharmacy or Health Dept. Aware to provide a copy of the vaccination record if obtained from local pharmacy or Health Dept. Verbalized acceptance and understanding.  Covid-19 vaccine status: Information provided on how to obtain vaccines.   Qualifies for Shingles Vaccine? Yes   Zostavax completed Yes   Shingrix Completed?: Yes  Screening Tests Health Maintenance  Topic Date Due   Pneumonia Vaccine 82+ Years old (2 of 2 - PPSV23 or PCV20) 09/19/2021   COVID-19 Vaccine (6 - 2023-24 season) 07/19/2022   Medicare Annual Wellness (AWV)  03/22/2023   INFLUENZA VACCINE  06/19/2023   COLONOSCOPY (Pts 45-24yrs Insurance coverage will need to be confirmed)  09/20/2026    DTaP/Tdap/Td (3 - Td or Tdap) 10/11/2029   Hepatitis C Screening  Completed   Zoster Vaccines- Shingrix  Completed   HPV VACCINES  Aged Out    Health Maintenance  Health Maintenance Due  Topic Date Due   Pneumonia Vaccine 66+ Years old (2 of 2 - PPSV23 or PCV20) 09/19/2021   COVID-19 Vaccine (6 - 2023-24 season) 07/19/2022   Medicare Annual Wellness (AWV)  03/22/2023    Colorectal cancer screening: Type of screening: Colonoscopy. Completed 09/20/21. Repeat every 5 years  Lung Cancer Screening: (Low Dose CT Chest recommended if Age 47-80 years, 30 pack-year currently smoking OR have quit w/in 15years.) does not qualify.   Additional Screening:  Hepatitis C Screening: does qualify; Completed 03/11/17  Vision Screening: Recommended annual ophthalmology exams for early detection of glaucoma and other disorders of the eye. Is the patient up to date with their annual eye exam?  Yes  Who is the provider or what is the  name of the office in which the patient attends annual eye exams? Dr. Sherrine Maples [facility]. If pt is not established with a provider, would they like to be referred to a provider to establish care? No .   Dental Screening: Recommended annual dental exams for proper oral hygiene  Community Resource Referral / Chronic Care Management: CRR required this visit?  No   CCM required this visit?  No      Plan:     I have personally reviewed and noted the following in the patient's chart:   Medical and social history Use of alcohol, tobacco or illicit drugs  Current medications and supplements including opioid prescriptions. Patient is not currently taking opioid prescriptions. Functional ability and status Nutritional status Physical activity Advanced directives List of other physicians Hospitalizations, surgeries, and ER visits in previous 12 months Vitals Screenings to include cognitive, depression, and falls Referrals and appointments  In addition, I have  reviewed and discussed with patient certain preventive protocols, quality metrics, and best practice recommendations. A written personalized care plan for preventive services as well as general preventive health recommendations were provided to patient.   Due to this being a telephonic visit, the after visit summary with patients personalized plan was offered to patient via mail or my-chart. Patient would like to access on my-chart.  Donne Anon, [place]   03/24/2023   Nurse Notes: None

## 2023-03-24 NOTE — Patient Instructions (Signed)
[name] , Thank you for taking time to come for your Medicare Wellness Visit. I appreciate your ongoing commitment to your health goals. Please review the following plan we discussed and let me know if I can assist you in the future.     This is a list of the screening recommended for you and due dates:  Health Maintenance  Topic Date Due   Pneumonia Vaccine (2 of 2 - PPSV23 or PCV20) 09/19/2021   COVID-19 Vaccine (6 - 2023-24 season) 07/19/2022   Flu Shot  06/19/2023   Medicare Annual Wellness Visit  03/23/2024   Colon Cancer Screening  09/20/2026   DTaP/Tdap/Td vaccine (3 - Td or Tdap) 10/11/2029   Hepatitis C Screening: USPSTF Recommendation to screen - Ages 41-79 yo.  Completed   Zoster (Shingles) Vaccine  Completed   HPV Vaccine  Aged Out    Next appointment: Follow up in one year for your annual wellness visit.   Preventive Care 31 Years and Older, Male Preventive care refers to lifestyle choices and visits with your health care provider that can promote health and wellness. What does preventive care include? A yearly physical exam. This is also called an annual well check. Dental exams once or twice a year. Routine eye exams. Ask your health care provider how often you should have your eyes checked. Personal lifestyle choices, including: Daily care of your teeth and gums. Regular physical activity. Eating a healthy diet. Avoiding tobacco and drug use. Limiting alcohol use. Practicing safe sex. Taking low doses of aspirin every day. Taking vitamin and mineral supplements as recommended by your health care provider. What happens during an annual well check? The services and screenings done by your health care provider during your annual well check will depend on your age, overall health, lifestyle risk factors, and family history of disease. Counseling  Your health care provider may ask you questions about your: Alcohol use. Tobacco use. Drug use. Emotional  well-being. Home and relationship well-being. Sexual activity. Eating habits. History of falls. Memory and ability to understand (cognition). Work and work Astronomer. Screening  You may have the following tests or measurements: Height, weight, and BMI. Blood pressure. Lipid and cholesterol levels. These may be checked every 5 years, or more frequently if you are over 64 years old. Skin check. Lung cancer screening. You may have this screening every year starting at age 50 if you have a 30-pack-year history of smoking and currently smoke or have quit within the past 15 years. Fecal occult blood test (FOBT) of the stool. You may have this test every year starting at age 68. Flexible sigmoidoscopy or colonoscopy. You may have a sigmoidoscopy every 5 years or a colonoscopy every 10 years starting at age 79. Prostate cancer screening. Recommendations will vary depending on your family history and other risks. Hepatitis C blood test. Hepatitis B blood test. Sexually transmitted disease (STD) testing. Diabetes screening. This is done by checking your blood sugar (glucose) after you have not eaten for a while (fasting). You may have this done every 1-3 years. Abdominal aortic aneurysm (AAA) screening. You may need this if you are a current or former smoker. Osteoporosis. You may be screened starting at age 63 if you are at high risk. Talk with your health care provider about your test results, treatment options, and if necessary, the need for more tests. Vaccines  Your health care provider may recommend certain vaccines, such as: Influenza vaccine. This is recommended every year. Tetanus, diphtheria, and  acellular pertussis (Tdap, Td) vaccine. You may need a Td booster every 10 years. Zoster vaccine. You may need this after age 68. Pneumococcal 13-valent conjugate (PCV13) vaccine. One dose is recommended after age 51. Pneumococcal polysaccharide (PPSV23) vaccine. One dose is recommended after  age 48. Talk to your health care provider about which screenings and vaccines you need and how often you need them. This information is not intended to replace advice given to you by your health care provider. Make sure you discuss any questions you have with your health care provider. Document Released: 12/01/2015 Document Revised: 07/24/2016 Document Reviewed: 09/05/2015 Elsevier Interactive Patient Education  2017 [place] Prevention in the Home Falls can cause injuries. They can happen to people of all ages. There are many things you can do to make your home safe and to help prevent falls. What can I do on the outside of my home? Regularly fix the edges of walkways and driveways and fix any cracks. Remove anything that might make you trip as you walk through a door, such as a raised step or threshold. Trim any bushes or trees on the path to your home. Use bright outdoor lighting. Clear any walking paths of anything that might make someone trip, such as rocks or tools. Regularly check to see if handrails are loose or broken. Make sure that both sides of any steps have handrails. Any raised decks and porches should have guardrails on the edges. Have any leaves, snow, or ice cleared regularly. Use sand or salt on walking paths during winter. Clean up any spills in your garage right away. This includes oil or grease spills. What can I do in the bathroom? Use night lights. Install grab bars by the toilet and in the tub and shower. Do not use towel bars as grab bars. Use non-skid mats or decals in the tub or shower. If you need to sit down in the shower, use a plastic, non-slip stool. Keep the floor dry. Clean up any water that spills on the floor as soon as it happens. Remove soap buildup in the tub or shower regularly. Attach bath mats securely with double-sided non-slip rug tape. Do not have throw rugs and other things on the floor that can make you trip. What can I do in the  bedroom? Use night lights. Make sure that you have a light by your bed that is easy to reach. Do not use any sheets or blankets that are too big for your bed. They should not hang down onto the floor. Have a firm chair that has side arms. You can use this for support while you get dressed. Do not have throw rugs and other things on the floor that can make you trip. What can I do in the kitchen? Clean up any spills right away. Avoid walking on wet floors. Keep items that you use a lot in easy-to-reach places. If you need to reach something above you, use a strong step stool that has a grab bar. Keep electrical cords out of the way. Do not use floor polish or wax that makes floors slippery. If you must use wax, use non-skid floor wax. Do not have throw rugs and other things on the floor that can make you trip. What can I do with my stairs? Do not leave any items on the stairs. Make sure that there are handrails on both sides of the stairs and use them. Fix handrails that are broken or loose. Make sure that  handrails are as long as the stairways. Check any carpeting to make sure that it is firmly attached to the stairs. Fix any carpet that is loose or worn. Avoid having throw rugs at the top or bottom of the stairs. If you do have throw rugs, attach them to the floor with carpet tape. Make sure that you have a light switch at the top of the stairs and the bottom of the stairs. If you do not have them, ask someone to add them for you. What else can I do to help prevent falls? Wear shoes that: Do not have high heels. Have rubber bottoms. Are comfortable and fit you well. Are closed at the toe. Do not wear sandals. If you use a stepladder: Make sure that it is fully opened. Do not climb a closed stepladder. Make sure that both sides of the stepladder are locked into place. Ask someone to hold it for you, if possible. Clearly mark and make sure that you can see: Any grab bars or  handrails. First and last steps. Where the edge of each step is. Use tools that help you move around (mobility aids) if they are needed. These include: Canes. Walkers. Scooters. Crutches. Turn on the lights when you go into a dark area. Replace any light bulbs as soon as they burn out. Set up your furniture so you have a clear path. Avoid moving your furniture around. If any of your floors are uneven, fix them. If there are any pets around you, be aware of where they are. Review your medicines with your doctor. Some medicines can make you feel dizzy. This can increase your chance of falling. Ask your doctor what other things that you can do to help prevent falls. This information is not intended to replace advice given to you by your health care provider. Make sure you discuss any questions you have with your health care provider. Document Released: 08/31/2009 Document Revised: 04/11/2016 Document Reviewed: 12/09/2014 Elsevier Interactive Patient Education  2017 [employer].

## 2023-04-07 ENCOUNTER — Other Ambulatory Visit: Payer: Self-pay | Admitting: Internal Medicine

## 2023-04-24 ENCOUNTER — Telehealth: Payer: Self-pay | Admitting: Internal Medicine

## 2023-04-24 MED ORDER — AMPHETAMINE-DEXTROAMPHET ER 30 MG PO CP24: 30.0000 mg | ORAL_CAPSULE | ORAL | 0 refills | Status: DC

## 2023-04-24 NOTE — Telephone Encounter (Signed)
Patient has called requesting a refill  amphetamine-dextroamphetamine (ADDERALL XR) 30 MG 24 hr capsule   Pharmacy: [id number] - [place],  - [address]

## 2023-04-24 NOTE — Telephone Encounter (Signed)
Pdmp ok, rx sent  ? ?

## 2023-04-24 NOTE — Telephone Encounter (Signed)
Requesting: Adderall XR 30MG   Contract: 01/27/23 UDS: 01/27/23 Last Visit: 01/27/23 Next Visit: None Last Refill: 03/14/23 #30 and 0RF   Please Advise

## 2023-05-15 ENCOUNTER — Other Ambulatory Visit: Payer: Self-pay | Admitting: Internal Medicine

## 2023-05-30 ENCOUNTER — Telehealth: Payer: Self-pay | Admitting: Internal Medicine

## 2023-05-30 NOTE — Addendum Note (Signed)
Addended bySilvio Pate on: 05/30/2023 10:18 AM   Modules accepted: Orders

## 2023-05-30 NOTE — Telephone Encounter (Signed)
Prescription Request  05/30/2023  Is this a "Controlled Substance" medicine? Yes  LOV: 01/27/2023  What is the name of the medication or equipment? amphetamine-dextroamphetamine (ADDERALL XR) 30 MG 24 hr capsule [[id number]]   Have you contacted your pharmacy to request a refill? No   Which pharmacy would you like this sent to?   [id number] [place], [place] - [address] [address] Phone: [phone] Fax: [phone]    Patient notified that their request is being sent to the clinical staff for review and that they should receive a response within 2 business days.   Please advise at [facility] [phone]

## 2023-05-30 NOTE — Telephone Encounter (Signed)
Requesting: Adderall XR 30MG  take 1 tablet po qd Contract: 01/27/23 UDS: 01/27/23 Last Visit: 01/27/23 Next Visit: None Last Refill:  04/24/2023 #30 no refills   Please Advise

## 2023-06-02 MED ORDER — AMPHETAMINE-DEXTROAMPHET ER 30 MG PO CP24: 30.0000 mg | ORAL_CAPSULE | ORAL | 0 refills | Status: DC

## 2023-06-02 NOTE — Telephone Encounter (Signed)
PMP review, prescription sent. Next visit should be by 06-2023

## 2023-06-02 NOTE — Addendum Note (Signed)
Addended by: Willow Ora E on: 06/02/2023 11:17 AM   Modules accepted: Orders

## 2023-07-02 ENCOUNTER — Telehealth: Payer: Self-pay | Admitting: Internal Medicine

## 2023-07-02 NOTE — Telephone Encounter (Signed)
Prescription Request  07/02/2023  Is this a "Controlled Substance" medicine? Yes  LOV: 01/27/2023  What is the name of the medication or equipment?   amphetamine-dextroamphetamine (ADDERALL XR) 30 MG 24 hr capsule [[id number]]  Have you contacted your pharmacy to request a refill? No   Which pharmacy would you like this sent to?   [id number] [place], [place] - [address] [place] [address] Phone: [phone] Fax: [phone]    Patient notified that their request is being sent to the clinical staff for review and that they should receive a response within 2 business days.   Please advise at Mobile [phone] (mobile)

## 2023-07-03 ENCOUNTER — Other Ambulatory Visit: Payer: Self-pay | Admitting: *Deleted

## 2023-07-03 ENCOUNTER — Telehealth: Payer: Self-pay | Admitting: Internal Medicine

## 2023-07-03 MED ORDER — TRAZODONE HCL 100 MG PO TABS: ORAL_TABLET | ORAL | 3 refills | Status: DC

## 2023-07-03 MED ORDER — AMPHETAMINE-DEXTROAMPHETAMINE 10 MG PO TABS: 10.0000 mg | ORAL_TABLET | Freq: Every day | ORAL | 0 refills | Status: DC | PRN

## 2023-07-03 NOTE — Telephone Encounter (Signed)
Pt requesting refill on Trazodone. [facility].

## 2023-07-03 NOTE — Telephone Encounter (Signed)
Refill sent to pharmacy.   

## 2023-07-03 NOTE — Telephone Encounter (Signed)
Pdmp ok, Rx sent

## 2023-07-03 NOTE — Addendum Note (Signed)
Addended by: Willow Ora E on: 07/03/2023 11:00 AM   Modules accepted: Orders

## 2023-08-04 ENCOUNTER — Other Ambulatory Visit: Payer: Self-pay | Admitting: Internal Medicine

## 2023-08-04 ENCOUNTER — Telehealth: Payer: Self-pay | Admitting: Internal Medicine

## 2023-08-04 MED ORDER — AMPHETAMINE-DEXTROAMPHETAMINE 10 MG PO TABS: 10.0000 mg | ORAL_TABLET | Freq: Every day | ORAL | 0 refills | Status: DC | PRN

## 2023-08-04 NOTE — Telephone Encounter (Signed)
PDMP okay, Rx sent

## 2023-08-04 NOTE — Telephone Encounter (Signed)
Patient called and would like a med refill on amphetamine-dextroamphetamine (ADDERALL) 10 MG tablet . Please send to [facility] in [place] on [address]

## 2023-08-04 NOTE — Telephone Encounter (Signed)
Requesting:Adderall 10mg   Contract: 01/27/23 UDS: 01/27/23 Last Visit: 01/27/23 Next Visit: None Last Refill: 07/03/23 #30 and 0RF   Please Advise

## 2023-08-25 DIAGNOSIS — I77811 Abdominal aortic ectasia: Secondary | ICD-10-CM

## 2023-09-04 ENCOUNTER — Ambulatory Visit (HOSPITAL_COMMUNITY)
Admission: RE | Admit: 2023-09-04 | Discharge: 2023-09-04 | Disposition: A | Discharge status: Home / Self Care | Payer: Medicare Other | Source: Ambulatory Visit | Attending: Internal Medicine | Admitting: Internal Medicine

## 2023-09-04 DIAGNOSIS — I77811 Abdominal aortic ectasia: Secondary | ICD-10-CM | POA: Diagnosis present

## 2023-09-08 ENCOUNTER — Other Ambulatory Visit: Payer: Self-pay | Admitting: Internal Medicine

## 2023-09-08 ENCOUNTER — Telehealth: Payer: Self-pay | Admitting: Internal Medicine

## 2023-09-08 MED ORDER — AMPHETAMINE-DEXTROAMPHET ER 30 MG PO CP24: 30.0000 mg | ORAL_CAPSULE | ORAL | 0 refills | Status: DC

## 2023-09-08 NOTE — Addendum Note (Signed)
Addended byConrad [place] D on: 09/08/2023 01:19 PM   Modules accepted: Orders

## 2023-09-08 NOTE — Telephone Encounter (Signed)
Pt requesting refill on his Sildanfil and the time release of his generic adderall. Pt said the last two times he has gotten 10 mg which is not correct. He wants the time release. Please send to [facility] in [place]

## 2023-09-08 NOTE — Telephone Encounter (Signed)
Pt was due for an appt back in August, will need appt for refills.

## 2023-09-08 NOTE — Telephone Encounter (Signed)
PDMP okay, Rx sent 

## 2023-09-08 NOTE — Telephone Encounter (Signed)
Requesting: Adderall XR 30mg   Contract:01/27/23 UDS:01/27/23 Last Visit: 01/27/23 Next Visit: 09/22/23 Last Refill: 06/02/23 #30 and 0RF   Please Advise

## 2023-09-08 NOTE — Addendum Note (Signed)
Addended by: Willow Ora E on: 09/08/2023 04:08 PM   Modules accepted: Orders

## 2023-09-22 ENCOUNTER — Encounter: Payer: Self-pay | Admitting: Internal Medicine

## 2023-09-22 ENCOUNTER — Ambulatory Visit (INDEPENDENT_AMBULATORY_CARE_PROVIDER_SITE_OTHER): Payer: Medicare Other | Admitting: Internal Medicine

## 2023-09-22 VITALS — BP 132/80 | HR 85 | Temp 98.4°F | Resp 16 | Ht 71.0 in | Wt 184.4 lb

## 2023-09-22 DIAGNOSIS — G4700 Insomnia, unspecified: Secondary | ICD-10-CM

## 2023-09-22 DIAGNOSIS — N529 Male erectile dysfunction, unspecified: Secondary | ICD-10-CM

## 2023-09-22 DIAGNOSIS — E785 Hyperlipidemia, unspecified: Secondary | ICD-10-CM | POA: Diagnosis not present

## 2023-09-22 DIAGNOSIS — Z125 Encounter for screening for malignant neoplasm of prostate: Secondary | ICD-10-CM

## 2023-09-22 DIAGNOSIS — F988 Other specified behavioral and emotional disorders with onset usually occurring in childhood and adolescence: Secondary | ICD-10-CM | POA: Diagnosis not present

## 2023-09-22 LAB — COMPREHENSIVE METABOLIC PANEL
ALT: 22 U/L (ref 0–53)
AST: 27 U/L (ref 0–37)
Albumin: 4.2 g/dL (ref 3.5–5.2)
Alkaline Phosphatase: 65 U/L (ref 39–117)
BUN: 18 mg/dL (ref 6–23)
CO2: 27 meq/L (ref 19–32)
Calcium: 9.4 mg/dL (ref 8.4–10.5)
Chloride: 105 meq/L (ref 96–112)
Creatinine, Ser: 0.94 mg/dL (ref 0.40–1.50)
GFR: 83.47 mL/min (ref >60.00)
Glucose, Bld: 94 mg/dL (ref 70–99)
Potassium: 4.1 meq/L (ref 3.5–5.1)
Sodium: 140 meq/L (ref 135–145)
Total Bilirubin: 0.8 mg/dL (ref 0.2–1.2)
Total Protein: 6.9 g/dL (ref 6.0–8.3)

## 2023-09-22 LAB — LIPID PANEL
Cholesterol: 170 mg/dL (ref 0–200)
HDL: 84.6 mg/dL (ref >39.00)
LDL Cholesterol: 77 mg/dL (ref 0–99)
NonHDL: 85.75
Total CHOL/HDL Ratio: 2
Triglycerides: 42 mg/dL (ref 0.0–149.0)
VLDL: 8.4 mg/dL (ref 0.0–40.0)

## 2023-09-22 LAB — CBC WITH DIFFERENTIAL/PLATELET
Basophils Absolute: 0 10*3/uL (ref 0.0–0.1)
Basophils Relative: 0.8 % (ref 0.0–3.0)
Eosinophils Absolute: 0.1 10*3/uL (ref 0.0–0.7)
Eosinophils Relative: 1.1 % (ref 0.0–5.0)
HCT: 45.3 % (ref 39.0–52.0)
Hemoglobin: 14.7 g/dL (ref 13.0–17.0)
Lymphocytes Relative: 30.4 % (ref 12.0–46.0)
Lymphs Abs: 1.9 10*3/uL (ref 0.7–4.0)
MCHC: 32.4 g/dL (ref 30.0–36.0)
MCV: 97.4 fL (ref 78.0–100.0)
Monocytes Absolute: 0.6 10*3/uL (ref 0.1–1.0)
Monocytes Relative: 10.4 % (ref 3.0–12.0)
Neutro Abs: 3.6 10*3/uL (ref 1.4–7.7)
Neutrophils Relative %: 57.3 % (ref 43.0–77.0)
Platelets: 247 10*3/uL (ref 150.0–400.0)
RBC: 4.65 Mil/uL (ref 4.22–5.81)
RDW: 13.3 % (ref 11.5–15.5)
WBC: 6.3 10*3/uL (ref 4.0–10.5)

## 2023-09-22 LAB — PSA: PSA: 1.63 ng/mL (ref 0.10–4.00)

## 2023-09-22 MED ORDER — SILDENAFIL CITRATE 20 MG PO TABS: ORAL_TABLET | ORAL | 3 refills | Status: DC

## 2023-09-22 NOTE — Patient Instructions (Addendum)
Vaccines I recommend: Covid booster RSV vaccine Flu shot     GO TO THE LAB : Get the blood work     Next visit with me in 6 months for checkup Please schedule it at the front desk        "Health Care Power of attorney" ,  "Living will" (Advance care planning documents)  If you already have a living will or healthcare power of attorney, is recommended you bring the copy to be scanned in your chart.   The document will be available to all the doctors you see in the system.  Advance care planning is a process that supports adults in  understanding and sharing their preferences regarding future medical care.  The patient's preferences are recorded in documents called Advance Directives and the can be modified at any time while the patient is in full mental capacity.   If you don't have one, please consider create one.      More information at: [email or web address]

## 2023-09-22 NOTE — Assessment & Plan Note (Signed)
Dyslipidemia: On atorvastatin, checking labs. ADD, insomnia: On Adderall, trazodone, things are well-controlled.  RF as needed. ED: Sildenafil RF  sent Preventive care reviewed: RTC 6 months

## 2023-09-22 NOTE — Progress Notes (Signed)
Subjective:    Patient ID: [name], male    DOB: [birth year], 67 y.o.   MRN: [id number]  DOS:  09/22/2023 Type of visit - description: Routine checkup  In general feeling well. Chronic medical problems addressed. Screenings discussed with patient.  Review of Systems Denies chest pain or difficulty breathing, no GI or GU symptoms  Past Medical History:  Diagnosis Date   ADD (attention deficit disorder)    adult- per psych   Adenomatous colon polyp    Allergy    Rag weed   Cancer (HCC)    basal cell skin cancer    Diverticulitis    Perforated sigmoid diverticulitis    Fall 02/2009   fx left hip and wrist   Glaucoma    LEFT eye   Heart murmur    Echo 2003 "thickened and somehow myomateous MV, mild MR"   HOH (hard of hearing)    Hydrocele 2016   Large, symptomatic left hydrocele, planned hydrocelectomy in 10/2015 with Dr. Retta Diones   Hyperlipidemia    on meds   Insomnia    per psych   Seasonal allergies    Wears glasses     Past Surgical History:  Procedure Laterality Date   COLON SURGERY  2009?   COLONOSCOPY  2017   JP-MAC-suprep(exc)-TA and benign lymphoid -recall 5 yrs   FRACTURE SURGERY     HIP SURGERY Left 2011   ORIF femur   HYDROCELE EXCISION Left 11/02/2015   Procedure: HYDROCELECTOMY ADULT;  Surgeon: Marcine Matar, MD;  Location: [facility];  Service: Urology;  Laterality: Left;   LAPAROSCOPIC SIGMOID COLECTOMY N/A 02/11/2013   Procedure: LAPAROSCOPIC SIGMOID COLECTOMY WITH MOBILIZATION SPLENIC FLEXURE;  Surgeon: Lodema Pilot, DO;  Location: [facility];  Service: General;  Laterality: N/A;   nose biopsy  2011   Dr Terri Piedra, no cancer   POLYPECTOMY  2017   TA and benign lymphoid   WISDOM TOOTH EXTRACTION     WRIST SURGERY Left 2011    Current Outpatient Medications  Medication Instructions   amphetamine-dextroamphetamine (ADDERALL XR) 30 MG 24 hr capsule 30 mg, Oral, BH-each morning   amphetamine-dextroamphetamine (ADDERALL) 10 MG  tablet 10 mg, Oral, Daily PRN   atorvastatin (LIPITOR) 20 mg, Oral, Daily at bedtime, Due for visit in 06/2023   clindamycin (CLINDAGEL) 1 % gel 1 Application, Topical, Daily PRN   diclofenac sodium (VOLTAREN) 4 g, Topical, 4 times daily PRN   montelukast (SINGULAIR) 10 mg, Oral, Daily at bedtime   Multiple Vitamins-Minerals (CENTRUM ULTRA MENS) TABS 1 tablet, Oral, 3 times weekly, -Monday, Wednesday, Friday   Olopatadine-Mometasone (RYALTRIS) 696-29 MCG/ACT SUSP 1-2 sprays, Nasal, 2 times daily   sildenafil (REVATIO) 20 MG tablet TAKE 3 TO 4 TABLETS BY MOUTH AT BEDTIME AS NEEDED   traZODone (DESYREL) 100 MG tablet TAKE 1/2 TO 1 TABLET(50 TO 100 MG) BY MOUTH AT BEDTIME AS NEEDED FOR SLEEP       Objective:   Physical Exam BP 132/80   Pulse 85   Temp 98.4 F (36.9 C) (Oral)   Resp 16   Ht 5\' 11"  (1.803 m)   Wt 184 lb 6 oz (83.6 kg)   SpO2 95%   BMI 25.72 kg/m  General: Well developed, NAD, BMI noted Neck: No  thyromegaly  HEENT:  Normocephalic . Face symmetric, atraumatic Lungs:  CTA B Normal respiratory effort, no intercostal retractions, no accessory muscle use. Heart: RRR,  no murmur.  Abdomen:  Not distended, soft, non-tender. No  rebound or rigidity.   Lower extremities: no pretibial edema bilaterally  Skin: Exposed areas without rash. Not pale. Not jaundice Neurologic:  alert & oriented X3.  Speech normal, gait appropriate for age and unassisted Strength symmetric and appropriate for age.  Psych: Cognition and judgment appear intact.  Cooperative with normal attention span and concentration.  Behavior appropriate. No anxious or depressed appearing.     Assessment    ASSESSMENT Dyslipidemia  ADD,  Insomnia per  [facility] Counseling >>> Rx transferred to PCP 09-2019 Snoring palpable aorta: CT 2014 no AAA H/o diverticulitis, perforated, sigmoid colectomy 2014 H/o Hydrocele 2016, large, symptomatic, saw urology, excised H/o BCC, sees derm Barbae  folliculitis, previously dermatology prescribed Ziana, now on Flagyl as needed H/o  heart murmur, echo 2013 --thick MV, mild MR   PLAN Dyslipidemia: On atorvastatin, checking labs. ADD, insomnia: On Adderall, trazodone, things are well-controlled.  RF as needed. ED: Sildenafil RF  sent Preventive care reviewed: -Tdap 2020 - zostavax --12-2015; s/p shingrex   - PNM 23: 2021 - Rec; covid vax ; flu shot and RSV; pros>> cons  --CCs: *cscope 05/2013--Dr Perry--multiple adenomas *cscope again 05-2016, 5 years, 09-2021, next per GI --Prostate ca screening : No symptoms,  check PSA. -- POA discussed RTC 6 months

## 2023-09-22 NOTE — Assessment & Plan Note (Signed)
Preventive care reviewed: -Tdap 2020 - zostavax --12-2015; s/p shingrex   - PNM 23: 2021 - Rec; covid vax ; flu shot and RSV; pros>> cons  --CCs: *cscope 05/2013--Dr Perry--multiple adenomas *cscope again 05-2016, 5 years, 09-2021, next per GI --Prostate ca screening : No symptoms,  check PSA. -- POA discussed

## 2023-10-08 ENCOUNTER — Other Ambulatory Visit: Payer: Self-pay | Admitting: Internal Medicine

## 2023-10-09 ENCOUNTER — Telehealth: Payer: Self-pay | Admitting: Internal Medicine

## 2023-10-09 MED ORDER — SILDENAFIL CITRATE 20 MG PO TABS: ORAL_TABLET | ORAL | 3 refills | Status: DC

## 2023-10-09 NOTE — Telephone Encounter (Signed)
Pt called stating that he needed to have his sildenafil refilled. Per chart review, it looked like a script had been sent in with refills on 11.4.24. Pt stated that his pharmacy didn't seem to have that on file. Advised a note would be sent back to look into this.

## 2023-10-09 NOTE — Addendum Note (Signed)
Addended byConrad [place] D on: 10/09/2023 12:54 PM   Modules accepted: Orders

## 2023-10-09 NOTE — Telephone Encounter (Signed)
Rx resent.

## 2023-10-14 ENCOUNTER — Telehealth: Payer: Self-pay | Admitting: Internal Medicine

## 2023-10-14 MED ORDER — ATORVASTATIN CALCIUM 20 MG PO TABS: 20.0000 mg | ORAL_TABLET | Freq: Every day | ORAL | 1 refills | Status: DC

## 2023-10-14 NOTE — Telephone Encounter (Signed)
Prescription Request  10/14/2023  Is this a "Controlled Substance" medicine? NO   LOV: 09/22/2023  What is the name of the medication or equipment?   atorvastatin (LIPITOR) 20 MG tablet  Have you contacted your pharmacy to request a refill? No   Which pharmacy would you like this sent to?  [id number] - [place], [place] - [address] AT [facility] [address] [place] [address] Phone: [phone] Fax: [phone]     Patient notified that their request is being sent to the clinical staff for review and that they should receive a response within 2 business days.   Please advise at Mobile [phone] (mobile)

## 2023-10-14 NOTE — Addendum Note (Signed)
Addended byConrad [place] D on: 10/14/2023 04:24 PM   Modules accepted: Orders

## 2023-10-14 NOTE — Addendum Note (Signed)
Addended byConrad [place] D on: 10/14/2023 04:35 PM   Modules accepted: Orders

## 2023-10-14 NOTE — Telephone Encounter (Signed)
Requesting: Adderall XR 30mg  and Adderall 10mg   Contract: 01/27/23 UDS: 01/27/23 Last Visit: 09/22/23 Next Visit:03/22/24 Last Refill: see med list, multiple controlled substances requested   Please Advise

## 2023-10-14 NOTE — Telephone Encounter (Signed)
Pt also needs his amphetamine-dextroamphetamine (ADDERALL XR) 30 MG 24 hr capsule refilled as well

## 2023-10-14 NOTE — Telephone Encounter (Signed)
Rx sent 

## 2023-10-15 MED ORDER — AMPHETAMINE-DEXTROAMPHETAMINE 10 MG PO TABS: 10.0000 mg | ORAL_TABLET | Freq: Every day | ORAL | 0 refills | Status: DC | PRN

## 2023-10-15 MED ORDER — AMPHETAMINE-DEXTROAMPHET ER 30 MG PO CP24: 30.0000 mg | ORAL_CAPSULE | ORAL | 0 refills | Status: DC

## 2023-10-15 NOTE — Telephone Encounter (Signed)
PDMP okay, Rx sent x2

## 2023-10-15 NOTE — Addendum Note (Signed)
Addended by: Willow Ora E on: 10/15/2023 09:30 AM   Modules accepted: Orders

## 2023-11-06 ENCOUNTER — Other Ambulatory Visit: Payer: Self-pay | Admitting: Internal Medicine

## 2023-11-14 ENCOUNTER — Telehealth: Payer: Self-pay | Admitting: Internal Medicine

## 2023-11-14 NOTE — Telephone Encounter (Signed)
Requesting: Adderall 30 mg XR Contract: 01/27/2023 UDS: 01/27/2023 Last Visit: 09/22/2023 Next Visit: 03/22/2024 Last Refill: 10/15/2023 #30 no refills  Please Advise

## 2023-11-14 NOTE — Telephone Encounter (Unsigned)
Copied from CRM [id number]. Topic: Clinical - Medication Refill >> Nov 14, 2023 11:18 AM Marica Otter wrote: Most Recent Primary Care Visit:  Provider: Willow Ora E  Department: [facility]  Visit Type: OFFICE VISIT  Date: 09/22/2023  Medication: amphetamine-dextroamphetamine (ADDERALL XR) 30 MG 24 hr capsule  Has the patient contacted their pharmacy? No, needs provider authorization control substance (Agent: If no, request that the patient contact the pharmacy for the refill. If patient does not wish to contact the pharmacy document the reason why and proceed with request.) (Agent: If yes, when and what did the pharmacy advise?)  Is this the correct pharmacy for this prescription? Yes If no, delete pharmacy and type the correct one.  This is the patient's preferred pharmacy:  [id number], [place] - [address] AT [facility] [address] [place] [address] Phone: [phone] Fax: [phone]    Has the prescription been filled recently? Yes 09/2023  Is the patient out of the medication? Yes  Has the patient been seen for an appointment in the last year OR does the patient have an upcoming appointment? Yes  Can we respond through MyChart? Yes  Agent: Please be advised that Rx refills may take up to 3 business days. We ask that you follow-up with your pharmacy.

## 2023-11-17 MED ORDER — AMPHETAMINE-DEXTROAMPHET ER 30 MG PO CP24: 30.0000 mg | ORAL_CAPSULE | ORAL | 0 refills | Status: DC

## 2023-11-17 NOTE — Telephone Encounter (Signed)
PDMP okay, Rx sent 

## 2024-01-07 ENCOUNTER — Other Ambulatory Visit: Payer: Self-pay | Admitting: Internal Medicine

## 2024-02-05 ENCOUNTER — Telehealth: Payer: Self-pay | Admitting: Internal Medicine

## 2024-02-05 NOTE — Telephone Encounter (Signed)
 Copied from CRM [id number]. Topic: Clinical - Medication Refill >> Feb 05, 2024  9:14 AM Florestine Avers wrote: Most Recent Primary Care Visit:  Provider: Willow Ora E  Department: [facility]  Visit Type: OFFICE VISIT  Date: 09/22/2023  Medication: amphetamine-dextroamphetamine (ADDERALL XR) 30 MG 24 hr capsule, amphetamine-dextroamphetamine (ADDERALL) 10 MG tablet  Has the patient contacted their pharmacy? Yes (Agent: If no, request that the patient contact the pharmacy for the refill. If patient does not wish to contact the pharmacy document the reason why and proceed with request.) (Agent: If yes, when and what did the pharmacy advise?)  Is this the correct pharmacy for this prescription? Yes If no, delete pharmacy and type the correct one.  This is the patient's preferred pharmacy:  [facility] #[id number] [facility], [place] - [address] AT [facility] [address] [place] [address] Phone: [phone] Fax: [phone]   Has the prescription been filled recently? No  Is the patient out of the medication? Yes  Has the patient been seen for an appointment in the last year OR does the patient have an upcoming appointment? Yes  Can we respond through MyChart? Yes  Agent: Please be advised that Rx refills may take up to 3 business days. We ask that you follow-up with your pharmacy.

## 2024-02-05 NOTE — Telephone Encounter (Signed)
 Requesting: Adderall XR 30 mg Contract: 01/27/2023 UDS: 01/27/2023 Last Visit: 09/22/2023 Next Visit: 03/22/2024 Last Refill: 11/17/2023  Please Advise    Requesting: Adderall 10 mg Contract: 01/27/2023 UDS: 01/27/2023 Last Visit: 09/22/2023 Next Visit: 03/22/2024 Last Refill: 10/15/2023  Please Advise

## 2024-02-06 MED ORDER — AMPHETAMINE-DEXTROAMPHETAMINE 10 MG PO TABS: 10.0000 mg | ORAL_TABLET | Freq: Every day | ORAL | 0 refills | Status: DC | PRN

## 2024-02-06 MED ORDER — AMPHETAMINE-DEXTROAMPHET ER 30 MG PO CP24: 30.0000 mg | ORAL_CAPSULE | ORAL | 0 refills | Status: DC

## 2024-02-06 NOTE — Telephone Encounter (Signed)
 PDMP okay, Rx sent

## 2024-03-02 ENCOUNTER — Ambulatory Visit: Admitting: Family

## 2024-03-02 ENCOUNTER — Encounter: Payer: Self-pay | Admitting: Family

## 2024-03-02 ENCOUNTER — Other Ambulatory Visit: Payer: Self-pay

## 2024-03-02 VITALS — BP 130/88 | HR 91 | Temp 98.0°F | Ht 73.0 in | Wt 184.0 lb

## 2024-03-02 DIAGNOSIS — J302 Other seasonal allergic rhinitis: Secondary | ICD-10-CM | POA: Diagnosis not present

## 2024-03-02 DIAGNOSIS — H1013 Acute atopic conjunctivitis, bilateral: Secondary | ICD-10-CM

## 2024-03-02 MED ORDER — RYALTRIS 665-25 MCG/ACT NA SUSP: 1.0000 | Freq: Two times a day (BID) | NASAL | 11 refills | Status: AC

## 2024-03-02 NOTE — Patient Instructions (Addendum)
 Allergic rhinitis-moderately controlled 2023 skin testing showed: Positive to ragweed.  Continue environmental control measures as below. May use over the counter antihistamines such as Zyrtec (cetirizine), Claritin (loratadine), Allegra (fexofenadine), or Xyzal (levocetirizine) daily as needed. May switch antihistamines every few months. Stop Singulair (montelukast). Doing well enough without use May use Ryaltris (olopatadine + mometasone nasal spray combination) 1-2 sprays per nostril twice a day.  Nasal saline spray (i.e., Simply Saline) or nasal saline lavage (i.e., NeilMed) is recommended as needed and prior to medicated nasal sprays. May use olopatadine eye drops 0.2% once a day as needed for itchy/watery eyes.  Follow up in 12 months or sooner if needed.  Reducing Pollen Exposure Pollen seasons: trees (spring), grass (summer) and ragweed/weeds (fall). Keep windows closed in your home and car to lower pollen exposure.  Install air conditioning in the bedroom and throughout the house if possible.  Avoid going out in dry windy days - especially early morning. Pollen counts are highest between 5 - 10 AM and on dry, hot and windy days.  Save outside activities for late afternoon or after a heavy rain, when pollen levels are lower.  Avoid mowing of grass if you have grass pollen allergy. Be aware that pollen can also be transported indoors on people and pets.  Dry your clothes in an automatic dryer rather than hanging them outside where they might collect pollen.  Rinse hair and eyes before bedtime.

## 2024-03-02 NOTE — Progress Notes (Signed)
 [address] [place] [address] Dept: [phone]  FOLLOW UP NOTE  Patient ID: [name], male    DOB: 13-Jun-1956  Age: 68 y.o. MRN: [id number] Date of Office Visit: 03/02/2024  Assessment  Chief Complaint: Follow-up and Medication Refill  HPI [name] is a 68 year old male who presents today for follow-up of allergic rhinitis and allergic conjunctivitis.  He was last seen on February 21, 2023 by Dr. Selena Batten.  He denies any new diagnosis or surgery since his last office visit.  Allergic rhinitis: He reports since using Ryaltris nasal spray that his allergy symptoms have been kept in check.  Every now and then in the spring or during ragweed season he will have a little bit of sinus pressure, postnasal drip causing the voice changes, and sneezing.  He will use the Ryaltris nasal spray and it will help.  He denies rhinorrhea nasal congestion.  He has not been treated for any sinus infections since we last saw him.  He is currently not taking Singulair 10 mg anymore.  He stopped taking this medication after having acute symptoms.  He is also not taking any antihistamines.  He also did not ever start Ragwitek because he did not feel like his symptoms were bad enough to start.  He has tried saline rinses in the past and reports that they did not make much of a difference.  Allergic conjunctivitis: He reports itchy  eyes at times, but is not bad enough to where he needs to use an eyedrop.  He will just use a warm wash rag.   Drug Allergies:  No Known Allergies  Review of Systems: Negative except as per HPI   Physical Exam: BP 130/88   Pulse 91   Temp 98 F (36.7 C)   Ht 6\' 1"  (1.854 m)   Wt 184 lb (83.5 kg)   SpO2 96%   BMI 24.28 kg/m    Physical Exam Constitutional:      Appearance: Normal appearance.  HENT:     Head: Normocephalic and atraumatic.     Comments: Pharynx normal, eyes normal, ears normal, nose: Bilateral lower turbinates mildly edematous with no drainage noted     Right Ear: Tympanic membrane, ear canal and external ear normal.     Left Ear: Tympanic membrane, ear canal and external ear normal.     Mouth/Throat:     Mouth: Mucous membranes are moist.     Pharynx: Oropharynx is clear.  Eyes:     Conjunctiva/sclera: Conjunctivae normal.  Cardiovascular:     Rate and Rhythm: Regular rhythm.     Heart sounds: Normal heart sounds.  Pulmonary:     Effort: Pulmonary effort is normal.     Breath sounds: Normal breath sounds.     Comments: Lungs clear to auscultation Musculoskeletal:     Cervical back: Neck supple.  Skin:    General: Skin is warm.  Neurological:     Mental Status: He is alert and oriented to person, place, and time.  Psychiatric:        Mood and Affect: Mood normal.        Behavior: Behavior normal.        Thought Content: Thought content normal.        Judgment: Judgment normal.     Diagnostics:  None  Assessment and Plan: 1. Other seasonal allergic rhinitis   2. Allergic conjunctivitis of both eyes     No orders of the defined types were  placed in this encounter.   Patient Instructions  Allergic rhinitis-moderately controlled 2023 skin testing showed: Positive to ragweed.  Continue environmental control measures as below. May use over the counter antihistamines such as Zyrtec (cetirizine), Claritin (loratadine), Allegra (fexofenadine), or Xyzal (levocetirizine) daily as needed. May switch antihistamines every few months. Stop Singulair (montelukast). Doing well enough without use May use Ryaltris (olopatadine + mometasone nasal spray combination) 1-2 sprays per nostril twice a day.  Nasal saline spray (i.e., Simply Saline) or nasal saline lavage (i.e., NeilMed) is recommended as needed and prior to medicated nasal sprays. May use olopatadine eye drops 0.2% once a day as needed for itchy/watery eyes.  Follow up in 12 months or sooner if needed.  Reducing Pollen Exposure Pollen seasons: trees (spring), grass  (summer) and ragweed/weeds (fall). Keep windows closed in your home and car to lower pollen exposure.  Install air conditioning in the bedroom and throughout the house if possible.  Avoid going out in dry windy days - especially early morning. Pollen counts are highest between 5 - 10 AM and on dry, hot and windy days.  Save outside activities for late afternoon or after a heavy rain, when pollen levels are lower.  Avoid mowing of grass if you have grass pollen allergy. Be aware that pollen can also be transported indoors on people and pets.  Dry your clothes in an automatic dryer rather than hanging them outside where they might collect pollen.  Rinse hair and eyes before bedtime.  Return in about 1 year (around 03/02/2025), or if symptoms worsen or fail to improve.    Thank you for the opportunity to care for this patient.  Please do not hesitate to contact me with questions.  Tinnie Forehand, FNP [facility] 

## 2024-03-12 ENCOUNTER — Telehealth: Payer: Self-pay | Admitting: Internal Medicine

## 2024-03-12 MED ORDER — AMPHETAMINE-DEXTROAMPHET ER 30 MG PO CP24: 30.0000 mg | ORAL_CAPSULE | ORAL | 0 refills | Status: DC

## 2024-03-12 MED ORDER — AMPHETAMINE-DEXTROAMPHETAMINE 10 MG PO TABS: 10.0000 mg | ORAL_TABLET | Freq: Every day | ORAL | 0 refills | Status: DC | PRN

## 2024-03-12 NOTE — Telephone Encounter (Signed)
 Copied from CRM [id number]. Topic: Clinical - Medication Refill >> Mar 12, 2024  9:40 AM [name] wrote: Most Recent Primary Care Visit:  Provider: Devonna Foley E  Department: [facility]  Visit Type: OFFICE VISIT  Date: 09/22/2023  Medication: amphetamine -dextroamphetamine  (ADDERALL XR) 30 MG     Has the patient contacted their pharmacy? Yes (Agent: If no, request that the patient contact the pharmacy for the refill. If patient does not wish to contact the pharmacy document the reason why and proceed with request.) (Agent: If yes, when and what did the pharmacy advise?)  Is this the correct pharmacy for this prescription? Yes If no, delete pharmacy and type the correct one.  This is the patient's preferred pharmacy:  [facility] #[id number] [facility], [place] - [address] AT [facility] [address] [place] [address] Phone: [phone] Fax: [phone]   Has the prescription been filled recently? No  Is the patient out of the medication? Yes  Has the patient been seen for an appointment in the last year OR does the patient have an upcoming appointment? Yes  Can we respond through MyChart? Yes  Agent: Please be advised that Rx refills may take up to 3 business days. We ask that you follow-up with your pharmacy.

## 2024-03-12 NOTE — Telephone Encounter (Signed)
 PDMP okay, Rx sent

## 2024-03-12 NOTE — Telephone Encounter (Signed)
 Requesting: Adderall XR 30mg  and Adderall 10mg  Contract: 01/27/23 UDS: 01/27/23 Last Visit: 09/22/23 Next Visit: 03/22/24 Last Refill: see med list   Please Advise

## 2024-03-22 ENCOUNTER — Ambulatory Visit (INDEPENDENT_AMBULATORY_CARE_PROVIDER_SITE_OTHER): Payer: Medicare Other | Admitting: Internal Medicine

## 2024-03-22 ENCOUNTER — Encounter: Payer: Self-pay | Admitting: Internal Medicine

## 2024-03-22 VITALS — BP 126/82 | HR 83 | Temp 98.2°F | Resp 16 | Ht 71.0 in | Wt 184.5 lb

## 2024-03-22 DIAGNOSIS — F988 Other specified behavioral and emotional disorders with onset usually occurring in childhood and adolescence: Secondary | ICD-10-CM | POA: Diagnosis not present

## 2024-03-22 DIAGNOSIS — Z79899 Other long term (current) drug therapy: Secondary | ICD-10-CM

## 2024-03-22 DIAGNOSIS — E785 Hyperlipidemia, unspecified: Secondary | ICD-10-CM | POA: Diagnosis not present

## 2024-03-22 DIAGNOSIS — G4700 Insomnia, unspecified: Secondary | ICD-10-CM | POA: Diagnosis not present

## 2024-03-22 NOTE — Progress Notes (Unsigned)
 Subjective:    Patient ID: [name], male    DOB: 01-12-1956, 68 y.o.   MRN: [id number]  DOS:  03/22/2024 Type of visit - description: Routine visit  Feeling well, no major concerns. Remains active, plays golf, occasional back pain.  Review of Systems See above   Past Medical History:  Diagnosis Date   ADD (attention deficit disorder)    adult- per psych   Adenomatous colon polyp    Allergy     Rag weed   Cancer (HCC)    basal cell skin cancer    Diverticulitis    Perforated sigmoid diverticulitis    Fall 02/2009   fx left hip and wrist   Glaucoma    LEFT eye   Heart murmur    Echo 2003 "thickened and somehow myomateous MV, mild MR"   HOH (hard of hearing)    Hydrocele 2016   Large, symptomatic left hydrocele, planned hydrocelectomy in 10/2015 with Dr. Joie Narrow   Hyperlipidemia    on meds   Insomnia    per psych   Seasonal allergies    Wears glasses     Past Surgical History:  Procedure Laterality Date   COLON SURGERY  2009?   COLONOSCOPY  2017   JP-MAC-suprep(exc)-TA and benign lymphoid -recall 5 yrs   FRACTURE SURGERY     HIP SURGERY Left 2011   ORIF femur   HYDROCELE EXCISION Left 11/02/2015   Procedure: HYDROCELECTOMY ADULT;  Surgeon: Trent Frizzle, MD;  Location: [facility];  Service: Urology;  Laterality: Left;   LAPAROSCOPIC SIGMOID COLECTOMY N/A 02/11/2013   Procedure: LAPAROSCOPIC SIGMOID COLECTOMY WITH MOBILIZATION SPLENIC FLEXURE;  Surgeon: Evander Hills, DO;  Location: [facility];  Service: General;  Laterality: N/A;   nose biopsy  2011   Dr Fleurette Humbles, no cancer   POLYPECTOMY  2017   TA and benign lymphoid   WISDOM TOOTH EXTRACTION     WRIST SURGERY Left 2011    Current Outpatient Medications  Medication Instructions   amphetamine -dextroamphetamine  (ADDERALL XR) 30 MG 24 hr capsule 30 mg, Oral, BH-each morning   amphetamine -dextroamphetamine  (ADDERALL) 10 MG tablet 10 mg, Oral, Daily PRN   atorvastatin  (LIPITOR) 20 mg, Oral,  Daily at bedtime   clindamycin  (CLINDAGEL) 1 % gel 1 Application, Topical, Daily PRN   diclofenac  sodium (VOLTAREN ) 4 g, Topical, 4 times daily PRN   montelukast  (SINGULAIR ) 10 mg, Oral, Daily at bedtime   Multiple Vitamins-Minerals (CENTRUM ULTRA MENS) TABS 1 tablet, 3 times weekly   Olopatadine -Mometasone (RYALTRIS ) 665-25 MCG/ACT SUSP 1-2 sprays, Nasal, 2 times daily   sildenafil  (REVATIO ) 20 MG tablet TAKE 3 TO 4 TABLETS BY MOUTH AT BEDTIME AS NEEDED   traZODone  (DESYREL ) 50-100 mg, Oral, At bedtime PRN       Objective:   Physical Exam BP 126/82   Pulse 83   Temp 98.2 F (36.8 C) (Oral)   Resp 16   Ht 5\' 11"  (1.803 m)   Wt 184 lb 8 oz (83.7 kg)   SpO2 96%   BMI 25.73 kg/m  General:   Well developed, NAD, BMI noted. HEENT:  Normocephalic . Face symmetric, atraumatic Lungs:  CTA B Normal respiratory effort, no intercostal retractions, no accessory muscle use. Heart: RRR,  no murmur.  Lower extremities: no pretibial edema bilaterally  Skin: Not pale. Not jaundice Neurologic:  alert & oriented X3.  Speech normal, gait appropriate for age and unassisted Psych--  Cognition and judgment appear intact.  Cooperative with normal attention span  and concentration.  Behavior appropriate. No anxious or depressed appearing.      Assessment   ASSESSMENT Dyslipidemia  ADD,  Insomnia per  [employer] Counseling >>> Rx transferred to PCP 09-2019 Snoring palpable aorta: CT 2014 no AAA H/o diverticulitis, perforated, sigmoid colectomy 2014 H/o Hydrocele 2016, large, symptomatic, saw urology, excised H/o BCC, sees derm Barbae folliculitis, previously dermatology prescribed Ziana, now on Flagyl  as needed H/o  heart murmur, echo 2013 --thick MV, mild MR   PLAN Dyslipidemia: Well-controlled per last FLP, continue atorvastatin  ADHD, insomnia: Takes Adderall XR 30 mg every day, Adderall 10 mg as needed.  Occasionally gets a headache with the quick release Adderall. Insomnia  controlled on trazodone .  Just got a refill on Adderall we will call prn Vaccine advised: Declined PNM 20 today. RTC 09-2024, routine checkup

## 2024-03-22 NOTE — Patient Instructions (Addendum)
 INSTRUCTIONS  FOR TODAY   GO TO THE LAB : Provide a urine sample   Next office visit for a checkup in 6 months Please make an appointment before you leave today

## 2024-03-23 NOTE — Assessment & Plan Note (Signed)
 Dyslipidemia: Well-controlled per last FLP, continue atorvastatin  ADHD, insomnia: Takes Adderall XR 30 mg every day, Adderall 10 mg as needed.  Occasionally gets a headache with the quick release Adderall. Insomnia controlled on trazodone .  Just got a refill on Adderall we will call prn Vaccine advised: Declined PNM 20 today. RTC 09-2024, routine checkup

## 2024-03-24 LAB — DRUG MONITORING PANEL 375977 , URINE
Alcohol Metabolites: POSITIVE ng/mL — AB (ref <500)
Amphetamine: 2677 ng/mL — ABNORMAL HIGH (ref <250)
Amphetamines: POSITIVE ng/mL — AB (ref <500)
Barbiturates: NEGATIVE ng/mL (ref <300)
Benzodiazepines: NEGATIVE ng/mL (ref <100)
Cocaine Metabolite: NEGATIVE ng/mL (ref <150)
Desmethyltramadol: NEGATIVE ng/mL (ref <100)
Ethyl Glucuronide (ETG): 1457 ng/mL — ABNORMAL HIGH (ref <500)
Ethyl Sulfate (ETS): 714 ng/mL — ABNORMAL HIGH (ref <100)
Marijuana Metabolite: NEGATIVE ng/mL (ref <20)
Methamphetamine: NEGATIVE ng/mL (ref <250)
Opiates: NEGATIVE ng/mL (ref <100)
Oxycodone: NEGATIVE ng/mL (ref <100)
Tramadol: NEGATIVE ng/mL (ref <100)

## 2024-03-24 LAB — DM TEMPLATE

## 2024-04-13 ENCOUNTER — Telehealth: Payer: Self-pay | Admitting: Internal Medicine

## 2024-04-13 NOTE — Telephone Encounter (Unsigned)
 Copied from CRM [id number]. Topic: Clinical - Medication Refill >> Apr 13, 2024 10:22 AM Jenice Mitts wrote: Medication: amphetamine -dextroamphetamine  (ADDERALL XR) 30 MG 24 hr capsule  Has the patient contacted their pharmacy? Yes (Agent: If no, request that the patient contact the pharmacy for the refill. If patient does not wish to contact the pharmacy document the reason why and proceed with request.) (Agent: If yes, when and what did the pharmacy advise?)  This is the patient's preferred pharmacy:  [facility] DR[facility]ORE #[id number] [facility], [place] - [address] AT [facility] [address] [place] [address] Phone: [phone] Fax: [phone]  Is this the correct pharmacy for this prescription? Yes If no, delete pharmacy and type the correct one.   Has the prescription been filled recently? Yes  Is the patient out of the medication? Yes  Has the patient been seen for an appointment in the last year OR does the patient have an upcoming appointment? Yes  Can we respond through MyChart? Yes  Agent: Please be advised that Rx refills may take up to 3 business days. We ask that you follow-up with your pharmacy.

## 2024-04-14 MED ORDER — AMPHETAMINE-DEXTROAMPHET ER 30 MG PO CP24: 30.0000 mg | ORAL_CAPSULE | ORAL | 0 refills | Status: DC

## 2024-04-14 NOTE — Telephone Encounter (Signed)
 Requesting: Adderall XR 30mg   Contract: 01/27/23 UDS: 03/22/24 Last Visit: 03/22/24 Next Visit: 09/24/24 Last Refill: 03/12/24 #30 and 0RF   Please Advise

## 2024-04-14 NOTE — Telephone Encounter (Signed)
 PDMP okay, Rx sent

## 2024-04-15 ENCOUNTER — Ambulatory Visit

## 2024-05-06 ENCOUNTER — Other Ambulatory Visit: Payer: Self-pay | Admitting: Internal Medicine

## 2024-05-18 ENCOUNTER — Telehealth: Payer: Self-pay | Admitting: Internal Medicine

## 2024-05-18 NOTE — Telephone Encounter (Signed)
 Copied from CRM [id number]. Topic: Clinical - Medication Refill >> May 18, 2024 12:44 PM Sharnea C wrote: Medication: amphetamine -dextroamphetamine  (ADDERALL XR) 30 MG 24 hr capsule  Has the patient contacted their pharmacy? No (Agent: If no, request that the patient contact the pharmacy for the refill. If patient does not wish to contact the pharmacy document the reason why and proceed with request.) (Agent: If yes, when and what did the pharmacy advise?)  This is the patient's preferred pharmacy:  [facility] DRUG STORE #[id number] [facility], [place] - [address] AT [facility] [address] [place] [address] Phone: [phone] Fax: [phone]  Is this the correct pharmacy for this prescription? Yes If no, delete pharmacy and type the correct one.   Has the prescription been filled recently? No  Is the patient out of the medication? Yes  Has the patient been seen for an appointment in the last year OR does the patient have an upcoming appointment? Yes  Can we respond through MyChart? No  Agent: Please be advised that Rx refills may take up to 3 business days. We ask that you follow-up with your pharmacy.

## 2024-05-18 NOTE — Telephone Encounter (Signed)
 Already on file at pharmacy

## 2024-05-19 ENCOUNTER — Other Ambulatory Visit: Payer: Self-pay | Admitting: Internal Medicine

## 2024-05-19 ENCOUNTER — Ambulatory Visit
Admission: EM | Admit: 2024-05-19 | Discharge: 2024-05-19 | Disposition: A | Discharge status: Home / Self Care | Attending: Physician Assistant | Admitting: Physician Assistant

## 2024-05-19 DIAGNOSIS — J019 Acute sinusitis, unspecified: Secondary | ICD-10-CM

## 2024-05-19 DIAGNOSIS — J302 Other seasonal allergic rhinitis: Secondary | ICD-10-CM

## 2024-05-19 MED ORDER — AMOXICILLIN-POT CLAVULANATE 875-125 MG PO TABS: 1.0000 | ORAL_TABLET | Freq: Two times a day (BID) | ORAL | 0 refills | Status: DC

## 2024-05-19 NOTE — Discharge Instructions (Signed)
 Based on your symptoms and duration of illness, I believe you may have a bacterial sinus infection  These typically resolve with antibiotic therapy along with at-home comfort measures  Today I have sent in a prescription for  Augmentin 875-125 mg to be taken by mouth twice per day for 7 days FINISH THE ENTIRE COURSE unless you develop an allergic reaction or are instructed to discontinue.  It can take a few days for the antibiotic to kick in so I recommend symptomatic relief with over the counter medication such as the following: Dayquil/ Nyquil Theraflu Alkaseltzer   If you have high blood pressure I recommend that you take Mucinex, Robitussin, Tylenol instead of the combination medications.  Combination medications typically have a decongestant in them that can cause your blood pressure to be high.  These medications typically have Tylenol in them already so you do not need to supplement with more outside of those medications  Stay well hydrated with at least 75 oz of water per day to help with recovery  If at any point you start to develop swelling around the eyes and nose, trouble seeing, fevers that are not responding to medications, trouble breathing, passing out or headaches that are very severe please go to the emergency room for further evaluation management

## 2024-05-19 NOTE — ED Triage Notes (Signed)
 Pt states cough,congestion,watery eyes and pressure in his ears for the past week.

## 2024-05-19 NOTE — ED Provider Notes (Addendum)
 [facility]    CSN: [id number] Arrival date & time: 05/19/24  9040      History   Chief Complaint Chief Complaint  Patient presents with   Cough    HPI [name] is a 68 y.o. male.   HPI  Pt reports concerns for nasal congestion, predominantly dry coughing, sore throat He reports ear fullness, eye discharge and irritation but no pain  He reports hx of allergy  symptoms especially to ragweed He takes nasal spray and has been using Mucinex for the last week to help with symptoms  He reports limited improvement with home measures    Past Medical History:  Diagnosis Date   ADD (attention deficit disorder)    adult- per psych   Adenomatous colon polyp    Allergy     Rag weed   Cancer (HCC)    basal cell skin cancer    Diverticulitis    Perforated sigmoid diverticulitis    Fall 02/2009   fx left hip and wrist   Glaucoma    LEFT eye   Heart murmur    Echo 2003 thickened and somehow myomateous MV, mild MR   HOH (hard of hearing)    Hydrocele 2016   Large, symptomatic left hydrocele, planned hydrocelectomy in 10/2015 with Dr. Matilda   Hyperlipidemia    on meds   Insomnia    per psych   Seasonal allergies    Wears glasses     Patient Active Problem List   Diagnosis Date Noted   Dyslipidemia 09/22/2023   Other allergic rhinitis 07/15/2022   Allergic conjunctivitis of both eyes 07/15/2022   Erectile dysfunction 09/19/2020   Abnormality of gait 02/13/2017   PCP NOTES >>>>>>>>>>>>>>>>>>>>>>>>>> 01/03/2016   Folliculitis 07/04/2015   Heart murmur 12/23/2014   Annual physical exam 12/22/2013   Snoring 12/22/2013   Diverticulitis s/p lap colectomy [id number] 02/13/2013   ADD (attention deficit disorder)    Insomnia    Fatigue 07/15/2011    Past Surgical History:  Procedure Laterality Date   COLON SURGERY  2009?   COLONOSCOPY  2017   JP-MAC-suprep(exc)-TA and benign lymphoid -recall 5 yrs   FRACTURE SURGERY     HIP SURGERY Left 2011   ORIF  femur   HYDROCELE EXCISION Left 11/02/2015   Procedure: HYDROCELECTOMY ADULT;  Surgeon: Garnette Matilda, MD;  Location: [facility];  Service: Urology;  Laterality: Left;   LAPAROSCOPIC SIGMOID COLECTOMY N/A 02/11/2013   Procedure: LAPAROSCOPIC SIGMOID COLECTOMY WITH MOBILIZATION SPLENIC FLEXURE;  Surgeon: Redell Faith, DO;  Location: [facility];  Service: General;  Laterality: N/A;   nose biopsy  2011   Dr Ivin, no cancer   POLYPECTOMY  2017   TA and benign lymphoid   WISDOM TOOTH EXTRACTION     WRIST SURGERY Left 2011       Home Medications    Prior to Admission medications   Medication Sig Start Date End Date Taking? Authorizing Provider  amoxicillin -clavulanate (AUGMENTIN ) 875-125 MG tablet Take 1 tablet by mouth every 12 (twelve) hours. 05/19/24  Yes Mylinda Brook E, PA-C  amphetamine -dextroamphetamine  (ADDERALL XR) 30 MG 24 hr capsule Take 1 capsule (30 mg total) by mouth every morning. 04/14/24   Paz, Jose E, MD  amphetamine -dextroamphetamine  (ADDERALL XR) 30 MG 24 hr capsule Take 1 capsule (30 mg total) by mouth every morning. 04/14/24   Paz, Jose E, MD  amphetamine -dextroamphetamine  (ADDERALL) 10 MG tablet Take 1 tablet (10 mg total) by mouth daily as needed. 03/12/24  Amon Aloysius BRAVO, MD  atorvastatin  (LIPITOR) 20 MG tablet Take 1 tablet (20 mg total) by mouth at bedtime. 01/07/24   Paz, Jose E, MD  clindamycin  (CLINDAGEL) 1 % gel Apply 1 Application topically daily as needed. 09/05/22   Paz, Jose E, MD  diclofenac  sodium (VOLTAREN ) 1 % GEL Apply 4 g topically 4 (four) times daily as needed. 08/18/19   Amon Aloysius BRAVO, MD  montelukast  (SINGULAIR ) 10 MG tablet Take 1 tablet (10 mg total) by mouth at bedtime. 02/21/23   Luke Orlan HERO, DO  Multiple Vitamins-Minerals (CENTRUM ULTRA MENS) TABS Take 1 tablet by mouth 3 (three) times a week. -Monday, Wednesday, Friday    [provider]  Olopatadine -Mometasone (RYALTRIS ) 665-25 MCG/ACT SUSP Place 1-2 sprays into the nose in the  morning and at bedtime. 03/02/24   Cheryl Reusing, FNP  sildenafil  (REVATIO ) 20 MG tablet TAKE 3 TO 4 TABLETS BY MOUTH AT BEDTIME AS NEEDED 05/06/24   Paz, Jose E, MD  traZODone  (DESYREL ) 100 MG tablet Take 0.5-1 tablets (50-100 mg total) by mouth at bedtime as needed for sleep. 11/06/23   Amon Aloysius BRAVO, MD    Family History Family History  Problem Relation Age of Onset   Breast cancer Mother    Heart failure Mother    Lung cancer Father        smoker   Diabetes Neg Hx    Hypertension Neg Hx    Colon cancer Neg Hx    Prostate cancer Neg Hx    Heart attack Neg Hx    Colon polyps Neg Hx    Esophageal cancer Neg Hx    Rectal cancer Neg Hx    Stomach cancer Neg Hx     Social History Social History   Tobacco Use   Smoking status: Never    Passive exposure: Never   Smokeless tobacco: Never  Vaping Use   Vaping status: Never Used  Substance Use Topics   Alcohol use: Not Currently    Alcohol/week: 4.0 standard drinks of alcohol    Types: 4 Standard drinks or equivalent per week    Comment: social   Drug use: No     Allergies   Patient has no known allergies.   Review of Systems Review of Systems  Constitutional:  Positive for chills and diaphoresis. Negative for fever.  HENT:  Positive for congestion, postnasal drip, sinus pressure, sinus pain and sore throat.   Eyes:  Positive for discharge and redness.  Respiratory:  Positive for cough. Negative for chest tightness, shortness of breath and wheezing.      Physical Exam Triage Vital Signs ED Triage Vitals  Encounter Vitals Group     BP 05/19/24 1005 129/82     Girls Systolic BP Percentile --      Girls Diastolic BP Percentile --      Boys Systolic BP Percentile --      Boys Diastolic BP Percentile --      Pulse Rate 05/19/24 1005 83     Resp 05/19/24 1005 18     Temp 05/19/24 1005 98 F (36.7 C)     Temp Source 05/19/24 1005 Oral     SpO2 05/19/24 1005 96 %     Weight 05/19/24 1005 185 lb (83.9 kg)      Height 05/19/24 1005 6' 1 (1.854 m)     Head Circumference --      Peak Flow --      Pain Score 05/19/24 1008 0  Pain Loc --      Pain Education --      Exclude from Growth Chart --    No data found.  Updated Vital Signs BP 129/82 (BP Location: Right Arm)   Pulse 83   Temp 98 F (36.7 C) (Oral)   Resp 18   Ht 6' 1 (1.854 m)   Wt 185 lb (83.9 kg)   SpO2 96%   BMI 24.41 kg/m   Visual Acuity Right Eye Distance:   Left Eye Distance:   Bilateral Distance:    Right Eye Near:   Left Eye Near:    Bilateral Near:     Physical Exam Vitals reviewed.  Constitutional:      General: He is awake.     Appearance: Normal appearance. He is well-developed and well-groomed.  HENT:     Head: Normocephalic and atraumatic.     Right Ear: Hearing, tympanic membrane and ear canal normal.     Left Ear: Hearing, tympanic membrane and ear canal normal.     Mouth/Throat:     Lips: Pink.     Mouth: Mucous membranes are moist.     Pharynx: Oropharynx is clear. Uvula midline. No pharyngeal swelling, oropharyngeal exudate, posterior oropharyngeal erythema, uvula swelling or postnasal drip.     Tonsils: No tonsillar exudate or tonsillar abscesses.  Eyes:     General: Lids are normal. Gaze aligned appropriately.     Extraocular Movements: Extraocular movements intact.  Cardiovascular:     Rate and Rhythm: Normal rate and regular rhythm.     Heart sounds: Normal heart sounds.  Pulmonary:     Effort: Pulmonary effort is normal.     Breath sounds: Normal breath sounds. No decreased air movement. No decreased breath sounds, wheezing, rhonchi or rales.  Musculoskeletal:     Cervical back: Normal range of motion and neck supple.  Lymphadenopathy:     Head:     Right side of head: No submental, submandibular or preauricular adenopathy.     Left side of head: No submental, submandibular or preauricular adenopathy.     Cervical:     Right cervical: No superficial cervical adenopathy.    Left  cervical: No superficial cervical adenopathy.     Upper Body:     Right upper body: No supraclavicular adenopathy.     Left upper body: No supraclavicular adenopathy.  Skin:    General: Skin is warm and dry.  Neurological:     General: No focal deficit present.     Mental Status: He is alert and oriented to person, place, and time.  Psychiatric:        Mood and Affect: Mood normal.        Behavior: Behavior normal. Behavior is cooperative.        Thought Content: Thought content normal.        Judgment: Judgment normal.      UC Treatments / Results  Labs (all labs ordered are listed, but only abnormal results are displayed) Labs Reviewed - No data to display  EKG   Radiology No results found.  Procedures Procedures (including critical care time)  Medications Ordered in UC Medications - No data to display  Initial Impression / Assessment and Plan / UC Course  I have reviewed the triage vital signs and the nursing notes.  Pertinent labs & imaging results that were available during my care of the patient were reviewed by me and considered in my medical decision making (see chart for  details).      Final Clinical Impressions(s) / UC Diagnoses   Final diagnoses:  Seasonal allergic rhinitis, unspecified trigger  Acute sinusitis, recurrence not specified, unspecified location   Pt presents today with concerns for persistent nasal congestion, sore throat, chills, sinus pressure and pain that is not improving with home measures. Given chronicity of symptoms and lack of improvement with home measures, I suspect potential bacterial sinusitis. Recommend starting abx- will send in Augmentin  PO BID x 7 days. Recommend continued use of OTC medications as needed for symptomatic relief. ED and return precautions reviewed and provided in AVS. Follow up as needed for persistent or progressing symptoms      Discharge Instructions      Based on your symptoms and duration of  illness, I believe you may have a bacterial sinus infection  These typically resolve with antibiotic therapy along with at-home comfort measures  Today I have sent in a prescription for  Augmentin  875-125 mg to be taken by mouth twice per day for 7 days FINISH THE ENTIRE COURSE unless you develop an allergic reaction or are instructed to discontinue.  It can take a few days for the antibiotic to kick in so I recommend symptomatic relief with over the counter medication such as the following: Dayquil/ Nyquil Theraflu Alkaseltzer   If you have high blood pressure I recommend that you take Mucinex, Robitussin, Tylenol  instead of the combination medications.  Combination medications typically have a decongestant in them that can cause your blood pressure to be high.  These medications typically have Tylenol  in them already so you do not need to supplement with more outside of those medications  Stay well hydrated with at least 75 oz of water  per day to help with recovery  If at any point you start to develop swelling around the eyes and nose, trouble seeing, fevers that are not responding to medications, trouble breathing, passing out or headaches that are very severe please go to the emergency room for further evaluation management      ED Prescriptions     Medication Sig Dispense Auth. Provider   amoxicillin -clavulanate (AUGMENTIN ) 875-125 MG tablet Take 1 tablet by mouth every 12 (twelve) hours. 14 tablet Shauntay Brunelli E, PA-C      PDMP not reviewed this encounter.   Aixa Corsello, Rocky BRAVO, PA-C 05/19/24 1140    Kenyen Candy E, PA-C 05/19/24 1140

## 2024-05-31 ENCOUNTER — Telehealth: Payer: Self-pay | Admitting: Internal Medicine

## 2024-05-31 NOTE — Telephone Encounter (Signed)
 Copied from CRM [id number]. Topic: Medicare AWV >> May 31, 2024 10:18 AM Nathanel DEL wrote: Reason for CRM: LVM 05/31/2024 to schedule AWV. Please schedule Virtual or Telehealth visits ONLY.   Nathanel Paschal; Care Guide Ambulatory Clinical Support [place] [facility] Direct Dial: [phone]

## 2024-07-02 ENCOUNTER — Other Ambulatory Visit: Payer: Self-pay | Admitting: Internal Medicine

## 2024-08-03 ENCOUNTER — Telehealth: Payer: Self-pay | Admitting: Internal Medicine

## 2024-08-03 MED ORDER — AMPHETAMINE-DEXTROAMPHET ER 30 MG PO CP24: 30.0000 mg | ORAL_CAPSULE | ORAL | 0 refills | Status: DC

## 2024-08-03 MED ORDER — AMPHETAMINE-DEXTROAMPHETAMINE 10 MG PO TABS: 10.0000 mg | ORAL_TABLET | Freq: Every day | ORAL | 0 refills | Status: DC | PRN

## 2024-08-03 NOTE — Telephone Encounter (Signed)
 Copied from CRM #[id number]. Topic: Clinical - Medication Refill >> Aug 03, 2024 10:24 AM Deleta S wrote: Medication: amphetamine -dextroamphetamine  (ADDERALL XR) 30 MG 24 hr capsule and amphetamine -dextroamphetamine  (ADDERALL) 10 MG tablet  Has the patient contacted their pharmacy? No (Agent: If no, request that the patient contact the pharmacy for the refill. If patient does not wish to contact the pharmacy document the reason why and proceed with request.) (Agent: If yes, when and what did the pharmacy advise?)  This is the patient's preferred pharmacy:  [facility] DR[facility] STORE #[id number] [facility], [place] - [address] AT [facility] [address] [place] [address] Phone: [phone] Fax: [phone]  Is this the correct pharmacy for this prescription? Yes If no, delete pharmacy and type the correct one.   Has the prescription been filled recently? No  Is the patient out of the medication? Yes  Has the patient been seen for an appointment in the last year OR does the patient have an upcoming appointment? Yes  Can we respond through MyChart? Yes  Agent: Please be advised that Rx refills may take up to 3 business days. We ask that you follow-up with your pharmacy.

## 2024-08-03 NOTE — Telephone Encounter (Signed)
 PDMP okay, Rx sent

## 2024-08-03 NOTE — Telephone Encounter (Signed)
 Requesting: Adderall XR 30mg  and Adderall 10mg  Contract:01/27/23 UDS:03/22/24 Last Visit: 03/22/24 Next Visit:09/24/24 Last Refill: see med list   Please Advise

## 2024-08-23 ENCOUNTER — Encounter: Payer: Self-pay | Admitting: Family Medicine

## 2024-08-23 ENCOUNTER — Other Ambulatory Visit: Payer: Self-pay

## 2024-08-23 ENCOUNTER — Ambulatory Visit (INDEPENDENT_AMBULATORY_CARE_PROVIDER_SITE_OTHER): Admitting: Family Medicine

## 2024-08-23 VITALS — BP 138/84 | HR 107 | Ht 73.0 in | Wt 185.0 lb

## 2024-08-23 DIAGNOSIS — G8929 Other chronic pain: Secondary | ICD-10-CM | POA: Diagnosis not present

## 2024-08-23 DIAGNOSIS — M25562 Pain in left knee: Secondary | ICD-10-CM

## 2024-08-23 NOTE — Progress Notes (Signed)
   I, Leotis Batter, CMA acting as a scribe for Artist Lloyd, MD.  [name] is a 68 y.o. male who presents to [facility] today for exacerbation of his L knee pain. Pt was last seen by Dr. Lloyd on 07/10/22 and was given L knee steroid injection and advised to use Voltaren  gel and quad strengthening.  Today, pt reports exacerbation of left knee sx over the past 3 weeks. Denies visible swelling or mechanical sx. Describes pain at aching. Locates pain to medial aspect. Advil  prn for sx.   Dx imaging: 07/04/21 L knee XR   Pertinent review of systems: No fevers or chills  Relevant historical information: Insomnia   Exam:  BP 138/84   Pulse (!) 107   Ht 6' 1 (1.854 m)   Wt 185 lb (83.9 kg)   SpO2 98%   BMI 24.41 kg/m  General: Well Developed, well nourished, and in no acute distress.   MSK: Left knee normal-appearing normal motion with crepitation.  Tender palpation medial joint line.    Lab and Radiology Results  Procedure: Real-time Ultrasound Guided Injection of left knee joint superior lateral patella space Device: Philips Affiniti 50G/GE Logiq Images permanently stored and available for review in PACS Verbal informed consent obtained.  Discussed risks and benefits of procedure. Warned about infection, bleeding, hyperglycemia damage to structures among others. Patient expresses understanding and agreement Time-out conducted.   Noted no overlying erythema, induration, or other signs of local infection.   Skin prepped in a sterile fashion.   Local anesthesia: Topical Ethyl chloride.   With sterile technique and under real time ultrasound guidance: 40 mg of Kenalog  and 2 mL of Marcaine  injected into knee joint. Fluid seen entering the joint capsule.   Completed without difficulty   Pain immediately resolved suggesting accurate placement of the medication.   Advised to call if fevers/chills, erythema, induration, drainage, or persistent  bleeding.   Images permanently stored and available for review in the ultrasound unit.  Impression: Technically successful ultrasound guided injection.       Assessment and Plan: 68 y.o. 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  Plan for steroid injection.  Continue quadricep strengthening exercises.   PDMP not reviewed this encounter. Orders Placed This Encounter  Procedures   US  LIMITED JOINT SPACE STRUCTURES LOW LEFT(NO LINKED CHARGES)    Reason for Exam (SYMPTOM  OR DIAGNOSIS REQUIRED):   left knee pain    Preferred imaging location?:    [facility]   No orders of the defined types were placed in this encounter.    Discussed warning signs or symptoms. Please see discharge instructions. Patient expresses understanding.   The above documentation has been reviewed and is accurate and complete Artist Lloyd, M.D.

## 2024-08-23 NOTE — Patient Instructions (Addendum)
 Thank you for coming in today.   You received an injection today. Seek immediate medical attention if the joint becomes red, extremely painful, or is oozing fluid.   See you back as needed.

## 2024-08-31 ENCOUNTER — Other Ambulatory Visit: Payer: Self-pay | Admitting: Internal Medicine

## 2024-08-31 MED ORDER — AMPHETAMINE-DEXTROAMPHET ER 30 MG PO CP24: 30.0000 mg | ORAL_CAPSULE | ORAL | 0 refills | Status: DC

## 2024-08-31 NOTE — Telephone Encounter (Signed)
 Copied from CRM [id number]. Topic: Clinical - Medication Refill >> Aug 31, 2024  9:57 AM Ahlexyia S wrote: Medication: amphetamine -dextroamphetamine  (ADDERALL XR) 30 MG 24 hr capsule  Has the patient contacted their pharmacy? No, pt stated it is a controlled substance and needs to contact clinic. (Agent: If no, request that the patient contact the pharmacy for the refill. If patient does not wish to contact the pharmacy document the reason why and proceed with request.) (Agent: If yes, when and what did the pharmacy advise?)  This is the patient's preferred pharmacy:  [facility] [place] DR[facility] STORE #[id number] [facility], [place] - [address] AT [facility] [address] [place] [address] Phone: [phone] Fax: [phone]  Is this the correct pharmacy for this prescription? Yes If no, delete pharmacy and type the correct one.   Has the prescription been filled recently? No  Is the patient out of the medication? No, limited supply.  Has the patient been seen for an appointment in the last year OR does the patient have an upcoming appointment? Yes  Can we respond through MyChart? Yes  Agent: Please be advised that Rx refills may take up to 3 business days. We ask that you follow-up with your pharmacy.

## 2024-08-31 NOTE — Telephone Encounter (Signed)
 PDMP okay, Rx sent

## 2024-08-31 NOTE — Telephone Encounter (Signed)
 Requesting: Adderall XR 30mg   Contract:01/27/23 UDS: 03/22/24 Last Visit: 03/22/24 Next Visit: 12/10/24 Last Refill: 08/03/24 #30 and 0RF   Please Advise

## 2024-09-02 ENCOUNTER — Telehealth: Payer: Self-pay | Admitting: Internal Medicine

## 2024-09-02 NOTE — Telephone Encounter (Signed)
 Copied from CRM [id number]. Topic: Medicare AWV >> Sep 02, 2024 11:45 AM Nathanel DEL wrote: Reason for CRM: Called LVM 09/02/2024 to schedule AWV. Please schedule office or virtual visits.  Nathanel Paschal; Care Guide Ambulatory Clinical Support [place] [facility] Direct Dial: [phone]

## 2024-09-20 ENCOUNTER — Ambulatory Visit: Admitting: Medical

## 2024-09-20 VITALS — BP 150/90 | HR 87 | Temp 97.8°F | Resp 14 | Ht 73.0 in | Wt 184.2 lb

## 2024-09-20 DIAGNOSIS — J309 Allergic rhinitis, unspecified: Secondary | ICD-10-CM | POA: Diagnosis not present

## 2024-09-20 DIAGNOSIS — J0100 Acute maxillary sinusitis, unspecified: Secondary | ICD-10-CM | POA: Diagnosis not present

## 2024-09-20 MED ORDER — AMOXICILLIN-POT CLAVULANATE 875-125 MG PO TABS: 1.0000 | ORAL_TABLET | Freq: Two times a day (BID) | ORAL | 0 refills | Status: DC

## 2024-09-20 NOTE — Patient Instructions (Addendum)
 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 Current episode with nasal congestion and maxillary tenderness. Previous Augmentin  treatment effective. - Prescribed Augmentin  875 mg twice daily for 10 days. - Continue Rialtris nasal spray during infection. - Hold Zyrtec during infection to avoid drying. - Restart Zyrtec after 10 days if symptoms improve. - Consider Medrol  dose pack or steroid injection if symptoms persist.  Follow up 10 days or sooner if signs/symptoms persist or worsen

## 2024-09-20 NOTE — Progress Notes (Signed)
 Subjective:    Patient ID: [name], male    DOB: 1956/01/16, 68 y.o.   MRN: [id number]  HPI  [name] is a 68 year old male with seasonal allergies who presents with sinus congestion and tenderness.  He experiences ragweed allergies, which frequently lead to sinus infections, occurring approximately twice a year, typically in the summer, fall or spring. Nasal congestion has progressively worsened over the past week. He started zyrtec mid summer to avoid allergy  symptoms.  He describes tenderness in his cheeks and ears, along with significant nasal drainage. There is slight sensitivity in his upper teeth but no significant tooth pain. No colored mucus when blowing his nose.  His current medication regimen includes daily Zyrtec, and currently uses allergy  nasalspray. SABRA He has a supply of Rialtris, which he finds effective, and uses it daily.  In the past, he has been treated with Augmentin  for sinus infections, which he tolerated well without side effects.    Review of Systems  Constitutional:  Negative for chills, fatigue and fever.  HENT:  Positive for congestion, postnasal drip, sinus pressure and sinus pain.   Respiratory:  Negative for cough and wheezing.   Cardiovascular:  Negative for chest pain and palpitations.  Gastrointestinal:  Negative for abdominal pain.  Genitourinary:  Negative for dysuria.  Musculoskeletal:  Negative for back pain.  Neurological:  Negative for dizziness, weakness and light-headedness.  Hematological:  Negative for adenopathy.  Psychiatric/Behavioral:  Negative for behavioral problems.     Past Medical History:  Diagnosis Date   ADD (attention deficit disorder)    adult- per psych   Adenomatous colon polyp    Allergy     Rag weed   Cancer (HCC)    basal cell skin cancer    Diverticulitis    Perforated sigmoid diverticulitis    Fall 02/2009   fx left hip and wrist   Glaucoma    LEFT eye   Heart murmur    Echo 2003 thickened and  somehow myomateous MV, mild MR   HOH (hard of hearing)    Hydrocele 2016   Large, symptomatic left hydrocele, planned hydrocelectomy in 10/2015 with Dr. Matilda   Hyperlipidemia    on meds   Insomnia    per psych   Seasonal allergies    Wears glasses      Social History   Socioeconomic History   Marital status: Married    Spouse name: Leita   Number of children: 3   Years of education: Not on file   Highest education level: Master's degree (e.g., MA, MS, MEng, MEd, MSW, MBA)  Occupational History   Occupation: photographer, Herbalist   Occupation: Retired, 06-2020  Tobacco Use   Smoking status: Never    Passive exposure: Never   Smokeless tobacco: Never  Vaping Use   Vaping status: Never Used  Substance and Sexual Activity   Alcohol use: Not Currently    Alcohol/week: 4.0 standard drinks of alcohol    Types: 4 Standard drinks or equivalent per week    Comment: social   Drug use: No   Sexual activity: Yes  Other Topics Concern   Not on file  Social History Narrative   3 adult children, 3 g-children   Household:  pt and wife   Walk 18 holes x 2/w   Social Drivers of Health   Financial Resource Strain: Low Risk  (03/15/2024)   Overall Financial Resource Strain (CARDIA)    Difficulty of Paying Living  Expenses: Not hard at all  Food Insecurity: No Food Insecurity (03/15/2024)   Hunger Vital Sign    Worried About Running Out of Food in the Last Year: Never true    Ran Out of Food in the Last Year: Never true  Transportation Needs: No Transportation Needs (03/15/2024)   PRAPARE - Administrator, Civil Service (Medical): No    Lack of Transportation (Non-Medical): No  Physical Activity: Sufficiently Active (03/15/2024)   Exercise Vital Sign    Days of Exercise per Week: 2 days    Minutes of Exercise per Session: 150+ min  Stress: No Stress Concern Present (03/15/2024)   [employer] of Occupational Health - Occupational Stress Questionnaire    Feeling  of Stress : Not at all  Social Connections: Socially Integrated (03/15/2024)   Social Connection and Isolation Panel    Frequency of Communication with Friends and Family: More than three times a week    Frequency of Social Gatherings with Friends and Family: Twice a week    Attends Religious Services: More than 4 times per year    Active Member of [employer] or Organizations: Yes    Attends Banker Meetings: More than 4 times per year    Marital Status: Married  Catering Manager Violence: Not At Risk (03/24/2023)   Humiliation, Afraid, Rape, and Kick questionnaire    Fear of Current or Ex-Partner: No    Emotionally Abused: No    Physically Abused: No    Sexually Abused: No    Past Surgical History:  Procedure Laterality Date   COLON SURGERY  2009?   COLONOSCOPY  2017   JP-MAC-suprep(exc)-TA and benign lymphoid -recall 5 yrs   FRACTURE SURGERY     HIP SURGERY Left 2011   ORIF femur   HYDROCELE EXCISION Left 11/02/2015   Procedure: HYDROCELECTOMY ADULT;  Surgeon: Garnette Shack, MD;  Location: [facility];  Service: Urology;  Laterality: Left;   LAPAROSCOPIC SIGMOID COLECTOMY N/A 02/11/2013   Procedure: LAPAROSCOPIC SIGMOID COLECTOMY WITH MOBILIZATION SPLENIC FLEXURE;  Surgeon: Redell Faith, DO;  Location: [facility];  Service: General;  Laterality: N/A;   nose biopsy  2011   Dr Ivin, no cancer   POLYPECTOMY  2017   TA and benign lymphoid   WISDOM TOOTH EXTRACTION     WRIST SURGERY Left 2011    Family History  Problem Relation Age of Onset   Breast cancer Mother    Heart failure Mother    Lung cancer Father        smoker   Diabetes Neg Hx    Hypertension Neg Hx    Colon cancer Neg Hx    Prostate cancer Neg Hx    Heart attack Neg Hx    Colon polyps Neg Hx    Esophageal cancer Neg Hx    Rectal cancer Neg Hx    Stomach cancer Neg Hx     No Known Allergies  Current Outpatient Medications on File Prior to Visit  Medication Sig Dispense Refill    amphetamine -dextroamphetamine  (ADDERALL XR) 30 MG 24 hr capsule Take 1 capsule (30 mg total) by mouth every morning. 30 capsule 0   amphetamine -dextroamphetamine  (ADDERALL) 10 MG tablet Take 1 tablet (10 mg total) by mouth daily as needed. 30 tablet 0   atorvastatin  (LIPITOR) 20 MG tablet Take 1 tablet (20 mg total) by mouth at bedtime. 90 tablet 1   clindamycin  (CLINDAGEL) 1 % gel Apply 1 Application topically daily as needed. 60  g 0   diclofenac  sodium (VOLTAREN ) 1 % GEL Apply 4 g topically 4 (four) times daily as needed. 100 g 0   Multiple Vitamins-Minerals (CENTRUM ULTRA MENS) TABS Take 1 tablet by mouth 3 (three) times a week. -Monday, Wednesday, Friday     Olopatadine -Mometasone (RYALTRIS ) 665-25 MCG/ACT SUSP Place 1-2 sprays into the nose in the morning and at bedtime. 29 g 11   sildenafil  (REVATIO ) 20 MG tablet TAKE 3 TO 4 TABLETS BY MOUTH AT BEDTIME AS NEEDED 30 tablet 3   traZODone  (DESYREL ) 100 MG tablet Take 0.5-1 tablets (50-100 mg total) by mouth at bedtime as needed for sleep. 90 tablet 1   No current facility-administered medications on file prior to visit.    BP (!) 150/90   Pulse 87   Temp 97.8 F (36.6 C) (Oral)   Resp 14   Ht 6' 1 (1.854 m)   Wt 184 lb 3.2 oz (83.6 kg)   SpO2 97%   BMI 24.30 kg/m           Objective:   Physical Exam  General Mental Status- Alert. General Appearance- Not in acute distress.   Skin General: Color- Normal Color. Moisture- Normal Moisture.  Neck No JVD.  Chest and Lung Exam Auscultation: Breath Sounds:-Normal.  Cardiovascular Auscultation:Rythm- Regular. Murmurs & Other Heart Sounds:Auscultation of the heart reveals- No Murmurs.  Abdomen Inspection:-Inspection Normal. Palpation/Percussion:Note:No mass. Palpation and Percussion of the abdomen reveal- Non Tender, Non Distended + BS, no rebound or guarding.   Neurologic Cranial Nerve exam:- CN III-XII intact(No nystagmus), symmetric smile. Strength:- 5/5 equal and  symmetric strength both upper and lower extremities.   Heent- canals clear and normal tms. Boggy turbinates, +pnd. Maxillary sinus pressure to palpation. No tonsil hypetrophy.    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 Current episode with nasal congestion and maxillary tenderness. Previous Augmentin  treatment effective. - Prescribed Augmentin  875 mg twice daily for 10 days. - Continue Rialtris nasal spray during infection. - Hold Zyrtec during infection to avoid drying. - Restart Zyrtec after 10 days if symptoms improve. - Consider Medrol  dose pack or steroid injection if symptoms persist.  Follow up 10 days or sooner if signs/symptoms persist or worsen  Hosea Hanawalt, PA-C

## 2024-09-24 ENCOUNTER — Encounter: Admitting: Internal Medicine

## 2024-10-12 ENCOUNTER — Other Ambulatory Visit: Payer: Self-pay | Admitting: Internal Medicine

## 2024-10-12 MED ORDER — AMPHETAMINE-DEXTROAMPHET ER 30 MG PO CP24: 30.0000 mg | ORAL_CAPSULE | ORAL | 0 refills | Status: DC

## 2024-10-12 NOTE — Telephone Encounter (Signed)
 Requesting: Adderall XR 30mg   Contract:01/27/23 UDS: 03/22/24 Last Visit: 03/22/24 Next Visit: 12/10/24 Last Refill: 08/31/24 #30 and 0RF   Please Advise

## 2024-10-12 NOTE — Telephone Encounter (Unsigned)
 Copied from CRM #[id number]. Topic: Clinical - Medication Refill >> Oct 12, 2024  3:08 PM China J wrote: Medication: amphetamine -dextroamphetamine  (ADDERALL XR) 30 MG 24 hr capsule  Has the patient contacted their pharmacy? Yes (Agent: If no, request that the patient contact the pharmacy for the refill. If patient does not wish to contact the pharmacy document the reason why and proceed with request.) (Agent: If yes, when and what did the pharmacy advise?)  This is the patient's preferred pharmacy:  [facility] DRUG STORE #[id number] [facility], [place] - [address] AT [facility] [address] [place] [address] Phone: [phone] Fax: [phone]  Is this the correct pharmacy for this prescription? Yes If no, delete pharmacy and type the correct one.   Has the prescription been filled recently? No  Is the patient out of the medication? Yes  Has the patient been seen for an appointment in the last year OR does the patient have an upcoming appointment? Yes  Can we respond through MyChart? Yes  Agent: Please be advised that Rx refills may take up to 3 business days. We ask that you follow-up with your pharmacy.

## 2024-10-28 ENCOUNTER — Ambulatory Visit (INDEPENDENT_AMBULATORY_CARE_PROVIDER_SITE_OTHER): Admitting: Family Medicine

## 2024-10-28 VITALS — BP 134/74 | HR 107 | Temp 98.3°F | Resp 16 | Ht 73.0 in | Wt 186.0 lb

## 2024-10-28 DIAGNOSIS — J0110 Acute frontal sinusitis, unspecified: Secondary | ICD-10-CM

## 2024-10-28 DIAGNOSIS — J209 Acute bronchitis, unspecified: Secondary | ICD-10-CM | POA: Diagnosis not present

## 2024-10-28 MED ORDER — DOXYCYCLINE HYCLATE 100 MG PO CAPS: 100.0000 mg | ORAL_CAPSULE | Freq: Two times a day (BID) | ORAL | 0 refills | Status: DC

## 2024-10-28 NOTE — Progress Notes (Signed)
 [facility] at [facility] [address] [place], [place] [address] [phone]  Date:  10/28/2024   Name:  [name]   DOB:  [birth year]   MRN:  [id number]  PCP:  Amon Aloysius BRAVO, MD    Chief Complaint: No chief complaint on file.   History of Present Illness:  [name] is a 68 y.o. very pleasant male patient who presents with the following:  Primary pt of Dr Amon seen today with acute illness Generally in good health  No flu shot yet - he plans to do once over this acute illness   Discussed the use of AI scribe software for clinical note transcription with the patient, who gave verbal consent to proceed.  History of Present Illness [name] is a 68 year old male who presents with symptoms of a sinus infection and possible bronchitis.  Symptoms began approximately ten days ago with itchy, watery eyes and have progressively worsened. He now experiences significant nasal and chest congestion, as well as sinus pressure. He denies fever or chills but notes a sore throat and nasal discharge. Over-the-counter medications, including nasal spray and Mucinex, have been used without improvement.  He frequently cares for his seven grandchildren, picking them up from daycare, and notes recent exposure to illnesses, including a grandchild with an ear infection and another with conjunctivitis.  During the review of symptoms, he reports diarrhea today but denies vomiting. He reports taking Adderall and drinking coffee this morning which is likely why he is a bit tachycardic. No shortness of breath.    Patient Active Problem List   Diagnosis Date Noted   Dyslipidemia 09/22/2023   Other allergic rhinitis 07/15/2022   Allergic conjunctivitis of both eyes 07/15/2022   Erectile dysfunction 09/19/2020   Abnormality of gait 02/13/2017   PCP NOTES >>>>>>>>>>>>>>>>>>>>>>>>>> 01/03/2016   Folliculitis 07/04/2015   Heart murmur 12/23/2014    Annual physical exam 12/22/2013   Snoring 12/22/2013   Diverticulitis s/p lap colectomy [id number] 02/13/2013   ADD (attention deficit disorder)    Insomnia    Fatigue 07/15/2011    Past Medical History:  Diagnosis Date   ADD (attention deficit disorder)    adult- per psych   Adenomatous colon polyp    Allergy     Rag weed   Cancer (HCC)    basal cell skin cancer    Diverticulitis    Perforated sigmoid diverticulitis    Fall 02/2009   fx left hip and wrist   Glaucoma    LEFT eye   Heart murmur    Echo 2003 thickened and somehow myomateous MV, mild MR   HOH (hard of hearing)    Hydrocele 2016   Large, symptomatic left hydrocele, planned hydrocelectomy in 10/2015 with Dr. Matilda   Hyperlipidemia    on meds   Insomnia    per psych   Seasonal allergies    Wears glasses     Past Surgical History:  Procedure Laterality Date   COLON SURGERY  2009?   COLONOSCOPY  2017   JP-MAC-suprep(exc)-TA and benign lymphoid -recall 5 yrs   FRACTURE SURGERY     HIP SURGERY Left 2011   ORIF femur   HYDROCELE EXCISION Left 11/02/2015   Procedure: HYDROCELECTOMY ADULT;  Surgeon: Garnette Matilda, MD;  Location: [facility];  Service: Urology;  Laterality: Left;   LAPAROSCOPIC SIGMOID COLECTOMY N/A 02/11/2013   Procedure: LAPAROSCOPIC SIGMOID COLECTOMY WITH MOBILIZATION SPLENIC  FLEXURE;  Surgeon: Redell Faith, DO;  Location: [facility];  Service: General;  Laterality: N/A;   nose biopsy  2011   Dr Ivin, no cancer   POLYPECTOMY  2017   TA and benign lymphoid   WISDOM TOOTH EXTRACTION     WRIST SURGERY Left 2011    Social History[1]  Family History  Problem Relation Age of Onset   Breast cancer Mother    Heart failure Mother    Lung cancer Father        smoker   Diabetes Neg Hx    Hypertension Neg Hx    Colon cancer Neg Hx    Prostate cancer Neg Hx    Heart attack Neg Hx    Colon polyps Neg Hx    Esophageal cancer Neg Hx    Rectal cancer Neg Hx    Stomach  cancer Neg Hx     Allergies[2]  Medication list has been reviewed and updated.  Medications Ordered Prior to Encounter[3]  Review of Systems:  As per HPI- otherwise negative.   Physical Examination: Vitals:   10/28/24 1120 10/28/24 1125  BP: (!) 155/90 134/74  Pulse: (!) 107   Resp: 16   Temp: 98.3 F (36.8 C)   SpO2: 99%    Vitals:   10/28/24 1120  Weight: 186 lb (84.4 kg)  Height: 6' 1 (1.854 m)   Body mass index is 24.54 kg/m. Ideal Body Weight: Weight in (lb) to have BMI = 25: 189.1  Pulse Readings from Last 3 Encounters:  10/28/24 (!) 107  09/20/24 87  08/23/24 (!) 107    GEN: no acute distress.  Normal weight, looks well  HEENT: Atraumatic, Normocephalic.  Bilateral TM wnl, oropharynx normal.  PEERL,EOMI.  Sinuses are tender to percussion Ears and Nose: No external deformity. CV: RRR, No M/G/R. No JVD. No thrill. No extra heart sounds. PULM: CTA B, no wheezes, crackles, rhonchi. No retractions. No resp. distress. No accessory muscle use. ABD: S, NT, ND, +BS. No rebound. No HSM. EXTR: No c/c/e PSYCH: Normally interactive. Conversant.  Wet sounding cough but lungs are clear   Assessment and Plan: Acute non-recurrent frontal sinusitis - Plan: doxycycline  (VIBRAMYCIN ) 100 MG capsule  Acute bronchitis, unspecified organism - Plan: doxycycline  (VIBRAMYCIN ) 100 MG capsule  Assessment & Plan Acute frontal sinusitis Symptoms and examination suggest sinus infection without fever or ear involvement. - Prescribed doxycycline . - Advised taking doxycycline  with a full glass of water . - Recommended over-the-counter nasal spray and plenty of liquids.  Acute bronchitis Symptoms and examination suggest bronchitis without fever. - Prescribed doxycycline . - Advised taking doxycycline  with a full glass of water . - Recommended over-the-counter nasal spray and plenty of liquids.  General Health Maintenance Has not received flu shot this year; advised due to flu  season. - Advised getting the flu shot as soon as feeling better.  Signed Harlene Schroeder, MD     [1]  Social History Tobacco Use   Smoking status: Never    Passive exposure: Never   Smokeless tobacco: Never  Vaping Use   Vaping status: Never Used  Substance Use Topics   Alcohol use: Not Currently    Alcohol/week: 4.0 standard drinks of alcohol    Types: 4 Standard drinks or equivalent per week    Comment: social   Drug use: No  [2] No Known Allergies [3]  Current Outpatient Medications on File Prior to Visit  Medication Sig Dispense Refill   amoxicillin -clavulanate (AUGMENTIN ) 875-125 MG tablet Take 1 tablet  by mouth 2 (two) times daily. 20 tablet 0   amphetamine -dextroamphetamine  (ADDERALL XR) 30 MG 24 hr capsule Take 1 capsule (30 mg total) by mouth every morning. 30 capsule 0   amphetamine -dextroamphetamine  (ADDERALL) 10 MG tablet Take 1 tablet (10 mg total) by mouth daily as needed. 30 tablet 0   atorvastatin  (LIPITOR) 20 MG tablet Take 1 tablet (20 mg total) by mouth at bedtime. 90 tablet 1   clindamycin  (CLINDAGEL) 1 % gel Apply 1 Application topically daily as needed. 60 g 0   diclofenac  sodium (VOLTAREN ) 1 % GEL Apply 4 g topically 4 (four) times daily as needed. 100 g 0   Multiple Vitamins-Minerals (CENTRUM ULTRA MENS) TABS Take 1 tablet by mouth 3 (three) times a week. -Monday, Wednesday, Friday     Olopatadine -Mometasone (RYALTRIS ) 665-25 MCG/ACT SUSP Place 1-2 sprays into the nose in the morning and at bedtime. 29 g 11   sildenafil  (REVATIO ) 20 MG tablet TAKE 3 TO 4 TABLETS BY MOUTH AT BEDTIME AS NEEDED 30 tablet 3   traZODone  (DESYREL ) 100 MG tablet Take 0.5-1 tablets (50-100 mg total) by mouth at bedtime as needed for sleep. 90 tablet 1   No current facility-administered medications on file prior to visit.

## 2024-10-28 NOTE — Patient Instructions (Signed)
 We will treat you with doxycycline  for sinusitis and bronchitis Please let me know if you are not feeling better in the next few days- Sooner if worse.   Recommend flu shot once you are feeling better- do NOT have to finish the rx first

## 2024-10-31 ENCOUNTER — Other Ambulatory Visit: Payer: Self-pay | Admitting: Internal Medicine

## 2024-11-01 ENCOUNTER — Encounter: Payer: Self-pay | Admitting: Family Medicine

## 2024-11-01 DIAGNOSIS — J209 Acute bronchitis, unspecified: Secondary | ICD-10-CM

## 2024-11-01 DIAGNOSIS — J329 Chronic sinusitis, unspecified: Secondary | ICD-10-CM

## 2024-11-01 MED ORDER — PREDNISONE 20 MG PO TABS: ORAL_TABLET | ORAL | 0 refills | Status: DC

## 2024-11-08 MED ORDER — AMOXICILLIN-POT CLAVULANATE 875-125 MG PO TABS: 1.0000 | ORAL_TABLET | Freq: Two times a day (BID) | ORAL | 0 refills | Status: DC

## 2024-11-08 NOTE — Addendum Note (Signed)
 Addended by: WATT RAISIN C on: 11/08/2024 12:14 PM   Modules accepted: Orders

## 2024-11-12 ENCOUNTER — Ambulatory Visit (HOSPITAL_BASED_OUTPATIENT_CLINIC_OR_DEPARTMENT_OTHER)
Admission: RE | Admit: 2024-11-12 | Discharge: 2024-11-12 | Disposition: A | Discharge status: Home / Self Care | Source: Ambulatory Visit | Attending: Family Medicine | Admitting: Family Medicine

## 2024-11-12 DIAGNOSIS — J209 Acute bronchitis, unspecified: Secondary | ICD-10-CM | POA: Insufficient documentation

## 2024-11-12 NOTE — Addendum Note (Signed)
 Addended by: WATT RAISIN C on: 11/12/2024 01:05 PM   Modules accepted: Orders

## 2024-11-13 NOTE — Addendum Note (Signed)
 Addended by: WATT RAISIN C on: 11/13/2024 08:01 AM   Modules accepted: Orders

## 2024-11-26 ENCOUNTER — Ambulatory Visit (HOSPITAL_BASED_OUTPATIENT_CLINIC_OR_DEPARTMENT_OTHER)
Admission: RE | Admit: 2024-11-26 | Discharge: 2024-11-26 | Disposition: A | Source: Ambulatory Visit | Attending: Family Medicine | Admitting: Family Medicine

## 2024-11-26 DIAGNOSIS — J329 Chronic sinusitis, unspecified: Secondary | ICD-10-CM | POA: Insufficient documentation

## 2024-12-04 ENCOUNTER — Encounter: Payer: Self-pay | Admitting: Family Medicine

## 2024-12-10 ENCOUNTER — Ambulatory Visit: Admitting: Internal Medicine

## 2024-12-10 VITALS — BP 134/80 | HR 82 | Temp 97.9°F | Resp 16 | Ht 73.0 in | Wt 187.2 lb

## 2024-12-10 DIAGNOSIS — F988 Other specified behavioral and emotional disorders with onset usually occurring in childhood and adolescence: Secondary | ICD-10-CM | POA: Diagnosis not present

## 2024-12-10 DIAGNOSIS — E785 Hyperlipidemia, unspecified: Secondary | ICD-10-CM

## 2024-12-10 DIAGNOSIS — R635 Abnormal weight gain: Secondary | ICD-10-CM

## 2024-12-10 DIAGNOSIS — E038 Other specified hypothyroidism: Secondary | ICD-10-CM

## 2024-12-10 DIAGNOSIS — Z79899 Other long term (current) drug therapy: Secondary | ICD-10-CM | POA: Diagnosis not present

## 2024-12-10 DIAGNOSIS — Z23 Encounter for immunization: Secondary | ICD-10-CM | POA: Diagnosis not present

## 2024-12-10 DIAGNOSIS — Z125 Encounter for screening for malignant neoplasm of prostate: Secondary | ICD-10-CM | POA: Diagnosis not present

## 2024-12-10 MED ORDER — AMPHETAMINE-DEXTROAMPHET ER 30 MG PO CP24: 30.0000 mg | ORAL_CAPSULE | ORAL | 0 refills | Status: AC

## 2024-12-10 MED ORDER — AMPHETAMINE-DEXTROAMPHETAMINE 10 MG PO TABS: 10.0000 mg | ORAL_TABLET | Freq: Every day | ORAL | 0 refills | Status: AC | PRN

## 2024-12-10 NOTE — Telephone Encounter (Signed)
 CT results have been faxed to pts dental office as requested.

## 2024-12-10 NOTE — Patient Instructions (Signed)
 Please read your instructions carefully.   GO TO THE LAB :  Get the blood work    Go to the front desk for the checkout Please make an appointment for a checkup in 1 year   You got the flu shot and a pneumonia shot today. Please consider a COVID-vaccine

## 2024-12-10 NOTE — Progress Notes (Unsigned)
 "  Subjective:    Patient ID: [name], male    DOB: Aug 06, 1956, 69 y.o.   MRN: [id number]  DOS:  12/10/2024 Routine checkup  Discussed the use of AI scribe software for clinical note transcription with the patient, who gave verbal consent to proceed.  History of Present Illness [name] is a 69 year old male who presents for an annual physical exam.  Chronic sinusitis and nasal abnormalities - Prolonged sinus infection prior to Christmas unresponsive to doxycycline  and prednisone , resolved with Augmentin  - CT of nasal cavity performed during infection - Deviated septum and chronic sinus issues - No current or new sinus symptoms  Weight gain and lifestyle - Desires to lose approximately fifteen pounds - Recent weight gain attributed to aging - Remains physically active, walks regularly, and plays golf - Follows a self-described healthy diet  General symptoms - No fever, chest pain, cough, nausea, vomiting, blood in stools, or urinary problems  Medication use - Adderall as needed, finds it helpful - Trazodone  for sleep - Atorvastatin  80 mg for cholesterol management    Review of Systems See above   Past Medical History:  Diagnosis Date   ADD (attention deficit disorder)    adult- per psych   Adenomatous colon polyp    Allergy     Rag weed   Cancer (HCC)    basal cell skin cancer    Diverticulitis    Perforated sigmoid diverticulitis    Fall 02/2009   fx left hip and wrist   Glaucoma    LEFT eye   Heart murmur    Echo 2003 thickened and somehow myomateous MV, mild MR   HOH (hard of hearing)    Hydrocele 2016   Large, symptomatic left hydrocele, planned hydrocelectomy in 10/2015 with Dr. Matilda   Hyperlipidemia    on meds   Insomnia    per psych   Seasonal allergies    Wears glasses     Past Surgical History:  Procedure Laterality Date   COLON SURGERY  2009?   COLONOSCOPY  2017   JP-MAC-suprep(exc)-TA and benign lymphoid -recall 5 yrs    FRACTURE SURGERY     HIP SURGERY Left 2011   ORIF femur   HYDROCELE EXCISION Left 11/02/2015   Procedure: HYDROCELECTOMY ADULT;  Surgeon: Garnette Matilda, MD;  Location: [facility];  Service: Urology;  Laterality: Left;   LAPAROSCOPIC SIGMOID COLECTOMY N/A 02/11/2013   Procedure: LAPAROSCOPIC SIGMOID COLECTOMY WITH MOBILIZATION SPLENIC FLEXURE;  Surgeon: Redell Faith, DO;  Location: [facility];  Service: General;  Laterality: N/A;   nose biopsy  2011   Dr Ivin, no cancer   POLYPECTOMY  2017   TA and benign lymphoid   WISDOM TOOTH EXTRACTION     WRIST SURGERY Left 2011    Current Outpatient Medications  Medication Instructions   amoxicillin -clavulanate (AUGMENTIN ) 875-125 MG tablet 1 tablet, Oral, 2 times daily   amphetamine -dextroamphetamine  (ADDERALL XR) 30 MG 24 hr capsule 30 mg, Oral, BH-each morning   amphetamine -dextroamphetamine  (ADDERALL) 10 MG tablet 10 mg, Oral, Daily PRN   atorvastatin  (LIPITOR) 20 mg, Oral, Daily at bedtime   clindamycin  (CLINDAGEL) 1 % gel 1 Application, Topical, Daily PRN   diclofenac  sodium (VOLTAREN ) 4 g, Topical, 4 times daily PRN   doxycycline  (VIBRAMYCIN ) 100 mg, Oral, 2 times daily   Multiple Vitamins-Minerals (CENTRUM ULTRA MENS) TABS 1 tablet, 3 times weekly   Olopatadine -Mometasone (RYALTRIS ) 665-25 MCG/ACT SUSP 1-2 sprays, Nasal, 2 times daily   predniSONE  (  DELTASONE ) 20 MG tablet Take 40 mg by mouth daily for 3 days, then take 20 mg by mouth daily for 3 more days if needed   sildenafil  (REVATIO ) 20 MG tablet TAKE 3 TO 4 TABLETS BY MOUTH AT BEDTIME AS NEEDED   traZODone  (DESYREL ) 50-100 mg, Oral, At bedtime PRN       Objective:   Physical Exam BP 134/80   Pulse 82   Temp 97.9 F (36.6 C) (Oral)   Resp 16   Ht 6' 1 (1.854 m)   Wt 187 lb 4 oz (84.9 kg)   SpO2 97%   BMI 24.70 kg/m  General: Well developed, NAD, BMI noted Neck: No  thyromegaly  HEENT:  Normocephalic . Face symmetric, atraumatic Lungs:  CTA  B Normal respiratory effort, no intercostal retractions, no accessory muscle use. Heart: RRR,  no murmur.  Abdomen:  Not distended, soft, non-tender. No rebound or rigidity.   Lower extremities: no pretibial edema bilaterally  Skin: Exposed areas without rash. Not pale. Not jaundice Neurologic:  alert & oriented X3.  Speech normal, gait appropriate for age and unassisted Strength symmetric and appropriate for age.  Psych: Cognition and judgment appear intact.  Cooperative with normal attention span and concentration.  Behavior appropriate. No anxious or depressed appearing.     Assessment   ASSESSMENT Dyslipidemia  ADD,  Insomnia per  [facility] >>> Rx transferred to PCP 09-2019 Snoring palpable aorta: CT 2014 no AAA H/o diverticulitis, perforated, sigmoid colectomy 2014 H/o Hydrocele 2016, large, symptomatic, saw urology, excised H/o BCC, sees derm Barbae folliculitis, previously dermatology prescribed Ziana, now on Flagyl  as needed H/o  heart murmur, echo 2013 --thick MV, mild MR    Assessment & Plan High cholesterol: On atorvastatin , checking labs  Attention Deficit Disorder (ADD) Takes Adderall and Adderall XR as needed with good results.  Request a refill, PDMP reviewed, prescription sent.  Will get a UDS and a contract. Insomnia Continues trazodone  use for sleep without issues.  RF as needed Preventive care reviewed: -Tdap 2020 - zostavax --12-2015; s/p shingrex   - PNM 23: 2021. PNM 20 today - Flu shot today - Rec; covid vax .  Pros>> cons  --CCs: *cscope 05/2013--Dr Perry--multiple adenomas *cscope again 05-2016, 5 years *C-scope 09-2021, no polyps, 5 years per colonoscopy report. --Prostate ca screening : No symptoms,  check PSA.   RTC 1 year       "

## 2024-12-11 LAB — COMPREHENSIVE METABOLIC PANEL WITH GFR
AG Ratio: 1.8 (calc) (ref 1.0–2.5)
ALT: 26 U/L (ref 9–46)
AST: 26 U/L (ref 10–35)
Albumin: 4.4 g/dL (ref 3.6–5.1)
Alkaline phosphatase (APISO): 60 U/L (ref 35–144)
BUN: 17 mg/dL (ref 7–25)
CO2: 26 mmol/L (ref 20–32)
Calcium: 9.4 mg/dL (ref 8.6–10.3)
Chloride: 105 mmol/L (ref 98–110)
Creat: 0.83 mg/dL (ref 0.70–1.35)
Globulin: 2.5 g/dL (ref 1.9–3.7)
Glucose, Bld: 92 mg/dL (ref 65–99)
Potassium: 4.2 mmol/L (ref 3.5–5.3)
Sodium: 140 mmol/L (ref 135–146)
Total Bilirubin: 0.9 mg/dL (ref 0.2–1.2)
Total Protein: 6.9 g/dL (ref 6.1–8.1)
eGFR: 95 mL/min/{1.73_m2}

## 2024-12-11 LAB — TSH: TSH: 3.28 m[IU]/L (ref 0.40–4.50)

## 2024-12-11 LAB — LIPID PANEL
Cholesterol: 156 mg/dL
HDL: 78 mg/dL
LDL Cholesterol (Calc): 63 mg/dL
Non-HDL Cholesterol (Calc): 78 mg/dL
Total CHOL/HDL Ratio: 2 (calc)
Triglycerides: 71 mg/dL

## 2024-12-11 LAB — T4, FREE: Free T4: 1.2 ng/dL (ref 0.8–1.8)

## 2024-12-11 LAB — CBC WITH DIFFERENTIAL/PLATELET
Absolute Lymphocytes: 2155 {cells}/uL (ref 850–3900)
Absolute Monocytes: 687 {cells}/uL (ref 200–950)
Basophils Absolute: 139 {cells}/uL (ref 0–200)
Basophils Relative: 2.2 %
Eosinophils Absolute: 101 {cells}/uL (ref 15–500)
Eosinophils Relative: 1.6 %
HCT: 44.9 % (ref 39.4–51.1)
Hemoglobin: 14.8 g/dL (ref 13.2–17.1)
MCH: 31.4 pg (ref 27.0–33.0)
MCHC: 33 g/dL (ref 31.6–35.4)
MCV: 95.3 fL (ref 81.4–101.7)
MPV: 10 fL (ref 7.5–12.5)
Monocytes Relative: 10.9 %
Neutro Abs: 3219 {cells}/uL (ref 1500–7800)
Neutrophils Relative %: 51.1 %
Platelets: 246 10*3/uL (ref 140–400)
RBC: 4.71 Million/uL (ref 4.20–5.80)
RDW: 12.6 % (ref 11.0–15.0)
Total Lymphocyte: 34.2 %
WBC: 6.3 10*3/uL (ref 3.8–10.8)

## 2024-12-11 LAB — PSA: PSA: 2.66 ng/mL

## 2024-12-11 NOTE — Assessment & Plan Note (Signed)
 High cholesterol: On atorvastatin , checking labs  Attention Deficit Disorder (ADD) Takes Adderall and Adderall XR as needed with good results.  Request a refill, PDMP reviewed, prescription sent.  Will get a UDS and a contract. Insomnia Continues trazodone  use for sleep without issues.  RF as needed Weight gain: Concerned about weight gain, on chart review very small weight gain in the last 2 to 3 years.  BMI satisfactory.  Nevertheless he plans a day encouraged to eat healthy. Preventive care reviewed: -Tdap 2020 - zostavax --12-2015; s/p shingrex   - PNM 23: 2021. PNM 20 today - Flu shot today - Rec; covid vax .  Pros>> cons  --CCs: *cscope 05/2013--Dr Perry--multiple adenomas *cscope again 05-2016, 5 years *C-scope 09-2021, no polyps, 5 years per colonoscopy report. --Prostate ca screening : No symptoms,  check PSA.   RTC 1 year

## 2024-12-13 ENCOUNTER — Ambulatory Visit: Payer: Self-pay | Admitting: Internal Medicine

## 2024-12-13 LAB — DRUG MONITORING PANEL 375977 , URINE
Alcohol Metabolites: NEGATIVE ng/mL
Amphetamines: NEGATIVE ng/mL
Barbiturates: NEGATIVE ng/mL
Benzodiazepines: NEGATIVE ng/mL
Cocaine Metabolite: NEGATIVE ng/mL
Desmethyltramadol: NEGATIVE ng/mL
Marijuana Metabolite: NEGATIVE ng/mL
Opiates: NEGATIVE ng/mL
Oxycodone: NEGATIVE ng/mL
Tramadol: NEGATIVE ng/mL

## 2024-12-13 LAB — DM TEMPLATE

## 2024-12-21 ENCOUNTER — Other Ambulatory Visit: Payer: Self-pay | Admitting: Internal Medicine

## 2025-12-12 ENCOUNTER — Ambulatory Visit: Admitting: Internal Medicine
